# Patient Record
Sex: Female | Born: 1965 | Race: White | Hispanic: No | Marital: Single | Smoking: Never smoker
Health system: Southern US, Community
[De-identification: ages and names within clinical notes are randomized; demographics above are authoritative.]

## PROBLEM LIST (undated history)

## (undated) DIAGNOSIS — L309 Dermatitis, unspecified: Secondary | ICD-10-CM

## (undated) DIAGNOSIS — N63 Unspecified lump in unspecified breast: Secondary | ICD-10-CM

## (undated) DIAGNOSIS — R319 Hematuria, unspecified: Secondary | ICD-10-CM

## (undated) DIAGNOSIS — I10 Essential (primary) hypertension: Secondary | ICD-10-CM

## (undated) DIAGNOSIS — E785 Hyperlipidemia, unspecified: Secondary | ICD-10-CM

## (undated) DIAGNOSIS — K219 Gastro-esophageal reflux disease without esophagitis: Secondary | ICD-10-CM

## (undated) DIAGNOSIS — E8881 Metabolic syndrome: Secondary | ICD-10-CM

## (undated) DIAGNOSIS — R3129 Other microscopic hematuria: Secondary | ICD-10-CM

## (undated) DIAGNOSIS — N200 Calculus of kidney: Secondary | ICD-10-CM

## (undated) DIAGNOSIS — T7840XA Allergy, unspecified, initial encounter: Secondary | ICD-10-CM

## (undated) DIAGNOSIS — D649 Anemia, unspecified: Secondary | ICD-10-CM

## (undated) HISTORY — DX: Anemia, unspecified: D64.9

## (undated) HISTORY — DX: Hematuria, unspecified: R31.9

## (undated) HISTORY — DX: Metabolic syndrome: E88.810

## (undated) HISTORY — DX: Calculus of kidney: N20.0

## (undated) HISTORY — DX: Other microscopic hematuria: R31.29

## (undated) HISTORY — DX: Hyperlipidemia, unspecified: E78.5

## (undated) HISTORY — DX: Unspecified lump in unspecified breast: N63.0

## (undated) HISTORY — DX: Metabolic syndrome: E88.81

## (undated) HISTORY — DX: Dermatitis, unspecified: L30.9

## (undated) HISTORY — DX: Gastro-esophageal reflux disease without esophagitis: K21.9

## (undated) HISTORY — PX: URETHRAL DILATION: SUR417

## (undated) HISTORY — DX: Essential (primary) hypertension: I10

## (undated) HISTORY — DX: Allergy, unspecified, initial encounter: T78.40XA

---

## 2005-07-07 ENCOUNTER — Ambulatory Visit: Payer: Self-pay | Admitting: Internal Medicine

## 2005-12-29 ENCOUNTER — Ambulatory Visit: Payer: Self-pay | Admitting: General Practice

## 2006-01-29 ENCOUNTER — Ambulatory Visit: Payer: Self-pay | Admitting: General Practice

## 2006-02-28 ENCOUNTER — Ambulatory Visit: Payer: Self-pay | Admitting: General Practice

## 2006-03-31 ENCOUNTER — Ambulatory Visit: Payer: Self-pay | Admitting: General Practice

## 2006-05-01 ENCOUNTER — Ambulatory Visit: Payer: Self-pay | Admitting: General Practice

## 2006-05-11 ENCOUNTER — Encounter: Payer: Self-pay | Admitting: General Practice

## 2006-05-31 ENCOUNTER — Encounter: Payer: Self-pay | Admitting: General Practice

## 2006-07-01 ENCOUNTER — Encounter: Payer: Self-pay | Admitting: General Practice

## 2006-07-13 ENCOUNTER — Ambulatory Visit: Payer: Self-pay | Admitting: Internal Medicine

## 2006-09-17 ENCOUNTER — Ambulatory Visit: Payer: Self-pay | Admitting: Internal Medicine

## 2006-12-01 ENCOUNTER — Ambulatory Visit: Payer: Self-pay | Admitting: Unknown Physician Specialty

## 2007-02-04 ENCOUNTER — Ambulatory Visit: Payer: Self-pay | Admitting: Internal Medicine

## 2007-05-10 ENCOUNTER — Ambulatory Visit: Payer: Self-pay | Admitting: Internal Medicine

## 2007-08-31 ENCOUNTER — Ambulatory Visit: Payer: Self-pay

## 2008-04-20 ENCOUNTER — Ambulatory Visit: Payer: Self-pay | Admitting: Internal Medicine

## 2008-05-01 ENCOUNTER — Ambulatory Visit: Payer: Self-pay | Admitting: Internal Medicine

## 2008-06-08 ENCOUNTER — Ambulatory Visit: Payer: Self-pay | Admitting: Internal Medicine

## 2008-10-16 ENCOUNTER — Ambulatory Visit: Payer: Self-pay

## 2008-10-18 ENCOUNTER — Ambulatory Visit: Payer: Self-pay | Admitting: Internal Medicine

## 2008-10-26 ENCOUNTER — Ambulatory Visit: Payer: Self-pay | Admitting: Internal Medicine

## 2008-12-20 ENCOUNTER — Ambulatory Visit: Payer: Self-pay | Admitting: Internal Medicine

## 2009-01-09 ENCOUNTER — Ambulatory Visit: Payer: Self-pay | Admitting: Internal Medicine

## 2009-02-14 ENCOUNTER — Ambulatory Visit: Payer: Self-pay | Admitting: Internal Medicine

## 2009-04-23 ENCOUNTER — Other Ambulatory Visit: Payer: Self-pay | Admitting: Internal Medicine

## 2010-05-31 LAB — HM PAP SMEAR: HM PAP: NORMAL

## 2010-06-04 DIAGNOSIS — N63 Unspecified lump in unspecified breast: Secondary | ICD-10-CM | POA: Insufficient documentation

## 2010-06-04 DIAGNOSIS — R3129 Other microscopic hematuria: Secondary | ICD-10-CM | POA: Insufficient documentation

## 2010-06-05 ENCOUNTER — Ambulatory Visit: Payer: Self-pay | Admitting: Family Medicine

## 2011-08-03 ENCOUNTER — Ambulatory Visit: Payer: Self-pay | Admitting: General Practice

## 2011-08-17 ENCOUNTER — Ambulatory Visit: Payer: Self-pay | Admitting: Family Medicine

## 2011-09-01 ENCOUNTER — Ambulatory Visit: Payer: Self-pay | Admitting: General Practice

## 2011-10-02 ENCOUNTER — Ambulatory Visit: Payer: Self-pay | Admitting: General Practice

## 2011-10-30 ENCOUNTER — Ambulatory Visit: Payer: Self-pay | Admitting: General Practice

## 2011-12-03 ENCOUNTER — Ambulatory Visit: Payer: Self-pay | Admitting: General Practice

## 2011-12-30 ENCOUNTER — Ambulatory Visit: Payer: Self-pay | Admitting: General Practice

## 2012-02-04 ENCOUNTER — Ambulatory Visit: Payer: Self-pay | Admitting: General Practice

## 2012-02-29 ENCOUNTER — Ambulatory Visit: Payer: Self-pay | Admitting: General Practice

## 2012-08-22 ENCOUNTER — Ambulatory Visit: Payer: Self-pay | Admitting: Family Medicine

## 2013-08-28 ENCOUNTER — Ambulatory Visit: Payer: Self-pay | Admitting: Family Medicine

## 2013-08-28 LAB — HM MAMMOGRAPHY: HM Mammogram: NORMAL

## 2013-11-02 LAB — HEMOGLOBIN A1C: Hgb A1c MFr Bld: 5.6 % (ref 4.0–6.0)

## 2013-11-02 LAB — LIPID PANEL
Cholesterol: 194 mg/dL (ref 0–200)
HDL: 42 mg/dL (ref 35–70)
LDL CALC: 129 mg/dL
Triglycerides: 115 mg/dL (ref 40–160)

## 2014-06-04 ENCOUNTER — Ambulatory Visit: Payer: Self-pay | Admitting: Urology

## 2015-02-24 ENCOUNTER — Encounter: Payer: Self-pay | Admitting: Family Medicine

## 2015-02-24 DIAGNOSIS — E8881 Metabolic syndrome: Secondary | ICD-10-CM | POA: Insufficient documentation

## 2015-02-24 DIAGNOSIS — K219 Gastro-esophageal reflux disease without esophagitis: Secondary | ICD-10-CM | POA: Insufficient documentation

## 2015-02-24 DIAGNOSIS — Z862 Personal history of diseases of the blood and blood-forming organs and certain disorders involving the immune mechanism: Secondary | ICD-10-CM | POA: Insufficient documentation

## 2015-02-24 DIAGNOSIS — E785 Hyperlipidemia, unspecified: Secondary | ICD-10-CM | POA: Insufficient documentation

## 2015-02-24 DIAGNOSIS — I10 Essential (primary) hypertension: Secondary | ICD-10-CM | POA: Insufficient documentation

## 2015-02-24 DIAGNOSIS — E66811 Obesity, class 1: Secondary | ICD-10-CM | POA: Insufficient documentation

## 2015-02-24 DIAGNOSIS — E669 Obesity, unspecified: Secondary | ICD-10-CM | POA: Insufficient documentation

## 2015-02-24 DIAGNOSIS — L309 Dermatitis, unspecified: Secondary | ICD-10-CM | POA: Insufficient documentation

## 2015-02-24 DIAGNOSIS — M5440 Lumbago with sciatica, unspecified side: Secondary | ICD-10-CM | POA: Insufficient documentation

## 2015-02-24 DIAGNOSIS — E611 Iron deficiency: Secondary | ICD-10-CM | POA: Insufficient documentation

## 2015-02-24 DIAGNOSIS — J309 Allergic rhinitis, unspecified: Secondary | ICD-10-CM | POA: Insufficient documentation

## 2015-02-27 ENCOUNTER — Ambulatory Visit (INDEPENDENT_AMBULATORY_CARE_PROVIDER_SITE_OTHER): Payer: 59 | Admitting: Family Medicine

## 2015-02-27 ENCOUNTER — Encounter: Payer: Self-pay | Admitting: Family Medicine

## 2015-02-27 ENCOUNTER — Other Ambulatory Visit: Payer: Self-pay | Admitting: Family Medicine

## 2015-02-27 VITALS — BP 120/74 | HR 83 | Temp 97.8°F | Resp 16 | Ht 63.25 in | Wt 180.4 lb

## 2015-02-27 DIAGNOSIS — Z01419 Encounter for gynecological examination (general) (routine) without abnormal findings: Secondary | ICD-10-CM

## 2015-02-27 DIAGNOSIS — E611 Iron deficiency: Secondary | ICD-10-CM

## 2015-02-27 DIAGNOSIS — Z1239 Encounter for other screening for malignant neoplasm of breast: Secondary | ICD-10-CM

## 2015-02-27 DIAGNOSIS — Z0000 Encounter for general adult medical examination without abnormal findings: Secondary | ICD-10-CM

## 2015-02-27 DIAGNOSIS — E785 Hyperlipidemia, unspecified: Secondary | ICD-10-CM

## 2015-02-27 DIAGNOSIS — Z124 Encounter for screening for malignant neoplasm of cervix: Secondary | ICD-10-CM

## 2015-02-27 DIAGNOSIS — I10 Essential (primary) hypertension: Secondary | ICD-10-CM

## 2015-02-27 DIAGNOSIS — E8881 Metabolic syndrome: Secondary | ICD-10-CM | POA: Diagnosis not present

## 2015-02-27 DIAGNOSIS — R5383 Other fatigue: Secondary | ICD-10-CM

## 2015-02-27 MED ORDER — METOPROLOL SUCCINATE ER 25 MG PO TB24: 25.0000 mg | ORAL_TABLET | Freq: Every day | ORAL | Status: DC

## 2015-02-27 NOTE — Patient Instructions (Signed)
Discussed importance of 150 minutes of physical activity weekly, eat two servings of fish weekly, eat one serving of tree nuts ( cashews, pistachios, pecans, almonds..) every other day, eat 6 servings of fruit/vegetables daily and drink plenty of water and avoid sweet beverages. 

## 2015-02-27 NOTE — Progress Notes (Signed)
Name: Sydney [name]   MRN: [id number]    DOB: Oct 08, 1965   Date:02/27/2015       Progress Note  Subjective  Chief Complaint  Chief Complaint  Patient presents with  . Annual Exam    HPI  Well Woman: she is feeling well. She joined [employer] today, trying to lose weight. Mild low back pain started yesterday no radiculitis, sees chiropractor prn. She has a history of menorrhagia and iron deficiency anemia, but no cycles since April 2016 off supplementations. She has been very busy and is feeling tired.   Patient Active Problem List   Diagnosis Date Noted  . Low back pain with sciatica 02/24/2015  . Dyslipidemia 02/24/2015  . Dermatitis, eczematoid 02/24/2015  . Acid reflux 02/24/2015  . Benign hypertension 02/24/2015  . Iron deficiency 02/24/2015  . Dysmetabolic syndrome 31/49/7026  . Obesity (BMI 30.0-34.9) 02/24/2015  . Allergic rhinitis 02/24/2015  . Hematuria, microscopic 06/04/2010    Past Surgical History  Procedure Laterality Date  . Urethral dilation  Age 20    Family History  Problem Relation Age of Onset  . Hypertension Mother   . Hyperlipidemia Mother   . Cancer Father     Prostate, Multiple Myeloma  . Diabetes Father   . CAD Father   . Psoriasis Father   . Asthma Sister   . Thyroid disease Sister   . Diabetes Sister     History   Social History  . Marital Status: Single    Spouse Name: N/A  . Number of Children: N/A  . Years of Education: N/A   Occupational History  . Not on file.   Social History Main Topics  . Smoking status: Never Smoker   . Smokeless tobacco: Never Used  . Alcohol Use: No  . Drug Use: No  . Sexual Activity: Not Currently   Other Topics Concern  . Not on file   Social History Narrative     Current outpatient prescriptions:  .  B COMPLEX VITAMINS ER PO, Take by mouth., Disp: , Rfl:  .  Clindamycin-Benzoyl Per, Refr, (DUAC) gel, DUAC, 1-5% (External Gel) - Historical Medication  apply daily (1-5 %)  Active Comments: Dr. Tyler Deis, Disp: , Rfl:  .  Cranberry Fruit 475 MG CAPS, Take by mouth., Disp: , Rfl:  .  fluticasone (FLONASE) 50 MCG/ACT nasal spray, Place 2 sprays into the nose at bedtime., Disp: , Rfl:  .  Lutein 20 MG TABS, Take 1 tablet by mouth daily., Disp: , Rfl:  .  metoprolol succinate (TOPROL-XL) 25 MG 24 hr tablet, Take 1 tablet (25 mg total) by mouth daily., Disp: 90 tablet, Rfl: 1 .  Multiple Minerals-Vitamins (CALCIUM CITRATE PLUS) TABS, Take 1 tablet by mouth daily., Disp: , Rfl:  .  Multiple Vitamins-Minerals (CENTRUM ULTRA WOMENS) TABS, Take 1 tablet by mouth daily., Disp: , Rfl:   Allergies  Allergen Reactions  . Amoxicillin   . Sulfa Antibiotics      ROS  Constitutional: Negative for fever or significant weight change.  Respiratory: Negative for cough and shortness of breath.   Cardiovascular: Negative for chest pain or palpitations.  Gastrointestinal: Negative for abdominal pain, no bowel changes.  Musculoskeletal: Negative for gait problem or joint swelling. Back pain  Skin: Negative for rash.  Neurological: Negative for dizziness or headache.  No other specific complaints in a complete review of systems (except as listed in HPI above).   Objective  Filed Vitals:   02/27/15 1159  BP: 120/74  Pulse: 83  Temp: 97.8 F (36.6 C)  TempSrc: Oral  Resp: 16  Height: 5' 3.25" (1.607 m)  Weight: 180 lb 6.4 oz (81.829 kg)  SpO2: 97%    Body mass index is 31.69 kg/(m^2).  Physical Exam  Constitutional: Patient appears well-developed and well-nourished. No distress.  HENT: Head: Normocephalic and atraumatic. Ears: B TMs ok, no erythema or effusion; Nose: Nose normal. Mouth/Throat: Oropharynx is clear and moist. No oropharyngeal exudate.  Eyes: Conjunctivae and EOM are normal. Pupils are equal, round, and reactive to light. No scleral icterus.  Neck: Normal range of motion. Neck supple. No JVD present. No thyromegaly present.  Cardiovascular: Normal  rate, regular rhythm and normal heart sounds.  No murmur heard. No BLE edema. Pulmonary/Chest: Effort normal and breath sounds normal. No respiratory distress. Abdominal: Soft. Bowel sounds are normal, no distension. There is no tenderness. no masses Breast: no lumps or masses, no nipple discharge or rashes FEMALE GENITALIA:  External genitalia normal External urethra normal Vaginal vault normal without discharge or lesions Cervix normal without discharge or lesions Bimanual exam normal without masses RECTAL:  hemorrhoids Musculoskeletal: Normal range of motion, no joint effusions. No gross deformities Neurological: he is alert and oriented to person, place, and time. No cranial nerve deficit. Coordination, balance, strength, speech and gait are normal.  Skin: Skin is warm and dry. No rash noted. No erythema.  Psychiatric: Patient has a normal mood and affect. behavior is normal. Judgment and thought content normal.    PHQ2/9: Depression screen PHQ 2/9 02/27/2015  Decreased Interest 0  Down, Depressed, Hopeless 0  PHQ - 2 Score 0    Fall Risk: Fall Risk  02/27/2015  Falls in the past year? No     Assessment & Plan  1. Well woman exam Discussed importance of 150 minutes of physical activity weekly, eat two servings of fish weekly, eat one serving of tree nuts ( cashews, pistachios, pecans, almonds.Marland Kitchen) every other day, eat 6 servings of fruit/vegetables daily and drink plenty of water and avoid sweet beverages.   2. Cervical cancer screening  - Cytology - PAP  3. Breast cancer screening  - MM Digital Screening; Future  4. Dyslipidemia  - Lipid Profile  5. Benign hypertension  - metoprolol succinate (TOPROL-XL) 25 MG 24 hr tablet; Take 1 tablet (25 mg total) by mouth daily.  Dispense: 90 tablet; Refill: 1  6. Iron deficiency  - CBC with Differential - Comprehensive Metabolic Panel (CMET) - Iron - Iron Binding Cap (TIBC) - Ferritin  7. Dysmetabolic syndrome  -  HgB A1c  8. Other fatigue  - Vitamin D (25 hydroxy) - TSH

## 2015-03-06 LAB — PAP LB, HPV-H+LR
HPV DNA High Risk: NEGATIVE
HPV DNA Low Risk: NEGATIVE
PAP Smear Comment: 0

## 2015-03-07 ENCOUNTER — Telehealth: Payer: Self-pay

## 2015-03-07 NOTE — Telephone Encounter (Signed)
Vm was full unable to leave message about patient pap smear.

## 2015-03-07 NOTE — Telephone Encounter (Signed)
-----   Message from Steele Sizer, MD sent at 03/07/2015  1:24 PM EDT ----- Pap smear within normal limits Please notify patient, thank you

## 2015-03-11 NOTE — Progress Notes (Signed)
Patient notified

## 2015-03-28 ENCOUNTER — Ambulatory Visit
Admission: RE | Admit: 2015-03-28 | Discharge: 2015-03-28 | Disposition: A | Discharge status: Home / Self Care | Payer: 59 | Source: Ambulatory Visit | Attending: Family Medicine | Admitting: Family Medicine

## 2015-03-28 DIAGNOSIS — Z1231 Encounter for screening mammogram for malignant neoplasm of breast: Secondary | ICD-10-CM | POA: Insufficient documentation

## 2015-03-28 DIAGNOSIS — Z1239 Encounter for other screening for malignant neoplasm of breast: Secondary | ICD-10-CM

## 2015-03-29 ENCOUNTER — Other Ambulatory Visit: Payer: Self-pay

## 2015-03-29 ENCOUNTER — Telehealth: Payer: Self-pay

## 2015-03-29 DIAGNOSIS — N200 Calculus of kidney: Secondary | ICD-10-CM

## 2015-03-29 LAB — COMPREHENSIVE METABOLIC PANEL
A/G RATIO: 1.8 (ref 1.1–2.5)
ALT: 15 IU/L (ref 0–32)
AST: 16 IU/L (ref 0–40)
Albumin: 4.3 g/dL (ref 3.5–5.5)
Alkaline Phosphatase: 80 IU/L (ref 39–117)
BUN/Creatinine Ratio: 28 — ABNORMAL HIGH (ref 9–23)
BUN: 19 mg/dL (ref 6–24)
Bilirubin Total: 0.4 mg/dL (ref 0.0–1.2)
CHLORIDE: 100 mmol/L (ref 97–108)
CO2: 23 mmol/L (ref 18–29)
Calcium: 8.9 mg/dL (ref 8.7–10.2)
Creatinine, Ser: 0.69 mg/dL (ref 0.57–1.00)
GFR calc Af Amer: 119 mL/min/{1.73_m2} (ref >59)
GFR, EST NON AFRICAN AMERICAN: 103 mL/min/{1.73_m2} (ref >59)
GLUCOSE: 92 mg/dL (ref 65–99)
Globulin, Total: 2.4 g/dL (ref 1.5–4.5)
POTASSIUM: 4.1 mmol/L (ref 3.5–5.2)
Sodium: 140 mmol/L (ref 134–144)
Total Protein: 6.7 g/dL (ref 6.0–8.5)

## 2015-03-29 LAB — VITAMIN D 25 HYDROXY (VIT D DEFICIENCY, FRACTURES): Vit D, 25-Hydroxy: 37.4 ng/mL (ref 30.0–100.0)

## 2015-03-29 LAB — CBC WITH DIFFERENTIAL/PLATELET
BASOS: 1 %
Basophils Absolute: 0 10*3/uL (ref 0.0–0.2)
EOS (ABSOLUTE): 0.3 10*3/uL (ref 0.0–0.4)
EOS: 5 %
Hematocrit: 36.2 % (ref 34.0–46.6)
Hemoglobin: 12.2 g/dL (ref 11.1–15.9)
IMMATURE GRANULOCYTES: 0 %
Immature Grans (Abs): 0 10*3/uL (ref 0.0–0.1)
LYMPHS: 20 %
Lymphocytes Absolute: 1 10*3/uL (ref 0.7–3.1)
MCH: 29.6 pg (ref 26.6–33.0)
MCHC: 33.7 g/dL (ref 31.5–35.7)
MCV: 88 fL (ref 79–97)
MONOCYTES: 8 %
Monocytes Absolute: 0.4 10*3/uL (ref 0.1–0.9)
Neutrophils Absolute: 3.4 10*3/uL (ref 1.4–7.0)
Neutrophils: 66 %
Platelets: 257 10*3/uL (ref 150–379)
RBC: 4.12 x10E6/uL (ref 3.77–5.28)
RDW: 14.2 % (ref 12.3–15.4)
WBC: 5.2 10*3/uL (ref 3.4–10.8)

## 2015-03-29 LAB — HEMOGLOBIN A1C
Est. average glucose Bld gHb Est-mCnc: 117 mg/dL
Hgb A1c MFr Bld: 5.7 % — ABNORMAL HIGH (ref 4.8–5.6)

## 2015-03-29 LAB — IRON AND TIBC
IRON: 55 ug/dL (ref 27–159)
Iron Saturation: 19 % (ref 15–55)
Total Iron Binding Capacity: 295 ug/dL (ref 250–450)
UIBC: 240 ug/dL (ref 131–425)

## 2015-03-29 LAB — LIPID PANEL
Chol/HDL Ratio: 4 ratio units (ref 0.0–4.4)
Cholesterol, Total: 198 mg/dL (ref 100–199)
HDL: 49 mg/dL (ref >39)
LDL Calculated: 130 mg/dL — ABNORMAL HIGH (ref 0–99)
Triglycerides: 93 mg/dL (ref 0–149)
VLDL CHOLESTEROL CAL: 19 mg/dL (ref 5–40)

## 2015-03-29 LAB — TSH: TSH: 2.53 u[IU]/mL (ref 0.450–4.500)

## 2015-03-29 LAB — FERRITIN: Ferritin: 55 ng/mL (ref 15–150)

## 2015-03-29 NOTE — Telephone Encounter (Signed)
-----   Message from Steele Sizer, MD sent at 03/29/2015  7:54 AM EDT ----- Labs reviewed and within normal limits. Based on the results of lipid panel his cardiovascular risk factor in the next 10 years is :1.4 % and she does not need to take statins at this time

## 2015-03-29 NOTE — Telephone Encounter (Signed)
Was unable to leave a vm due to the mailbox was full, I was trying to reach patient to informed her of her lab results.

## 2015-04-02 ENCOUNTER — Telehealth: Payer: Self-pay

## 2015-04-02 NOTE — Telephone Encounter (Signed)
-----   Message from Steele Sizer, MD sent at 03/29/2015  7:54 AM EDT ----- Labs reviewed and within normal limits. Based on the results of lipid panel his cardiovascular risk factor in the next 10 years is :1.4 % and she does not need to take statins at this time

## 2015-04-02 NOTE — Telephone Encounter (Signed)
Unable to reach a message due to vm was full, but mailed patient a letter to call back for results.

## 2015-06-11 ENCOUNTER — Ambulatory Visit
Admission: RE | Admit: 2015-06-11 | Discharge: 2015-06-11 | Disposition: A | Discharge status: Home / Self Care | Payer: 59 | Source: Ambulatory Visit | Attending: Urology | Admitting: Urology

## 2015-06-11 ENCOUNTER — Ambulatory Visit: Payer: 59

## 2015-06-11 ENCOUNTER — Encounter: Payer: Self-pay | Admitting: Urology

## 2015-06-11 DIAGNOSIS — N200 Calculus of kidney: Secondary | ICD-10-CM

## 2015-07-16 ENCOUNTER — Encounter: Payer: Self-pay | Admitting: Urology

## 2015-07-16 ENCOUNTER — Ambulatory Visit: Payer: 59 | Admitting: Urology

## 2015-07-16 ENCOUNTER — Ambulatory Visit (INDEPENDENT_AMBULATORY_CARE_PROVIDER_SITE_OTHER): Payer: 59 | Admitting: Urology

## 2015-07-16 VITALS — BP 133/89 | HR 73 | Ht 63.0 in | Wt 164.8 lb

## 2015-07-16 DIAGNOSIS — R350 Frequency of micturition: Secondary | ICD-10-CM | POA: Diagnosis not present

## 2015-07-16 DIAGNOSIS — N200 Calculus of kidney: Secondary | ICD-10-CM

## 2015-07-16 DIAGNOSIS — R3129 Other microscopic hematuria: Secondary | ICD-10-CM

## 2015-07-16 LAB — MICROSCOPIC EXAMINATION: WBC, UA: NONE SEEN /hpf (ref 0–?)

## 2015-07-16 LAB — URINALYSIS, COMPLETE
Bilirubin, UA: NEGATIVE
GLUCOSE, UA: NEGATIVE
Ketones, UA: NEGATIVE
LEUKOCYTES UA: NEGATIVE
Nitrite, UA: NEGATIVE
PROTEIN UA: NEGATIVE
Specific Gravity, UA: 1.02 (ref 1.005–1.030)
UUROB: 0.2 mg/dL (ref 0.2–1.0)
pH, UA: 7.5 (ref 5.0–7.5)

## 2015-07-16 NOTE — Progress Notes (Signed)
07/16/2015 4:12 PM   Sydney [name] 1966/01/11 [id number]  Referring provider: Steele Sizer, MD [address] [place], [place] [address]  Chief Complaint  Patient presents with  . Nephrolithiasis    1year w/KUB    HPI:  49 year old female who presents today for her 1 year follow-up. She was initially seen last year for a microscopic hematuria workup in 05/2014 which demonstrated an incidental 2 mm left upper pole nonobstructing stone on CT urogram, cystoscopy was negative.  She returns to the office today for routine follow-up.  Today, she denies any issues with urination. She denies gross hematuria, dysuria, or UTIs.  She denies ever previously having passed a kidney stone or flank pain.  She does have some daytime urinary frequency up to hourly at work and is not bothered by this. No urgency or nocturia.  She does try to drink plenty of water throughout the day.  KUB today shows stable punctate 2 mm left upper pole stone, unchanged.   PMH: Past Medical History  Diagnosis Date  . Hypertension   . Metabolic syndrome   . Allergy   . GERD (gastroesophageal reflux disease)   . Hyperlipidemia   . Anemia   . Eczema   . Lump in female breast     seen by Dr. Fleet Contras  . Microscopic hematuria   . Kidney stone on left side   . Hematuria     Surgical History: Past Surgical History  Procedure Laterality Date  . Urethral dilation  Age 35    Home Medications:    Medication List       This list is accurate as of: 07/16/15  4:12 PM.  Always use your most recent med list.               metoprolol succinate 25 MG 24 hr tablet  Commonly known as:  TOPROL-XL  Take 1 tablet (25 mg total) by mouth daily.        Allergies:  Allergies  Allergen Reactions  . Amoxicillin   . Sulfa Antibiotics     Family History: Family History  Problem Relation Age of Onset  . Hypertension Mother   . Hyperlipidemia Mother   . Cancer Father     Prostate,  Multiple Myeloma  . Diabetes Father   . CAD Father   . Psoriasis Father   . Asthma Sister   . Thyroid disease Sister   . Diabetes Sister     Social History:  reports that she has never smoked. She has never used smokeless tobacco. She reports that she does not drink alcohol or use illicit drugs.  ROS: UROLOGY Frequent Urination?: No Hard to postpone urination?: No Burning/pain with urination?: No Get up at night to urinate?: No Leakage of urine?: No Urine stream starts and stops?: No Trouble starting stream?: No Do you have to strain to urinate?: No Blood in urine?: No Urinary tract infection?: No Sexually transmitted disease?: No Injury to kidneys or bladder?: No Painful intercourse?: No Weak stream?: No Currently pregnant?: No Vaginal bleeding?: No Last menstrual period?: 07/10/15  Gastrointestinal Nausea?: No Vomiting?: No Indigestion/heartburn?: No Diarrhea?: No Constipation?: No  Constitutional Fever: No Night sweats?: No Weight loss?: No Fatigue?: No  Skin Skin rash/lesions?: No Itching?: No  Eyes Blurred vision?: No Double vision?: No  Ears/Nose/Throat Sore throat?: No Sinus problems?: No  Hematologic/Lymphatic Swollen glands?: No Easy bruising?: No  Cardiovascular Leg swelling?: No Chest pain?: No  Respiratory Cough?: No Shortness of breath?: No  Endocrine Excessive thirst?: No  Musculoskeletal Back pain?: No Joint pain?: No  Neurological Headaches?: No Dizziness?: No  Psychologic Depression?: No Anxiety?: No  Physical Exam: BP 133/89 mmHg  Pulse 73  Ht _0  (1.6 m)  Wt 164 lb 12.8 oz (74.753 kg)  BMI 29.20 kg/m2  LMP 07/10/2015  Constitutional:  Alert and oriented, No acute distress. HEENT: [place] AT, moist mucus membranes.  Trachea midline, no masses. Cardiovascular: No clubbing, cyanosis, or edema. Respiratory: Normal respiratory effort, no increased work of breathing. GI: Abdomen is soft, nontender, nondistended, no  abdominal masses GU: No CVA tenderness. Skin: No rashes, bruises or suspicious lesions. Neurologic: Grossly intact, no focal deficits, moving all 4 extremities. Psychiatric: Normal mood and affect.  Laboratory Data: Lab Results  Component Value Date   WBC 5.2 03/28/2015   HCT 36.2 03/28/2015    Lab Results  Component Value Date   CREATININE 0.69 03/28/2015    Lab Results  Component Value Date   HGBA1C 5.7* 03/28/2015    Urinalysis UA today with 2+ blood but no microscopic hematuria.  Amorphous sediment noted. Crystals were also present. UA otherwise negative.  Pertinent Imaging: CLINICAL DATA: History of left-sided kidney stones  EXAM: ABDOMEN - 1 VIEW  COMPARISON: Abdominal pelvic CT scan of June 04, 2014  FINDINGS: There is an approximately will 1 x 2 mm calcification that projects over the mid upper pole of the left kidney. No stones are evident overlying the right kidney. There are stable phleboliths in the pelvis bilaterally. The bowel gas pattern is normal. The bony structures exhibit no acute abnormalities.  IMPRESSION: Tiny mid upper pole left-sided kidney stone. No urinary tract stones are observed elsewhere.   Electronically Signed  By: David Martinique M.D.  On: 06/11/2015 13:43  Assessment & Plan:    1. Nephrolithiasis Punctate asymptomatic left upper pole calcification stable from 1 year ago.  We discussed general stone prevention techniques including drinking plenty water with goal of producing 2.5 L urine daily, increased citric acid intake, avoidance of high oxalate containing foods, and decreased salt intake.  Information about dietary recommendations given today.    She was advised to return if she develops any flank pain or any other worrisome symptoms.  - Urinalysis, Complete  2. Urinary frequency Daytime frequency related to fluid intake, minimal bother. No intervention recommended.  3. Microscopic hematuria Status post  workup 05/2014. UA today negative for any microscopic blood. No further workup or surveillance needed.   Return if symptoms worsen or fail to improve.  Hollice Espy, MD  [facility] [address], [place], [place] [address] [phone]

## 2015-09-03 ENCOUNTER — Ambulatory Visit: Payer: 59 | Admitting: Family Medicine

## 2015-10-10 DIAGNOSIS — D1801 Hemangioma of skin and subcutaneous tissue: Secondary | ICD-10-CM | POA: Diagnosis not present

## 2015-10-10 DIAGNOSIS — L609 Nail disorder, unspecified: Secondary | ICD-10-CM | POA: Diagnosis not present

## 2015-10-10 DIAGNOSIS — L813 Cafe au lait spots: Secondary | ICD-10-CM | POA: Diagnosis not present

## 2015-10-22 DIAGNOSIS — M7989 Other specified soft tissue disorders: Secondary | ICD-10-CM | POA: Diagnosis not present

## 2015-10-22 DIAGNOSIS — M79609 Pain in unspecified limb: Secondary | ICD-10-CM | POA: Diagnosis not present

## 2015-10-22 DIAGNOSIS — I83813 Varicose veins of bilateral lower extremities with pain: Secondary | ICD-10-CM | POA: Diagnosis not present

## 2015-10-23 ENCOUNTER — Other Ambulatory Visit: Payer: Self-pay | Admitting: Family Medicine

## 2015-10-23 NOTE — Telephone Encounter (Signed)
Patient requesting refill. 

## 2015-10-25 NOTE — Telephone Encounter (Signed)
Not able to inform patient that prescriptions are at the pharmacy and that she need to schedule appointment. Mailbox is full

## 2015-12-30 DIAGNOSIS — M79609 Pain in unspecified limb: Secondary | ICD-10-CM | POA: Diagnosis not present

## 2015-12-30 DIAGNOSIS — I83813 Varicose veins of bilateral lower extremities with pain: Secondary | ICD-10-CM | POA: Diagnosis not present

## 2015-12-30 DIAGNOSIS — M7989 Other specified soft tissue disorders: Secondary | ICD-10-CM | POA: Diagnosis not present

## 2016-01-31 ENCOUNTER — Other Ambulatory Visit: Payer: Self-pay | Admitting: Family Medicine

## 2016-01-31 NOTE — Telephone Encounter (Signed)
Patient requesting refill. 

## 2016-02-03 NOTE — Telephone Encounter (Signed)
Tried contacting patient to inform prescription has been sent to pharmacy and that she need to schedule appointment. No answer and mailbox was full.

## 2016-03-12 ENCOUNTER — Other Ambulatory Visit: Payer: Self-pay | Admitting: Family Medicine

## 2016-03-12 NOTE — Telephone Encounter (Signed)
Not able to leave message voicemail full

## 2016-04-16 ENCOUNTER — Other Ambulatory Visit: Payer: Self-pay | Admitting: Family Medicine

## 2016-04-16 NOTE — Telephone Encounter (Signed)
Patient requesting refill of Metoprolol be sent to [facility].

## 2016-06-17 ENCOUNTER — Encounter: Payer: Self-pay | Admitting: Family Medicine

## 2016-06-17 ENCOUNTER — Ambulatory Visit (INDEPENDENT_AMBULATORY_CARE_PROVIDER_SITE_OTHER): Payer: 59 | Admitting: Family Medicine

## 2016-06-17 VITALS — BP 134/78 | HR 70 | Temp 98.2°F | Resp 16 | Ht 63.0 in | Wt 152.7 lb

## 2016-06-17 DIAGNOSIS — Z23 Encounter for immunization: Secondary | ICD-10-CM

## 2016-06-17 DIAGNOSIS — Z8639 Personal history of other endocrine, nutritional and metabolic disease: Secondary | ICD-10-CM

## 2016-06-17 DIAGNOSIS — I10 Essential (primary) hypertension: Secondary | ICD-10-CM

## 2016-06-17 DIAGNOSIS — E785 Hyperlipidemia, unspecified: Secondary | ICD-10-CM

## 2016-06-17 DIAGNOSIS — E8881 Metabolic syndrome: Secondary | ICD-10-CM | POA: Diagnosis not present

## 2016-06-17 DIAGNOSIS — Z01419 Encounter for gynecological examination (general) (routine) without abnormal findings: Secondary | ICD-10-CM | POA: Diagnosis not present

## 2016-06-17 MED ORDER — METOPROLOL SUCCINATE ER 25 MG PO TB24: 25.0000 mg | ORAL_TABLET | Freq: Every day | ORAL | 3 refills | Status: DC

## 2016-06-17 NOTE — Progress Notes (Signed)
Name: Sydney [name]   MRN: [id number]    DOB: June 14, 1966   Date:06/17/2016       Progress Note  Subjective  Chief Complaint  Chief Complaint  Patient presents with  . Annual Exam    HPI  Well Woman: never sexually active, no vaginal discharge, no bladder problems, she thinks it may have been one year since her last menstrual period.   HTN: she has been taking Metoprolol and has noticed some dizziness, it is sporadic, bp is at goal . She is not sure if she drinks enough water, explained that it may help with her dizziness. She denies SOB or chest pain  Dyslipidemia: on diet only, we will recheck labs  Metabolic Metabolic: she denies polyphagia, polyuria or polyphagia. She has lost weight since last visit with weight watchers and we will recheck labs  Patient Active Problem List   Diagnosis Date Noted  . Low back pain with sciatica 02/24/2015  . Dyslipidemia 02/24/2015  . Dermatitis, eczematoid 02/24/2015  . Acid reflux 02/24/2015  . Benign hypertension 02/24/2015  . Iron deficiency 02/24/2015  . Dysmetabolic syndrome 99/37/1696  . Obesity (BMI 30.0-34.9) 02/24/2015  . Allergic rhinitis 02/24/2015  . Hematuria, microscopic 06/04/2010    Past Surgical History:  Procedure Laterality Date  . URETHRAL DILATION  Age 50    Family History  Problem Relation Age of Onset  . Hypertension Mother   . Hyperlipidemia Mother   . Dementia Mother   . Diabetes Father   . CAD Father   . Psoriasis Father   . Heart disease Father   . Cancer Father     Prostate and Multiple Myeloma  . Asthma Sister   . Thyroid disease Sister   . Diabetes Sister     Social History   Social History  . Marital status: Single    Spouse name: N/A  . Number of children: N/A  . Years of education: N/A   Occupational History  . Not on file.   Social History Main Topics  . Smoking status: Never Smoker  . Smokeless tobacco: Never Used  . Alcohol use No  . Drug use: No  . Sexual activity:  Not Currently   Other Topics Concern  . Not on file   Social History Narrative  . No narrative on file     Current Outpatient Prescriptions:  .  Calcium-Phosphorus-Vitamin D (CITRACAL +D3 PO), Take 2 tablets by mouth daily., Disp: , Rfl:  .  ibuprofen (ADVIL,MOTRIN) 100 MG tablet, Take 100 mg by mouth 2 (two) times daily as needed for fever., Disp: , Rfl:  .  metoprolol succinate (TOPROL-XL) 25 MG 24 hr tablet, Take 1 tablet (25 mg total) by mouth daily., Disp: 90 tablet, Rfl: 3 .  Multiple Vitamins-Minerals (CENTRUM SILVER ULTRA WOMENS PO), Take 1 tablet by mouth daily., Disp: , Rfl:   Allergies  Allergen Reactions  . Amoxicillin   . Sulfa Antibiotics      ROS  Constitutional: Negative for fever, positive for weight change - she joined weight wat[employer].  Respiratory: Negative for cough and shortness of breath.   Cardiovascular: Negative for chest pain or palpitations.  Gastrointestinal: Negative for abdominal pain, no bowel changes.  Musculoskeletal: Negative for gait problem or joint swelling.  Skin: Negative for rash.  Neurological: Negative for dizziness or headache.  No other specific complaints in a complete review of systems (except as listed in HPI above).  Objective  Vitals:   06/17/16 1417  BP: 134/78  Pulse: 70  Resp: 16  Temp: 98.2 F (36.8 C)  TempSrc: Oral  SpO2: 97%  Weight: 152 lb 11.2 oz (69.3 kg)  Height: '5\' 3"'$  (1.6 m)    Body mass index is 27.05 kg/m.  Physical Exam  Constitutional: Patient appears well-developed and well-nourished. No distress.  HENT: Head: Normocephalic and atraumatic. Ears: B TMs ok, no erythema or effusion; Nose: Nose normal. Mouth/Throat: Oropharynx is clear and moist. No oropharyngeal exudate.  Eyes: Conjunctivae and EOM are normal. Pupils are equal, round, and reactive to light. No scleral icterus.  Neck: Normal range of motion. Neck supple. No JVD present. No thyromegaly present.  Cardiovascular: Normal rate, regular  rhythm and normal heart sounds.  No murmur heard. No BLE edema. Pulmonary/Chest: Effort normal and breath sounds normal. No respiratory distress. Abdominal: Soft. Bowel sounds are normal, no distension. There is no tenderness. no masses Breast: no lumps or masses, no nipple discharge or rashes FEMALE GENITALIA:  External genitalia normal External urethra normal Pelvic not done RECTAL: not done Musculoskeletal: Normal range of motion, no joint effusions. No gross deformities Neurological: he is alert and oriented to person, place, and time. No cranial nerve deficit. Coordination, balance, strength, speech and gait are normal.  Skin: Skin is warm and dry. No rash noted. No erythema.  Psychiatric: Patient has a normal mood and affect. behavior is normal. Judgment and thought content normal.  PHQ2/9: Depression screen [facility] 2/9 06/17/2016 02/27/2015  Decreased Interest 0 0  Down, Depressed, Hopeless 0 0  PHQ - 2 Score 0 0     Fall Risk: Fall Risk  06/17/2016 02/27/2015  Falls in the past year? No No     Functional Status Survey: Is the patient deaf or have difficulty hearing?: No Does the patient have difficulty seeing, even when wearing glasses/contacts?: No Does the patient have difficulty concentrating, remembering, or making decisions?: No Does the patient have difficulty walking or climbing stairs?: No Does the patient have difficulty dressing or bathing?: No Does the patient have difficulty doing errands alone such as visiting a doctor's office or shopping?: No    Assessment & Plan  1. Well woman exam  Discussed importance of 150 minutes of physical activity weekly, eat two servings of fish weekly, eat one serving of tree nuts ( cashews, pistachios, pecans, almonds.Marland Kitchen) every other day, eat 6 servings of fruit/vegetables daily and drink plenty of water and avoid sweet beverages.  - Vitamin B12 - VITAMIN D 25 Hydroxy (Vit-D Deficiency, Fractures) - TSH  2. Dyslipidemia  -  Lipid panel  3. Benign hypertension  - metoprolol succinate (TOPROL-XL) 25 MG 24 hr tablet; Take 1 tablet (25 mg total) by mouth daily.  Dispense: 90 tablet; Refill: 3 - COMPLETE METABOLIC PANEL WITH GFR  4. Dysmetabolic syndrome  - Hemoglobin A1c  5. History of iron deficiency  - CBC with Differential/Platelet  6. Need for diphtheria-tetanus-pertussis (Tdap) vaccine  - Tdap vaccine greater than or equal to 7yo IM

## 2016-07-13 ENCOUNTER — Other Ambulatory Visit: Payer: Self-pay | Admitting: Family Medicine

## 2016-07-13 DIAGNOSIS — E785 Hyperlipidemia, unspecified: Secondary | ICD-10-CM | POA: Diagnosis not present

## 2016-07-13 DIAGNOSIS — E8881 Metabolic syndrome: Secondary | ICD-10-CM | POA: Diagnosis not present

## 2016-07-13 DIAGNOSIS — Z8639 Personal history of other endocrine, nutritional and metabolic disease: Secondary | ICD-10-CM | POA: Diagnosis not present

## 2016-07-13 DIAGNOSIS — I10 Essential (primary) hypertension: Secondary | ICD-10-CM | POA: Diagnosis not present

## 2016-07-13 DIAGNOSIS — Z01419 Encounter for gynecological examination (general) (routine) without abnormal findings: Secondary | ICD-10-CM | POA: Diagnosis not present

## 2016-07-13 LAB — LIPID PANEL
CHOL/HDL RATIO: 3.4 ratio (ref <5.0)
Cholesterol: 240 mg/dL — ABNORMAL HIGH (ref <200)
HDL: 71 mg/dL (ref >50)
LDL Cholesterol: 156 mg/dL — ABNORMAL HIGH (ref <100)
Triglycerides: 64 mg/dL (ref <150)
VLDL: 13 mg/dL (ref <30)

## 2016-07-13 LAB — COMPLETE METABOLIC PANEL WITH GFR
ALBUMIN: 4 g/dL (ref 3.6–5.1)
ALK PHOS: 67 U/L (ref 33–115)
ALT: 25 U/L (ref 6–29)
AST: 25 U/L (ref 10–35)
BILIRUBIN TOTAL: 0.6 mg/dL (ref 0.2–1.2)
BUN: 26 mg/dL — ABNORMAL HIGH (ref 7–25)
CO2: 30 mmol/L (ref 20–31)
Calcium: 9.3 mg/dL (ref 8.6–10.2)
Chloride: 103 mmol/L (ref 98–110)
Creat: 0.68 mg/dL (ref 0.50–1.10)
GLUCOSE: 81 mg/dL (ref 65–99)
Potassium: 3.8 mmol/L (ref 3.5–5.3)
Sodium: 140 mmol/L (ref 135–146)
TOTAL PROTEIN: 6.5 g/dL (ref 6.1–8.1)

## 2016-07-13 LAB — CBC WITH DIFFERENTIAL/PLATELET
BASOS ABS: 38 {cells}/uL (ref 0–200)
Basophils Relative: 1 %
EOS PCT: 6 %
Eosinophils Absolute: 228 cells/uL (ref 15–500)
HCT: 38.9 % (ref 35.0–45.0)
Hemoglobin: 12.6 g/dL (ref 11.7–15.5)
LYMPHS PCT: 29 %
Lymphs Abs: 1102 cells/uL (ref 850–3900)
MCH: 30.2 pg (ref 27.0–33.0)
MCHC: 32.4 g/dL (ref 32.0–36.0)
MCV: 93.3 fL (ref 80.0–100.0)
MONOS PCT: 9 %
MPV: 11.5 fL (ref 7.5–12.5)
Monocytes Absolute: 342 cells/uL (ref 200–950)
NEUTROS ABS: 2090 {cells}/uL (ref 1500–7800)
Neutrophils Relative %: 55 %
PLATELETS: 212 10*3/uL (ref 140–400)
RBC: 4.17 MIL/uL (ref 3.80–5.10)
RDW: 13.4 % (ref 11.0–15.0)
WBC: 3.8 10*3/uL (ref 3.8–10.8)

## 2016-07-13 LAB — TSH: TSH: 1.87 m[IU]/L

## 2016-07-13 LAB — VITAMIN B12: Vitamin B-12: 1321 pg/mL — ABNORMAL HIGH (ref 200–1100)

## 2016-07-13 LAB — HEMOGLOBIN A1C
Hgb A1c MFr Bld: 5.5 % (ref <5.7)
MEAN PLASMA GLUCOSE: 111 mg/dL

## 2016-07-14 LAB — VITAMIN D 25 HYDROXY (VIT D DEFICIENCY, FRACTURES): VIT D 25 HYDROXY: 41 ng/mL (ref 30–100)

## 2016-08-04 DIAGNOSIS — H900 Conductive hearing loss, bilateral: Secondary | ICD-10-CM | POA: Diagnosis not present

## 2016-08-04 DIAGNOSIS — H6123 Impacted cerumen, bilateral: Secondary | ICD-10-CM | POA: Diagnosis not present

## 2016-08-19 ENCOUNTER — Other Ambulatory Visit: Payer: Self-pay | Admitting: Family Medicine

## 2016-08-19 NOTE — Telephone Encounter (Signed)
Patient requesting refill of Clindamycin gel to [facility].

## 2016-10-07 DIAGNOSIS — H5213 Myopia, bilateral: Secondary | ICD-10-CM | POA: Diagnosis not present

## 2016-10-09 DIAGNOSIS — D225 Melanocytic nevi of trunk: Secondary | ICD-10-CM | POA: Diagnosis not present

## 2016-10-09 DIAGNOSIS — D2272 Melanocytic nevi of left lower limb, including hip: Secondary | ICD-10-CM | POA: Diagnosis not present

## 2016-10-09 DIAGNOSIS — D1801 Hemangioma of skin and subcutaneous tissue: Secondary | ICD-10-CM | POA: Diagnosis not present

## 2016-10-09 DIAGNOSIS — D2261 Melanocytic nevi of right upper limb, including shoulder: Secondary | ICD-10-CM | POA: Diagnosis not present

## 2016-12-11 DIAGNOSIS — M9902 Segmental and somatic dysfunction of thoracic region: Secondary | ICD-10-CM | POA: Diagnosis not present

## 2016-12-11 DIAGNOSIS — M5134 Other intervertebral disc degeneration, thoracic region: Secondary | ICD-10-CM | POA: Diagnosis not present

## 2016-12-11 DIAGNOSIS — M546 Pain in thoracic spine: Secondary | ICD-10-CM | POA: Diagnosis not present

## 2016-12-11 DIAGNOSIS — M9903 Segmental and somatic dysfunction of lumbar region: Secondary | ICD-10-CM | POA: Diagnosis not present

## 2016-12-14 DIAGNOSIS — M9903 Segmental and somatic dysfunction of lumbar region: Secondary | ICD-10-CM | POA: Diagnosis not present

## 2016-12-14 DIAGNOSIS — M5134 Other intervertebral disc degeneration, thoracic region: Secondary | ICD-10-CM | POA: Diagnosis not present

## 2016-12-14 DIAGNOSIS — M546 Pain in thoracic spine: Secondary | ICD-10-CM | POA: Diagnosis not present

## 2016-12-14 DIAGNOSIS — M9902 Segmental and somatic dysfunction of thoracic region: Secondary | ICD-10-CM | POA: Diagnosis not present

## 2016-12-18 DIAGNOSIS — M9902 Segmental and somatic dysfunction of thoracic region: Secondary | ICD-10-CM | POA: Diagnosis not present

## 2016-12-18 DIAGNOSIS — M546 Pain in thoracic spine: Secondary | ICD-10-CM | POA: Diagnosis not present

## 2016-12-18 DIAGNOSIS — M9903 Segmental and somatic dysfunction of lumbar region: Secondary | ICD-10-CM | POA: Diagnosis not present

## 2016-12-18 DIAGNOSIS — M5134 Other intervertebral disc degeneration, thoracic region: Secondary | ICD-10-CM | POA: Diagnosis not present

## 2016-12-21 DIAGNOSIS — M9903 Segmental and somatic dysfunction of lumbar region: Secondary | ICD-10-CM | POA: Diagnosis not present

## 2016-12-21 DIAGNOSIS — M9902 Segmental and somatic dysfunction of thoracic region: Secondary | ICD-10-CM | POA: Diagnosis not present

## 2016-12-21 DIAGNOSIS — M546 Pain in thoracic spine: Secondary | ICD-10-CM | POA: Diagnosis not present

## 2016-12-21 DIAGNOSIS — M5134 Other intervertebral disc degeneration, thoracic region: Secondary | ICD-10-CM | POA: Diagnosis not present

## 2016-12-23 DIAGNOSIS — M9903 Segmental and somatic dysfunction of lumbar region: Secondary | ICD-10-CM | POA: Diagnosis not present

## 2016-12-23 DIAGNOSIS — M9902 Segmental and somatic dysfunction of thoracic region: Secondary | ICD-10-CM | POA: Diagnosis not present

## 2016-12-23 DIAGNOSIS — M5134 Other intervertebral disc degeneration, thoracic region: Secondary | ICD-10-CM | POA: Diagnosis not present

## 2016-12-23 DIAGNOSIS — M546 Pain in thoracic spine: Secondary | ICD-10-CM | POA: Diagnosis not present

## 2016-12-30 DIAGNOSIS — M9903 Segmental and somatic dysfunction of lumbar region: Secondary | ICD-10-CM | POA: Diagnosis not present

## 2016-12-30 DIAGNOSIS — M5134 Other intervertebral disc degeneration, thoracic region: Secondary | ICD-10-CM | POA: Diagnosis not present

## 2016-12-30 DIAGNOSIS — M9902 Segmental and somatic dysfunction of thoracic region: Secondary | ICD-10-CM | POA: Diagnosis not present

## 2016-12-30 DIAGNOSIS — M546 Pain in thoracic spine: Secondary | ICD-10-CM | POA: Diagnosis not present

## 2017-01-01 DIAGNOSIS — M9903 Segmental and somatic dysfunction of lumbar region: Secondary | ICD-10-CM | POA: Diagnosis not present

## 2017-01-01 DIAGNOSIS — M5134 Other intervertebral disc degeneration, thoracic region: Secondary | ICD-10-CM | POA: Diagnosis not present

## 2017-01-01 DIAGNOSIS — M546 Pain in thoracic spine: Secondary | ICD-10-CM | POA: Diagnosis not present

## 2017-01-01 DIAGNOSIS — M9902 Segmental and somatic dysfunction of thoracic region: Secondary | ICD-10-CM | POA: Diagnosis not present

## 2017-01-04 DIAGNOSIS — M9902 Segmental and somatic dysfunction of thoracic region: Secondary | ICD-10-CM | POA: Diagnosis not present

## 2017-01-04 DIAGNOSIS — M5134 Other intervertebral disc degeneration, thoracic region: Secondary | ICD-10-CM | POA: Diagnosis not present

## 2017-01-04 DIAGNOSIS — M546 Pain in thoracic spine: Secondary | ICD-10-CM | POA: Diagnosis not present

## 2017-01-04 DIAGNOSIS — M9903 Segmental and somatic dysfunction of lumbar region: Secondary | ICD-10-CM | POA: Diagnosis not present

## 2017-01-08 DIAGNOSIS — M546 Pain in thoracic spine: Secondary | ICD-10-CM | POA: Diagnosis not present

## 2017-01-08 DIAGNOSIS — M9902 Segmental and somatic dysfunction of thoracic region: Secondary | ICD-10-CM | POA: Diagnosis not present

## 2017-01-08 DIAGNOSIS — M5134 Other intervertebral disc degeneration, thoracic region: Secondary | ICD-10-CM | POA: Diagnosis not present

## 2017-01-08 DIAGNOSIS — M9903 Segmental and somatic dysfunction of lumbar region: Secondary | ICD-10-CM | POA: Diagnosis not present

## 2017-01-12 DIAGNOSIS — M546 Pain in thoracic spine: Secondary | ICD-10-CM | POA: Diagnosis not present

## 2017-01-12 DIAGNOSIS — M9903 Segmental and somatic dysfunction of lumbar region: Secondary | ICD-10-CM | POA: Diagnosis not present

## 2017-01-12 DIAGNOSIS — M9902 Segmental and somatic dysfunction of thoracic region: Secondary | ICD-10-CM | POA: Diagnosis not present

## 2017-01-12 DIAGNOSIS — M5134 Other intervertebral disc degeneration, thoracic region: Secondary | ICD-10-CM | POA: Diagnosis not present

## 2017-01-15 DIAGNOSIS — M9903 Segmental and somatic dysfunction of lumbar region: Secondary | ICD-10-CM | POA: Diagnosis not present

## 2017-01-15 DIAGNOSIS — M9902 Segmental and somatic dysfunction of thoracic region: Secondary | ICD-10-CM | POA: Diagnosis not present

## 2017-01-15 DIAGNOSIS — M5134 Other intervertebral disc degeneration, thoracic region: Secondary | ICD-10-CM | POA: Diagnosis not present

## 2017-01-15 DIAGNOSIS — M546 Pain in thoracic spine: Secondary | ICD-10-CM | POA: Diagnosis not present

## 2017-01-18 DIAGNOSIS — M9903 Segmental and somatic dysfunction of lumbar region: Secondary | ICD-10-CM | POA: Diagnosis not present

## 2017-01-18 DIAGNOSIS — M546 Pain in thoracic spine: Secondary | ICD-10-CM | POA: Diagnosis not present

## 2017-01-18 DIAGNOSIS — M9902 Segmental and somatic dysfunction of thoracic region: Secondary | ICD-10-CM | POA: Diagnosis not present

## 2017-01-18 DIAGNOSIS — M5134 Other intervertebral disc degeneration, thoracic region: Secondary | ICD-10-CM | POA: Diagnosis not present

## 2017-02-09 DIAGNOSIS — H900 Conductive hearing loss, bilateral: Secondary | ICD-10-CM | POA: Diagnosis not present

## 2017-02-09 DIAGNOSIS — H6123 Impacted cerumen, bilateral: Secondary | ICD-10-CM | POA: Diagnosis not present

## 2017-04-27 DIAGNOSIS — M9902 Segmental and somatic dysfunction of thoracic region: Secondary | ICD-10-CM | POA: Diagnosis not present

## 2017-04-27 DIAGNOSIS — M546 Pain in thoracic spine: Secondary | ICD-10-CM | POA: Diagnosis not present

## 2017-04-27 DIAGNOSIS — M5134 Other intervertebral disc degeneration, thoracic region: Secondary | ICD-10-CM | POA: Diagnosis not present

## 2017-04-27 DIAGNOSIS — M9903 Segmental and somatic dysfunction of lumbar region: Secondary | ICD-10-CM | POA: Diagnosis not present

## 2017-04-30 DIAGNOSIS — M546 Pain in thoracic spine: Secondary | ICD-10-CM | POA: Diagnosis not present

## 2017-04-30 DIAGNOSIS — M5134 Other intervertebral disc degeneration, thoracic region: Secondary | ICD-10-CM | POA: Diagnosis not present

## 2017-04-30 DIAGNOSIS — M9903 Segmental and somatic dysfunction of lumbar region: Secondary | ICD-10-CM | POA: Diagnosis not present

## 2017-04-30 DIAGNOSIS — M9902 Segmental and somatic dysfunction of thoracic region: Secondary | ICD-10-CM | POA: Diagnosis not present

## 2017-05-05 DIAGNOSIS — M9903 Segmental and somatic dysfunction of lumbar region: Secondary | ICD-10-CM | POA: Diagnosis not present

## 2017-05-05 DIAGNOSIS — M546 Pain in thoracic spine: Secondary | ICD-10-CM | POA: Diagnosis not present

## 2017-05-05 DIAGNOSIS — M5134 Other intervertebral disc degeneration, thoracic region: Secondary | ICD-10-CM | POA: Diagnosis not present

## 2017-05-05 DIAGNOSIS — M9902 Segmental and somatic dysfunction of thoracic region: Secondary | ICD-10-CM | POA: Diagnosis not present

## 2017-05-11 DIAGNOSIS — M546 Pain in thoracic spine: Secondary | ICD-10-CM | POA: Diagnosis not present

## 2017-05-11 DIAGNOSIS — M5134 Other intervertebral disc degeneration, thoracic region: Secondary | ICD-10-CM | POA: Diagnosis not present

## 2017-05-11 DIAGNOSIS — M9903 Segmental and somatic dysfunction of lumbar region: Secondary | ICD-10-CM | POA: Diagnosis not present

## 2017-05-11 DIAGNOSIS — M9902 Segmental and somatic dysfunction of thoracic region: Secondary | ICD-10-CM | POA: Diagnosis not present

## 2017-05-14 DIAGNOSIS — M546 Pain in thoracic spine: Secondary | ICD-10-CM | POA: Diagnosis not present

## 2017-05-14 DIAGNOSIS — M9903 Segmental and somatic dysfunction of lumbar region: Secondary | ICD-10-CM | POA: Diagnosis not present

## 2017-05-14 DIAGNOSIS — M9902 Segmental and somatic dysfunction of thoracic region: Secondary | ICD-10-CM | POA: Diagnosis not present

## 2017-05-14 DIAGNOSIS — M5134 Other intervertebral disc degeneration, thoracic region: Secondary | ICD-10-CM | POA: Diagnosis not present

## 2017-05-17 DIAGNOSIS — M9903 Segmental and somatic dysfunction of lumbar region: Secondary | ICD-10-CM | POA: Diagnosis not present

## 2017-05-17 DIAGNOSIS — M546 Pain in thoracic spine: Secondary | ICD-10-CM | POA: Diagnosis not present

## 2017-05-17 DIAGNOSIS — M9902 Segmental and somatic dysfunction of thoracic region: Secondary | ICD-10-CM | POA: Diagnosis not present

## 2017-05-17 DIAGNOSIS — M5134 Other intervertebral disc degeneration, thoracic region: Secondary | ICD-10-CM | POA: Diagnosis not present

## 2017-05-25 DIAGNOSIS — M9902 Segmental and somatic dysfunction of thoracic region: Secondary | ICD-10-CM | POA: Diagnosis not present

## 2017-05-25 DIAGNOSIS — M546 Pain in thoracic spine: Secondary | ICD-10-CM | POA: Diagnosis not present

## 2017-05-25 DIAGNOSIS — M9903 Segmental and somatic dysfunction of lumbar region: Secondary | ICD-10-CM | POA: Diagnosis not present

## 2017-05-25 DIAGNOSIS — M5134 Other intervertebral disc degeneration, thoracic region: Secondary | ICD-10-CM | POA: Diagnosis not present

## 2017-06-21 ENCOUNTER — Encounter: Payer: Self-pay | Admitting: Family Medicine

## 2017-06-21 ENCOUNTER — Ambulatory Visit (INDEPENDENT_AMBULATORY_CARE_PROVIDER_SITE_OTHER): Payer: 59 | Admitting: Family Medicine

## 2017-06-21 VITALS — BP 120/60 | HR 90 | Temp 98.4°F | Resp 12 | Ht 63.0 in | Wt 165.2 lb

## 2017-06-21 DIAGNOSIS — Z8639 Personal history of other endocrine, nutritional and metabolic disease: Secondary | ICD-10-CM | POA: Diagnosis not present

## 2017-06-21 DIAGNOSIS — Z1231 Encounter for screening mammogram for malignant neoplasm of breast: Secondary | ICD-10-CM | POA: Diagnosis not present

## 2017-06-21 DIAGNOSIS — E785 Hyperlipidemia, unspecified: Secondary | ICD-10-CM

## 2017-06-21 DIAGNOSIS — Z1211 Encounter for screening for malignant neoplasm of colon: Secondary | ICD-10-CM | POA: Diagnosis not present

## 2017-06-21 DIAGNOSIS — R5383 Other fatigue: Secondary | ICD-10-CM

## 2017-06-21 DIAGNOSIS — I10 Essential (primary) hypertension: Secondary | ICD-10-CM | POA: Diagnosis not present

## 2017-06-21 DIAGNOSIS — E8881 Metabolic syndrome: Secondary | ICD-10-CM | POA: Diagnosis not present

## 2017-06-21 DIAGNOSIS — Z01419 Encounter for gynecological examination (general) (routine) without abnormal findings: Secondary | ICD-10-CM

## 2017-06-21 DIAGNOSIS — L700 Acne vulgaris: Secondary | ICD-10-CM

## 2017-06-21 DIAGNOSIS — Z1239 Encounter for other screening for malignant neoplasm of breast: Secondary | ICD-10-CM

## 2017-06-21 MED ORDER — CLINDAMYCIN PHOS-BENZOYL PEROX 1.2-5 % EX GEL: 1.0000 g | Freq: Two times a day (BID) | CUTANEOUS | 0 refills | Status: DC

## 2017-06-21 MED ORDER — METOPROLOL SUCCINATE ER 25 MG PO TB24: 25.0000 mg | ORAL_TABLET | Freq: Every day | ORAL | 3 refills | Status: DC

## 2017-06-21 NOTE — Patient Instructions (Signed)
Preventive Care 40-64 Years, Female Preventive care refers to lifestyle choices and visits with your health care provider that can promote health and wellness. What does preventive care include?  A yearly physical exam. This is also called an annual well check.  Dental exams once or twice a year.  Routine eye exams. Ask your health care provider how often you should have your eyes checked.  Personal lifestyle choices, including: ? Daily care of your teeth and gums. ? Regular physical activity. ? Eating a healthy diet. ? Avoiding tobacco and drug use. ? Limiting alcohol use. ? Practicing safe sex. ? Taking low-dose aspirin daily starting at age 51. ? Taking vitamin and mineral supplements as recommended by your health care provider. What happens during an annual well check? The services and screenings done by your health care provider during your annual well check will depend on your age, overall health, lifestyle risk factors, and family history of disease. Counseling Your health care provider may ask you questions about your:  Alcohol use.  Tobacco use.  Drug use.  Emotional well-being.  Home and relationship well-being.  Sexual activity.  Eating habits.  Work and work Statistician.  Method of birth control.  Menstrual cycle.  Pregnancy history.  Screening You may have the following tests or measurements:  Height, weight, and BMI.  Blood pressure.  Lipid and cholesterol levels. These may be checked every 5 years, or more frequently if you are over 60 years old.  Skin check.  Lung cancer screening. You may have this screening every year starting at age 51 if you have a 30-pack-year history of smoking and currently smoke or have quit within the past 15 years.  Fecal occult blood test (FOBT) of the stool. You may have this test every year starting at age 51.  Flexible sigmoidoscopy or colonoscopy. You may have a sigmoidoscopy every 5 years or a colonoscopy  every 10 years starting at age 51.  Hepatitis C blood test.  Hepatitis B blood test.  Sexually transmitted disease (STD) testing.  Diabetes screening. This is done by checking your blood sugar (glucose) after you have not eaten for a while (fasting). You may have this done every 1-3 years.  Mammogram. This may be done every 1-2 years. Talk to your health care provider about when you should start having regular mammograms. This may depend on whether you have a family history of breast cancer.  BRCA-related cancer screening. This may be done if you have a family history of breast, ovarian, tubal, or peritoneal cancers.  Pelvic exam and Pap test. This may be done every 3 years starting at age 51. Starting at age 23, this may be done every 5 years if you have a Pap test in combination with an HPV test.  Bone density scan. This is done to screen for osteoporosis. You may have this scan if you are at high risk for osteoporosis.  Discuss your test results, treatment options, and if necessary, the need for more tests with your health care provider. Vaccines Your health care provider may recommend certain vaccines, such as:  Influenza vaccine. This is recommended every year.  Tetanus, diphtheria, and acellular pertussis (Tdap, Td) vaccine. You may need a Td booster every 10 years.  Varicella vaccine. You may need this if you have not been vaccinated.  Zoster vaccine. You may need this after age 51.  Measles, mumps, and rubella (MMR) vaccine. You may need at least one dose of MMR if you were born in  1957 or later. You may also need a second dose.  Pneumococcal 13-valent conjugate (PCV13) vaccine. You may need this if you have certain conditions and were not previously vaccinated.  Pneumococcal polysaccharide (PPSV23) vaccine. You may need one or two doses if you smoke cigarettes or if you have certain conditions.  Meningococcal vaccine. You may need this if you have certain  conditions.  Hepatitis A vaccine. You may need this if you have certain conditions or if you travel or work in places where you may be exposed to hepatitis A.  Hepatitis B vaccine. You may need this if you have certain conditions or if you travel or work in places where you may be exposed to hepatitis B.  Haemophilus influenzae type b (Hib) vaccine. You may need this if you have certain conditions.  Talk to your health care provider about which screenings and vaccines you need and how often you need them. This information is not intended to replace advice given to you by your health care provider. Make sure you discuss any questions you have with your health care provider. Document Released: 09/13/2015 Document Revised: 05/06/2016 Document Reviewed: 06/18/2015 Elsevier Interactive Patient Education  2017 [employer].

## 2017-06-21 NOTE — Progress Notes (Signed)
Name: Sydney [name]   MRN: [id number]    DOB: 25-May-1966   Date:06/21/2017       Progress Note  Subjective  Chief Complaint  Chief Complaint  Patient presents with  . Annual Exam    HPI  Well Woman: never sexually active, no vaginal discharge, no bladder problems, no breast lumps or change in bowel movements  HTN: she has been taking Metoprolol denies dizziness, SOB or chest pain  Dyslipidemia: on diet only, we will recheck labs, she has gained weight since last visit. She has been eating fast food more often  Metabolic Metabolic: she denies polyphagia, polyuria or polyphagia. She joined [employer] in 2017, but not going to meetings lately  Vaginal irritation: she did a spin class and caused some local trauma with some blood on underwear, it was one year ago, and it was only after spin lesson, advised to come in if any vaginal bleeding.   Acne: doing well with topical medication  Patient Active Problem List   Diagnosis Date Noted  . Low back pain with sciatica 02/24/2015  . Dyslipidemia 02/24/2015  . Dermatitis, eczematoid 02/24/2015  . Acid reflux 02/24/2015  . Benign hypertension 02/24/2015  . Iron deficiency 02/24/2015  . Dysmetabolic syndrome 15/17/6160  . Obesity (BMI 30.0-34.9) 02/24/2015  . Allergic rhinitis 02/24/2015  . Hematuria, microscopic 06/04/2010    Past Surgical History:  Procedure Laterality Date  . URETHRAL DILATION  Age 80    Family History  Problem Relation Age of Onset  . Hypertension Mother   . Hyperlipidemia Mother   . Dementia Mother   . Diabetes Father   . CAD Father   . Psoriasis Father   . Heart disease Father   . Cancer Father        Prostate and Multiple Myeloma  . Asthma Sister   . Thyroid disease Sister   . Diabetes Sister     Social History   Social History  . Marital status: Single    Spouse name: N/A  . Number of children: N/A  . Years of education: N/A   Occupational History  . Not on file.   Social  History Main Topics  . Smoking status: Never Smoker  . Smokeless tobacco: Never Used  . Alcohol use No  . Drug use: No  . Sexual activity: Not Currently   Other Topics Concern  . Not on file   Social History Narrative  . No narrative on file     Current Outpatient Prescriptions:  .  Calcium-Phosphorus-Vitamin D (CITRACAL +D3 PO), Take 2 tablets by mouth daily., Disp: , Rfl:  .  Clindamycin-Benzoyl Per, Refr, gel, Apply 1 g topically 2 (two) times daily., Disp: 45 g, Rfl: 0 .  ibuprofen (ADVIL,MOTRIN) 100 MG tablet, Take 100 mg by mouth 2 (two) times daily as needed for fever., Disp: , Rfl:  .  metoprolol succinate (TOPROL-XL) 25 MG 24 hr tablet, Take 1 tablet (25 mg total) by mouth daily., Disp: 90 tablet, Rfl: 3 .  Multiple Vitamins-Minerals (CENTRUM SILVER ULTRA WOMENS PO), Take 1 tablet by mouth daily., Disp: , Rfl:   Allergies  Allergen Reactions  . Amoxicillin   . Sulfa Antibiotics      ROS  Constitutional: Negative for fever or weight change.  Respiratory: Negative for cough and shortness of breath.   Cardiovascular: Negative for chest pain or palpitations.  Gastrointestinal: Negative for abdominal pain, no bowel changes.  Musculoskeletal: Negative for gait problem or joint swelling.  Skin: Negative  for rash.  Neurological: Negative for dizziness or headache.  No other specific complaints in a complete review of systems (except as listed in HPI above).  Objective  Vitals:   06/21/17 1412  BP: 120/60  Pulse: 90  Resp: 12  Temp: 98.4 F (36.9 C)  TempSrc: Oral  SpO2: 99%  Weight: 165 lb 3.2 oz (74.9 kg)  Height: '5\' 3"'$  (1.6 m)    Body mass index is 29.26 kg/m.  Physical Exam  Constitutional: Patient appears well-developed and well-nourished. No distress.  HENT: Head: Normocephalic and atraumatic. Ears: B TMs ok, no erythema or effusion; Nose: Nose normal. Mouth/Throat: Oropharynx is clear and moist. No oropharyngeal exudate.  Eyes: Conjunctivae and EOM  are normal. Pupils are equal, round, and reactive to light. No scleral icterus.  Neck: Normal range of motion. Neck supple. No JVD present. No thyromegaly present.  Cardiovascular: Normal rate, regular rhythm and normal heart sounds.  No murmur heard. No BLE edema. Pulmonary/Chest: Effort normal and breath sounds normal. No respiratory distress. Abdominal: Soft. Bowel sounds are normal, no distension. There is no tenderness. no masses Breast: no lumps or masses, no nipple discharge or rashes FEMALE GENITALIA:  External genitalia normal External urethra normal She has two skin tags on labia minora  RECTAL: not done Musculoskeletal: Normal range of motion, no joint effusions. No gross deformities Neurological: he is alert and oriented to person, place, and time. No cranial nerve deficit. Coordination, balance, strength, speech and gait are normal.  Skin: Skin is warm and dry. No rash noted. No erythema.  Psychiatric: Patient has a normal mood and affect. behavior is normal. Judgment and thought content normal.   PHQ2/9: Depression screen [facility] 2/9 06/21/2017 06/17/2016 02/27/2015  Decreased Interest 0 0 0  Down, Depressed, Hopeless 0 0 0  PHQ - 2 Score 0 0 0     Fall Risk: Fall Risk  06/21/2017 06/17/2016 02/27/2015  Falls in the past year? No No No     Functional Status Survey: Is the patient deaf or have difficulty hearing?: No Does the patient have difficulty seeing, even when wearing glasses/contacts?: No Does the patient have difficulty concentrating, remembering, or making decisions?: No Does the patient have difficulty walking or climbing stairs?: No Does the patient have difficulty dressing or bathing?: No Does the patient have difficulty doing errands alone such as visiting a doctor's office or shopping?: No   Assessment & Plan  1. Well woman exam  Discussed importance of 150 minutes of physical activity weekly, eat two servings of fish weekly, eat one serving of tree  nuts ( cashews, pistachios, pecans, almonds.Marland Kitchen) every other day, eat 6 servings of fruit/vegetables daily and drink plenty of water and avoid sweet beverages.   2. Benign hypertension  - metoprolol succinate (TOPROL-XL) 25 MG 24 hr tablet; Take 1 tablet (25 mg total) by mouth daily.  Dispense: 90 tablet; Refill: 3 - COMPLETE METABOLIC PANEL WITH GFR  3. Dyslipidemia  - Lipid panel  4. Dysmetabolic syndrome  - Hemoglobin A1c - Insulin, random  5. History of iron deficiency  - CBC with Differential/Platelet - Iron, TIBC and Ferritin Panel; Future  6. Acne vulgaris  - Clindamycin-Benzoyl Per, Refr, gel; Apply 1 g topically 2 (two) times daily.  Dispense: 45 g; Refill: 0  7. Breast cancer screening  - MM DIGITAL SCREENING BILATERAL; Future  8. Colon cancer screening  - Cologuard  9. Other fatigue  - VITAMIN D 25 Hydroxy (Vit-D Deficiency, Fractures) - Vitamin B12 - TSH

## 2017-07-06 ENCOUNTER — Ambulatory Visit
Admission: RE | Admit: 2017-07-06 | Discharge: 2017-07-06 | Disposition: A | Discharge status: Home / Self Care | Payer: 59 | Source: Ambulatory Visit | Attending: Family Medicine | Admitting: Family Medicine

## 2017-07-06 DIAGNOSIS — Z1231 Encounter for screening mammogram for malignant neoplasm of breast: Secondary | ICD-10-CM | POA: Diagnosis not present

## 2017-07-06 DIAGNOSIS — Z1239 Encounter for other screening for malignant neoplasm of breast: Secondary | ICD-10-CM

## 2017-07-14 DIAGNOSIS — Z1212 Encounter for screening for malignant neoplasm of rectum: Secondary | ICD-10-CM | POA: Diagnosis not present

## 2017-07-14 DIAGNOSIS — Z1211 Encounter for screening for malignant neoplasm of colon: Secondary | ICD-10-CM | POA: Diagnosis not present

## 2017-07-19 DIAGNOSIS — I10 Essential (primary) hypertension: Secondary | ICD-10-CM | POA: Diagnosis not present

## 2017-07-19 DIAGNOSIS — E785 Hyperlipidemia, unspecified: Secondary | ICD-10-CM | POA: Diagnosis not present

## 2017-07-19 DIAGNOSIS — E8881 Metabolic syndrome: Secondary | ICD-10-CM | POA: Diagnosis not present

## 2017-07-19 DIAGNOSIS — Z8639 Personal history of other endocrine, nutritional and metabolic disease: Secondary | ICD-10-CM | POA: Diagnosis not present

## 2017-07-20 LAB — INSULIN, RANDOM: INSULIN: 4.5 u[IU]/mL (ref 2.0–19.6)

## 2017-07-20 LAB — CBC WITH DIFFERENTIAL/PLATELET
BASOS ABS: 51 {cells}/uL (ref 0–200)
Basophils Relative: 1.5 %
EOS PCT: 4.7 %
Eosinophils Absolute: 160 cells/uL (ref 15–500)
HEMATOCRIT: 34.5 % — AB (ref 35.0–45.0)
Hemoglobin: 11.7 g/dL (ref 11.7–15.5)
LYMPHS ABS: 694 {cells}/uL — AB (ref 850–3900)
MCH: 30.3 pg (ref 27.0–33.0)
MCHC: 33.9 g/dL (ref 32.0–36.0)
MCV: 89.4 fL (ref 80.0–100.0)
MPV: 12.6 fL — ABNORMAL HIGH (ref 7.5–12.5)
Monocytes Relative: 10.5 %
NEUTROS PCT: 62.9 %
Neutro Abs: 2139 cells/uL (ref 1500–7800)
Platelets: 190 10*3/uL (ref 140–400)
RBC: 3.86 10*6/uL (ref 3.80–5.10)
RDW: 12 % (ref 11.0–15.0)
Total Lymphocyte: 20.4 %
WBC: 3.4 10*3/uL — ABNORMAL LOW (ref 3.8–10.8)
WBCMIX: 357 {cells}/uL (ref 200–950)

## 2017-07-20 LAB — COMPLETE METABOLIC PANEL WITH GFR
AG RATIO: 1.7 (calc) (ref 1.0–2.5)
ALBUMIN MSPROF: 4 g/dL (ref 3.6–5.1)
ALT: 20 U/L (ref 6–29)
AST: 22 U/L (ref 10–35)
Alkaline phosphatase (APISO): 71 U/L (ref 33–130)
BILIRUBIN TOTAL: 0.6 mg/dL (ref 0.2–1.2)
BUN: 18 mg/dL (ref 7–25)
CHLORIDE: 104 mmol/L (ref 98–110)
CO2: 29 mmol/L (ref 20–32)
Calcium: 8.8 mg/dL (ref 8.6–10.4)
Creat: 0.74 mg/dL (ref 0.50–1.05)
GFR, EST AFRICAN AMERICAN: 109 mL/min/{1.73_m2} (ref >=60)
GFR, Est Non African American: 94 mL/min/{1.73_m2} (ref >=60)
GLOBULIN: 2.4 g/dL (ref 1.9–3.7)
GLUCOSE: 83 mg/dL (ref 65–99)
Potassium: 3.9 mmol/L (ref 3.5–5.3)
SODIUM: 140 mmol/L (ref 135–146)
TOTAL PROTEIN: 6.4 g/dL (ref 6.1–8.1)

## 2017-07-20 LAB — VITAMIN D 25 HYDROXY (VIT D DEFICIENCY, FRACTURES): VIT D 25 HYDROXY: 43 ng/mL (ref 30–100)

## 2017-07-20 LAB — TSH: TSH: 1.73 mIU/L

## 2017-07-20 LAB — HEMOGLOBIN A1C
HEMOGLOBIN A1C: 5.5 %{Hb} (ref <5.7)
MEAN PLASMA GLUCOSE: 111 (calc)
eAG (mmol/L): 6.2 (calc)

## 2017-07-20 LAB — LIPID PANEL
CHOLESTEROL: 213 mg/dL — AB (ref <200)
HDL: 69 mg/dL (ref >50)
LDL CHOLESTEROL (CALC): 130 mg/dL — AB
Non-HDL Cholesterol (Calc): 144 mg/dL (calc) — ABNORMAL HIGH (ref <130)
TRIGLYCERIDES: 58 mg/dL (ref <150)
Total CHOL/HDL Ratio: 3.1 (calc) (ref <5.0)

## 2017-07-20 LAB — VITAMIN B12: VITAMIN B 12: 1242 pg/mL — AB (ref 200–1100)

## 2017-07-26 ENCOUNTER — Other Ambulatory Visit: Payer: Self-pay | Admitting: Family Medicine

## 2017-07-26 DIAGNOSIS — D72810 Lymphocytopenia: Secondary | ICD-10-CM

## 2017-07-26 DIAGNOSIS — D649 Anemia, unspecified: Secondary | ICD-10-CM

## 2017-07-27 ENCOUNTER — Other Ambulatory Visit: Payer: Self-pay

## 2017-07-27 DIAGNOSIS — D649 Anemia, unspecified: Secondary | ICD-10-CM

## 2017-07-27 DIAGNOSIS — D72810 Lymphocytopenia: Secondary | ICD-10-CM

## 2017-08-11 DIAGNOSIS — H6120 Impacted cerumen, unspecified ear: Secondary | ICD-10-CM | POA: Diagnosis not present

## 2017-08-14 IMAGING — CR DG ABDOMEN 1V
1 series · 1 of 1 positions shown · non-contrast
Comparison: Abdominal pelvic CT scan June 04, 2014

CLINICAL DATA: History of left-sided kidney stones

EXAM:
ABDOMEN - 1 VIEW

[dg abd 1 view]
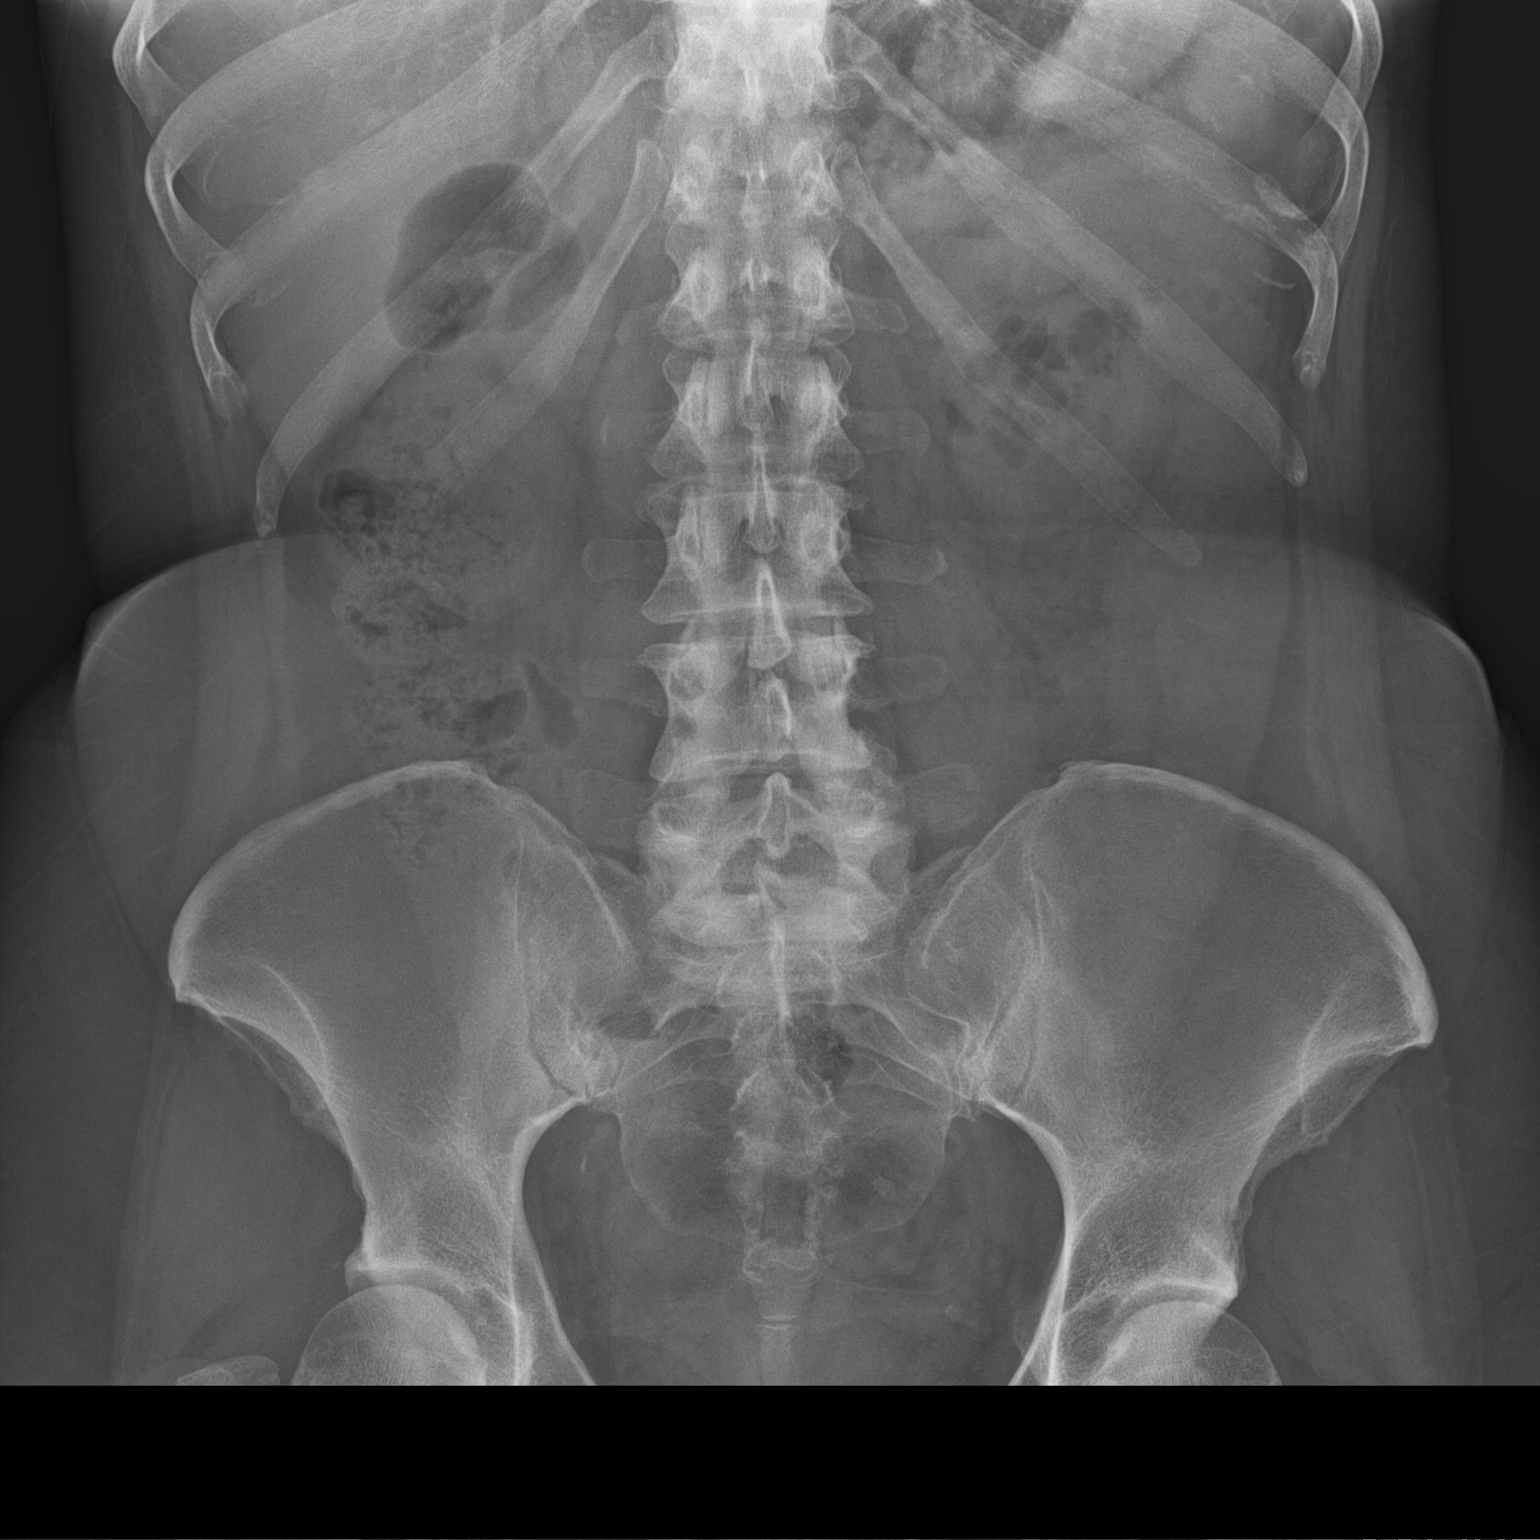

[1 of 1 positions shown; findings below may reference images not displayed]

FINDINGS: There is an approximately will 1 x 2 mm calcification that projects
over the mid upper pole of the left kidney. No stones are evident
overlying the right kidney. There are stable phleboliths in the
pelvis bilaterally. The bowel gas pattern is normal. The bony
structures exhibit no acute abnormalities.
IMPRESSION: Tiny mid upper pole left-sided kidney stone. No urinary tract stones
are observed elsewhere.

## 2017-08-30 DIAGNOSIS — D72810 Lymphocytopenia: Secondary | ICD-10-CM | POA: Diagnosis not present

## 2017-08-30 DIAGNOSIS — D649 Anemia, unspecified: Secondary | ICD-10-CM | POA: Diagnosis not present

## 2017-08-31 LAB — CBC WITH DIFFERENTIAL/PLATELET
BASOS PCT: 1.1 %
Basophils Absolute: 48 cells/uL (ref 0–200)
EOS ABS: 273 {cells}/uL (ref 15–500)
Eosinophils Relative: 6.2 %
HCT: 36.4 % (ref 35.0–45.0)
Hemoglobin: 12.2 g/dL (ref 11.7–15.5)
Lymphs Abs: 906 cells/uL (ref 850–3900)
MCH: 30.3 pg (ref 27.0–33.0)
MCHC: 33.5 g/dL (ref 32.0–36.0)
MCV: 90.5 fL (ref 80.0–100.0)
MONOS PCT: 10.3 %
MPV: 12 fL (ref 7.5–12.5)
NEUTROS PCT: 61.8 %
Neutro Abs: 2719 cells/uL (ref 1500–7800)
PLATELETS: 212 10*3/uL (ref 140–400)
RBC: 4.02 10*6/uL (ref 3.80–5.10)
RDW: 11.9 % (ref 11.0–15.0)
TOTAL LYMPHOCYTE: 20.6 %
WBC: 4.4 10*3/uL (ref 3.8–10.8)
WBCMIX: 453 {cells}/uL (ref 200–950)

## 2017-08-31 LAB — IRON,TIBC AND FERRITIN PANEL
%SAT: 25 % (calc) (ref 11–50)
FERRITIN: 21 ng/mL (ref 10–232)
IRON: 78 ug/dL (ref 45–160)
TIBC: 309 ug/dL (ref 250–450)

## 2017-12-01 DIAGNOSIS — H5213 Myopia, bilateral: Secondary | ICD-10-CM | POA: Diagnosis not present

## 2017-12-29 DIAGNOSIS — D2271 Melanocytic nevi of right lower limb, including hip: Secondary | ICD-10-CM | POA: Diagnosis not present

## 2017-12-29 DIAGNOSIS — D225 Melanocytic nevi of trunk: Secondary | ICD-10-CM | POA: Diagnosis not present

## 2017-12-29 DIAGNOSIS — D2262 Melanocytic nevi of left upper limb, including shoulder: Secondary | ICD-10-CM | POA: Diagnosis not present

## 2018-02-07 ENCOUNTER — Ambulatory Visit: Payer: Self-pay | Admitting: *Deleted

## 2018-02-07 NOTE — Telephone Encounter (Signed)
I returned her call.   She started having vomiting and diarrhea Saturday night after eating chicken spaghetti and was wondering if that is what caused her to be sick.    I let her know I wasn't able to tell her if it was a virus or from the spaghetti. She had not tried anything OTC except one dose of Imodium.    I Went over the home care advise with her.   She verbalized understanding and was agreeable to the home care advice.    Reason for Disposition . SEVERE diarrhea (e.g., 7 or more times / day more than normal)  Answer Assessment - Initial Assessment Questions 1. DIARRHEA SEVERITY: "How bad is the diarrhea?" "How many extra stools have you had in the past 24 hours than normal?"    - MILD: Few loose or mushy BMs; increase of 1-3 stools over normal daily number of stools; mild increase in ostomy output.   - MODERATE: Increase of 4-6 stools daily over normal; moderate increase in ostomy output.   - SEVERE (or Worst Possible): Increase of 7 or more stools daily over normal; moderate increase in ostomy output; incontinence.     Vomited several times Saturday night.   I had diarrhea too. 2. ONSET: "When did the diarrhea begin?"      Saturday       Yesterday still had diarrhea and Gater Aid I was drinking. 3. BM CONSISTENCY: "How loose or watery is the diarrhea?"      I had diarrhea twice this morning.    Not taken anything over the counter. 4. VOMITING: "Are you also vomiting?" If so, ask: "How many times in the past 24 hours?"      No vomiting.  Feeling nauseas.   Drink Gator Aid.    Over night no diarrhea for the first time.     5. ABDOMINAL PAIN: "Are you having any abdominal pain?" If yes: "What does it feel like?" (e.g., crampy, dull, intermittent, constant)      I did before I vomited Saturday.    6. ABDOMINAL PAIN SEVERITY: If present, ask: "How bad is the pain?"  (e.g., Scale 1-10; mild, moderate, or severe)    - MILD (1-3): doesn't interfere with normal activities, abdomen soft and  not tender to touch     - MODERATE (4-7): interferes with normal activities or awakens from sleep, tender to touch     - SEVERE (8-10): excruciating pain, doubled over, unable to do any normal activities       None 7. ORAL INTAKE: If vomiting, "Have you been able to drink liquids?" "How much fluids have you had in the past 24 hours?"     Drinking Gator Aid 8. HYDRATION: "Any signs of dehydration?" (e.g., dry mouth [not just dry lips], too weak to stand, dizziness, new weight loss) "When did you last urinate?"     Drinking fluids 9. EXPOSURE: "Have you traveled to a foreign country recently?" "Have you been exposed to anyone with diarrhea?" "Could you have eaten any food that was spoiled?"     No.   No travel.   10. OTHER SYMPTOMS: "Do you have any other symptoms?" (e.g., fever, blood in stool)       No 11. PREGNANCY: "Is there any chance you are pregnant?" "When was your last menstrual period?"       No  Protocols used: DIARRHEA-A-AH

## 2018-06-24 ENCOUNTER — Encounter: Payer: 59 | Admitting: Family Medicine

## 2018-08-12 ENCOUNTER — Other Ambulatory Visit: Payer: Self-pay | Admitting: Family Medicine

## 2018-08-12 DIAGNOSIS — I10 Essential (primary) hypertension: Secondary | ICD-10-CM

## 2018-09-29 ENCOUNTER — Encounter: Payer: Self-pay | Admitting: Family Medicine

## 2018-09-29 ENCOUNTER — Other Ambulatory Visit (HOSPITAL_COMMUNITY)
Admission: RE | Admit: 2018-09-29 | Discharge: 2018-09-29 | Disposition: A | Discharge status: Home / Self Care | Payer: 59 | Source: Ambulatory Visit | Attending: Family Medicine | Admitting: Family Medicine

## 2018-09-29 ENCOUNTER — Ambulatory Visit: Payer: 59 | Admitting: Family Medicine

## 2018-09-29 ENCOUNTER — Telehealth: Payer: Self-pay | Admitting: Family Medicine

## 2018-09-29 VITALS — BP 136/84 | HR 77 | Temp 97.6°F | Resp 16 | Ht 63.0 in | Wt 167.6 lb

## 2018-09-29 DIAGNOSIS — Z114 Encounter for screening for human immunodeficiency virus [HIV]: Secondary | ICD-10-CM | POA: Diagnosis not present

## 2018-09-29 DIAGNOSIS — Z01419 Encounter for gynecological examination (general) (routine) without abnormal findings: Secondary | ICD-10-CM | POA: Diagnosis not present

## 2018-09-29 DIAGNOSIS — Z8639 Personal history of other endocrine, nutritional and metabolic disease: Secondary | ICD-10-CM | POA: Diagnosis not present

## 2018-09-29 DIAGNOSIS — E785 Hyperlipidemia, unspecified: Secondary | ICD-10-CM

## 2018-09-29 DIAGNOSIS — I10 Essential (primary) hypertension: Secondary | ICD-10-CM | POA: Diagnosis not present

## 2018-09-29 DIAGNOSIS — E8881 Metabolic syndrome: Secondary | ICD-10-CM | POA: Diagnosis not present

## 2018-09-29 DIAGNOSIS — Z1159 Encounter for screening for other viral diseases: Secondary | ICD-10-CM

## 2018-09-29 MED ORDER — HYDROCHLOROTHIAZIDE 12.5 MG PO TABS: 12.5000 mg | ORAL_TABLET | Freq: Every day | ORAL | 3 refills | Status: DC

## 2018-09-29 MED ORDER — METOPROLOL SUCCINATE ER 25 MG PO TB24: 125.0000 mg | ORAL_TABLET | Freq: Every day | ORAL | 3 refills | Status: DC

## 2018-09-29 NOTE — Patient Instructions (Signed)
Preventive Care 40-64 Years, Female Preventive care refers to lifestyle choices and visits with your health care provider that can promote health and wellness. What does preventive care include?   A yearly physical exam. This is also called an annual well check.  Dental exams once or twice a year.  Routine eye exams. Ask your health care provider how often you should have your eyes checked.  Personal lifestyle choices, including: ? Daily care of your teeth and gums. ? Regular physical activity. ? Eating a healthy diet. ? Avoiding tobacco and drug use. ? Limiting alcohol use. ? Practicing safe sex. ? Taking low-dose aspirin daily starting at age 50. ? Taking vitamin and mineral supplements as recommended by your health care provider. What happens during an annual well check? The services and screenings done by your health care provider during your annual well check will depend on your age, overall health, lifestyle risk factors, and family history of disease. Counseling Your health care provider may ask you questions about your:  Alcohol use.  Tobacco use.  Drug use.  Emotional well-being.  Home and relationship well-being.  Sexual activity.  Eating habits.  Work and work environment.  Method of birth control.  Menstrual cycle.  Pregnancy history. Screening You may have the following tests or measurements:  Height, weight, and BMI.  Blood pressure.  Lipid and cholesterol levels. These may be checked every 5 years, or more frequently if you are over 50 years old.  Skin check.  Lung cancer screening. You may have this screening every year starting at age 55 if you have a 30-pack-year history of smoking and currently smoke or have quit within the past 15 years.  Colorectal cancer screening. All adults should have this screening starting at age 50 and continuing until age 75. Your health care provider may recommend screening at age 45. You will have tests every  1-10 years, depending on your results and the type of screening test. People at increased risk should start screening at an earlier age. Screening tests may include: ? Guaiac-based fecal occult blood testing. ? Fecal immunochemical test (FIT). ? Stool DNA test. ? Virtual colonoscopy. ? Sigmoidoscopy. During this test, a flexible tube with a tiny camera (sigmoidoscope) is used to examine your rectum and lower colon. The sigmoidoscope is inserted through your anus into your rectum and lower colon. ? Colonoscopy. During this test, a long, thin, flexible tube with a tiny camera (colonoscope) is used to examine your entire colon and rectum.  Hepatitis C blood test.  Hepatitis B blood test.  Sexually transmitted disease (STD) testing.  Diabetes screening. This is done by checking your blood sugar (glucose) after you have not eaten for a while (fasting). You may have this done every 1-3 years.  Mammogram. This may be done every 1-2 years. Talk to your health care provider about when you should start having regular mammograms. This may depend on whether you have a family history of breast cancer.  BRCA-related cancer screening. This may be done if you have a family history of breast, ovarian, tubal, or peritoneal cancers.  Pelvic exam and Pap test. This may be done every 3 years starting at age 21. Starting at age 30, this may be done every 5 years if you have a Pap test in combination with an HPV test.  Bone density scan. This is done to screen for osteoporosis. You may have this scan if you are at high risk for osteoporosis. Discuss your test results, treatment options,   and if necessary, the need for more tests with your health care provider. Vaccines Your health care provider may recommend certain vaccines, such as:  Influenza vaccine. This is recommended every year.  Tetanus, diphtheria, and acellular pertussis (Tdap, Td) vaccine. You may need a Td booster every 10 years.  Varicella  vaccine. You may need this if you have not been vaccinated.  Zoster vaccine. You may need this after age 38.  Measles, mumps, and rubella (MMR) vaccine. You may need at least one dose of MMR if you were born in 1957 or later. You may also need a second dose.  Pneumococcal 13-valent conjugate (PCV13) vaccine. You may need this if you have certain conditions and were not previously vaccinated.  Pneumococcal polysaccharide (PPSV23) vaccine. You may need one or two doses if you smoke cigarettes or if you have certain conditions.  Meningococcal vaccine. You may need this if you have certain conditions.  Hepatitis A vaccine. You may need this if you have certain conditions or if you travel or work in places where you may be exposed to hepatitis A.  Hepatitis B vaccine. You may need this if you have certain conditions or if you travel or work in places where you may be exposed to hepatitis B.  Haemophilus influenzae type b (Hib) vaccine. You may need this if you have certain conditions. Talk to your health care provider about which screenings and vaccines you need and how often you need them. This information is not intended to replace advice given to you by your health care provider. Make sure you discuss any questions you have with your health care provider. Document Released: 09/13/2015 Document Revised: 10/07/2017 Document Reviewed: 06/18/2015 Elsevier Interactive Patient Education  2019 [employer].

## 2018-09-29 NOTE — Progress Notes (Signed)
Name: Sydney [name]   MRN: [id number]    DOB: 03-29-1966   Date:09/29/2018       Progress Note  Subjective  Chief Complaint  Chief Complaint  Patient presents with  . Annual Exam    HPI   Patient presents for annual CPE and follow up  HTN: she has been taking Metoprolol denies dizziness, SOB or chest pain, she does feel tired and asked if it could be a side effect of medication. We will try going down to 12.5 mg daily and add hctz , she will let me know how she feels with the change  Dyslipidemia: on diet only, no chest pain or palpitation   Metabolic Metabolic: she denies polyphagia, polyuria or polyphagia. She has not been eating healthy lately, eating more fast food , also not exercising regularly, explained benefits of life style modification to prevent diabetes   Diet: not cooking at home lately, eating more often  Exercise: she has not been consistent   USPSTF grade A and B recommendations   Depression:  Depression screen [facility] 2/9 09/29/2018 06/21/2017 06/17/2016 02/27/2015  Decreased Interest 0 0 0 0  Down, Depressed, Hopeless 0 0 0 0  PHQ - 2 Score 0 0 0 0  Altered sleeping 0 - - -  Tired, decreased energy 1 - - -  Change in appetite 0 - - -  Feeling bad or failure about yourself  0 - - -  Trouble concentrating 0 - - -  Moving slowly or fidgety/restless 0 - - -  Suicidal thoughts 0 - - -  PHQ-9 Score 1 - - -  Difficult doing work/chores Not difficult at all - - -   Hypertension: BP Readings from Last 3 Encounters:  09/29/18 136/84  06/21/17 120/60  06/17/16 134/78   Obesity: Wt Readings from Last 3 Encounters:  09/29/18 167 lb 9.6 oz (76 kg)  06/21/17 165 lb 3.2 oz (74.9 kg)  06/17/16 152 lb 11.2 oz (69.3 kg)   BMI Readings from Last 3 Encounters:  09/29/18 29.69 kg/m  06/21/17 29.26 kg/m  06/17/16 27.05 kg/m    Hep C Screening: today  STD testing and prevention (HIV/chl/gon/syphilis): she agrees on HIV  Intimate partner violence:negative  screen  Sexual History/Pain during Intercourse: not sexually active  Menstrual History/LMP/Abnormal Bleeding: she reached menopause , no cycles in 2019 , discussed pos-menopausal bleeding and need to come in  Incontinence Symptoms: no   Advanced Care Planning: A voluntary discussion about advance care planning including the explanation and discussion of advance directives.  Discussed health care proxy and Living will, and the patient was able to identify a health care proxy as sister .  Patient does not have a living will at present time.   Breast cancer:  HM Mammogram  Date Value Ref Range Status  08/28/2013 Normal  Final    BRCA gene screening:  Cervical cancer screening: today   Osteoporosis Screening: discussed high calcium diet and continue Vitamin D supplementation   Lipids:  Lab Results  Component Value Date   CHOL 213 (H) 07/19/2017   CHOL 240 (H) 07/13/2016   CHOL 198 03/28/2015   Lab Results  Component Value Date   HDL 69 07/19/2017   HDL 71 07/13/2016   HDL 49 03/28/2015   Lab Results  Component Value Date   LDLCALC 130 (H) 07/19/2017   LDLCALC 156 (H) 07/13/2016   LDLCALC 130 (H) 03/28/2015   Lab Results  Component Value Date   TRIG 58 07/19/2017  TRIG 64 07/13/2016   TRIG 93 03/28/2015   Lab Results  Component Value Date   CHOLHDL 3.1 07/19/2017   CHOLHDL 3.4 07/13/2016   CHOLHDL 4.0 03/28/2015   No results found for: LDLDIRECT  Glucose:  Glucose  Date Value Ref Range Status  03/28/2015 92 65 - 99 mg/dL Final   Glucose, Bld  Date Value Ref Range Status  07/19/2017 83 65 - 99 mg/dL Final    Comment:    .            Fasting reference interval .   07/13/2016 81 65 - 99 mg/dL Final    Skin cancer: discussed atypical lesions  Colorectal cancer: she states did cologuard in 2018 but we did not get result, we will send another order if we cannot find it    Lung cancer:   Low Dose CT Chest recommended if Age 48-80 years, 30 pack-year currently  smoking OR have quit w/in 15years. Patient does not qualify.   ECG: today   Patient Active Problem List   Diagnosis Date Noted  . Low back pain with sciatica 02/24/2015  . Dyslipidemia 02/24/2015  . Dermatitis, eczematoid 02/24/2015  . Acid reflux 02/24/2015  . Benign hypertension 02/24/2015  . Iron deficiency 02/24/2015  . Dysmetabolic syndrome 69/48/5462  . Obesity (BMI 30.0-34.9) 02/24/2015  . Allergic rhinitis 02/24/2015  . Hematuria, microscopic 06/04/2010    Past Surgical History:  Procedure Laterality Date  . URETHRAL DILATION  Age 20    Family History  Problem Relation Age of Onset  . Hypertension Mother   . Hyperlipidemia Mother   . Dementia Mother   . Diabetes Father   . CAD Father   . Psoriasis Father   . Heart disease Father   . Cancer Father        Prostate and Multiple Myeloma  . Asthma Sister   . Thyroid disease Sister   . Diabetes Sister   . Breast cancer Neg Hx     Social History   Socioeconomic History  . Marital status: Single    Spouse name: Not on file  . Number of children: 0  . Years of education: Not on file  . Highest education level: Bachelor's degree (e.g., BA, AB, BS)  Occupational History  . Not on file  Social Needs  . Financial resource strain: Not hard at all  . Food insecurity:    Worry: Never true    Inability: Never true  . Transportation needs:    Medical: No    Non-medical: No  Tobacco Use  . Smoking status: Never Smoker  . Smokeless tobacco: Never Used  Substance and Sexual Activity  . Alcohol use: No    Alcohol/week: 0.0 standard drinks  . Drug use: No  . Sexual activity: Not Currently  Lifestyle  . Physical activity:    Days per week: 1 day    Minutes per session: 70 min  . Stress: Not at all  Relationships  . Social connections:    Talks on phone: More than three times a week    Gets together: Twice a week    Attends religious service: More than 4 times per year    Active member of club or organization:  No    Attends meetings of clubs or organizations: Never    Relationship status: Never married  . Intimate partner violence:    Fear of current or ex partner: No    Emotionally abused: No    Physically abused: No  Forced sexual activity: No  Other Topics Concern  . Not on file  Social History Narrative  . Not on file     Current Outpatient Medications:  .  Calcium-Phosphorus-Vitamin D (CITRACAL +D3 PO), Take 2 tablets by mouth daily., Disp: , Rfl:  .  Clindamycin-Benzoyl Per, Refr, gel, Apply 1 g topically 2 (two) times daily., Disp: 45 g, Rfl: 0 .  ibuprofen (ADVIL,MOTRIN) 100 MG tablet, Take 100 mg by mouth 2 (two) times daily as needed for fever., Disp: , Rfl:  .  metoprolol succinate (TOPROL-XL) 25 MG 24 hr tablet, TAKE 1 TABLET (25 MG TOTAL) BY MOUTH DAILY., Disp: 90 tablet, Rfl: 0 .  Multiple Vitamins-Calcium (ONE-A-DAY WOMENS PO), Take 2 each by mouth daily. Gummy, Disp: , Rfl:   Allergies  Allergen Reactions  . Amoxicillin   . Sulfa Antibiotics      ROS  Constitutional: Negative for fever or weight change.  Respiratory: positive  for cough after an URI but no  shortness of breath.   Cardiovascular: Negative for chest pain or palpitations.  Gastrointestinal: Negative for abdominal pain, no bowel changes.  Musculoskeletal: Negative for gait problem or joint swelling.  Skin: Negative for rash.  Neurological: Negative for dizziness or headache.  No other specific complaints in a complete review of systems (except as listed in HPI above).  Objective  Vitals:   09/29/18 0943  BP: 136/84  Pulse: 77  Resp: 16  Temp: 97.6 F (36.4 C)  TempSrc: Oral  SpO2: 98%  Weight: 167 lb 9.6 oz (76 kg)  Height: '5\' 3"'$  (1.6 m)    Body mass index is 29.69 kg/m.  Physical Exam  Constitutional: Patient appears well-developed and well-nourished. No distress.  HENT: Head: Normocephalic and atraumatic. Ears: B TMs ok, no erythema or effusion; Nose: Nose normal. Mouth/Throat:  Oropharynx is clear and moist. No oropharyngeal exudate.  Eyes: Conjunctivae and EOM are normal. Pupils are equal, round, and reactive to light. No scleral icterus.  Neck: Normal range of motion. Neck supple. No JVD present. No thyromegaly present.  Cardiovascular: Normal rate, regular rhythm and normal heart sounds.  No murmur heard. No BLE edema. Pulmonary/Chest: Effort normal and breath sounds normal. No respiratory distress. Abdominal: Soft. Bowel sounds are normal, no distension. There is no tenderness. no masses Breast: no lumps or masses, no nipple discharge or rashes FEMALE GENITALIA:  External genitalia normal External urethra normal Vaginal vault normal without discharge or lesions Cervix normal without discharge or lesions Bimanual exam normal without masses RECTAL: not done  Musculoskeletal: Normal range of motion, no joint effusions. No gross deformities Neurological: he is alert and oriented to person, place, and time. No cranial nerve deficit. Coordination, balance, strength, speech and gait are normal.  Skin: Skin is warm and dry. No rash noted. No erythema.  Psychiatric: Patient has a normal mood and affect. behavior is normal. Judgment and thought content normal.  PHQ2/9: Depression screen [facility] 2/9 09/29/2018 06/21/2017 06/17/2016 02/27/2015  Decreased Interest 0 0 0 0  Down, Depressed, Hopeless 0 0 0 0  PHQ - 2 Score 0 0 0 0  Altered sleeping 0 - - -  Tired, decreased energy 1 - - -  Change in appetite 0 - - -  Feeling bad or failure about yourself  0 - - -  Trouble concentrating 0 - - -  Moving slowly or fidgety/restless 0 - - -  Suicidal thoughts 0 - - -  PHQ-9 Score 1 - - -  Difficult doing  work/chores Not difficult at all - - -    Fall Risk: Fall Risk  09/29/2018 06/21/2017 06/17/2016 02/27/2015  Falls in the past year? 0 No No No    Assessment & Plan  1. Benign hypertension  - COMPLETE METABOLIC PANEL WITH GFR - EKG 12-Lead - metoprolol succinate  (TOPROL-XL) 25 MG 24 hr tablet; Take 5 tablets (125 mg total) by mouth daily.  Dispense: 45 tablet; Refill: 3 - hydrochlorothiazide (HYDRODIURIL) 12.5 MG tablet; Take 1 tablet (12.5 mg total) by mouth daily.  Dispense: 90 tablet; Refill: 3  2. Need for hepatitis C screening test  - Hepatitis C Antibody  3. Encounter for screening for HIV  - HIV Antibody (routine testing w rflx)  4. Dyslipidemia  - Lipid panel  5. Dysmetabolic syndrome  - Hemoglobin A1c  6. History of iron deficiency  - CBC with Differential/Platelet - Iron, TIBC and Ferritin Panel  7. Well woman exam   -USPSTF grade A and B recommendations reviewed with patient; age-appropriate recommendations, preventive care, screening tests, etc discussed and encouraged; healthy living encouraged; see AVS for patient education given to patient -Discussed importance of 150 minutes of physical activity weekly, eat two servings of fish weekly, eat one serving of tree nuts ( cashews, pistachios, pecans, almonds.Marland Kitchen) every other day, eat 6 servings of fruit/vegetables daily and drink plenty of water and avoid sweet beverages.

## 2018-09-29 NOTE — Telephone Encounter (Signed)
Yes, we changed

## 2018-09-29 NOTE — Telephone Encounter (Unsigned)
Copied from [place] [id number]. Topic: Quick Communication - See Telephone Encounter >> Sep 29, 2018 10:28 AM Judyann Munson wrote: CRM for notification. See Telephone encounter for: 09/29/18.  Pharmacy is calling in regards to the  medication metoprolol succinate (TOPROL-XL) 25 MG 24 hr tablet. The patient has been taking one tablet daily  and now the RX statesTake 5 tablets (125 mg total) by mouth daily. They are needing clarification on the dosage instruction.

## 2018-09-30 LAB — CBC WITH DIFFERENTIAL/PLATELET
ABSOLUTE MONOCYTES: 508 {cells}/uL (ref 200–950)
BASOS PCT: 1.1 %
Basophils Absolute: 52 cells/uL (ref 0–200)
Eosinophils Absolute: 254 cells/uL (ref 15–500)
Eosinophils Relative: 5.4 %
HEMATOCRIT: 36.3 % (ref 35.0–45.0)
Hemoglobin: 12.1 g/dL (ref 11.7–15.5)
LYMPHS ABS: 1029 {cells}/uL (ref 850–3900)
MCH: 30 pg (ref 27.0–33.0)
MCHC: 33.3 g/dL (ref 32.0–36.0)
MCV: 89.9 fL (ref 80.0–100.0)
MPV: 12 fL (ref 7.5–12.5)
Monocytes Relative: 10.8 %
Neutro Abs: 2858 cells/uL (ref 1500–7800)
Neutrophils Relative %: 60.8 %
Platelets: 222 10*3/uL (ref 140–400)
RBC: 4.04 10*6/uL (ref 3.80–5.10)
RDW: 12.9 % (ref 11.0–15.0)
Total Lymphocyte: 21.9 %
WBC: 4.7 10*3/uL (ref 3.8–10.8)

## 2018-09-30 LAB — HEPATITIS C ANTIBODY
Hepatitis C Ab: NONREACTIVE
SIGNAL TO CUT-OFF: 0.05 (ref <1.00)

## 2018-09-30 LAB — IRON,TIBC AND FERRITIN PANEL
%SAT: 21 % (ref 16–45)
Ferritin: 18 ng/mL (ref 16–232)
Iron: 66 ug/dL (ref 45–160)
TIBC: 318 ug/dL (ref 250–450)

## 2018-09-30 LAB — COMPLETE METABOLIC PANEL WITH GFR
AG RATIO: 1.5 (calc) (ref 1.0–2.5)
ALT: 21 U/L (ref 6–29)
AST: 25 U/L (ref 10–35)
Albumin: 4 g/dL (ref 3.6–5.1)
Alkaline phosphatase (APISO): 66 U/L (ref 33–130)
BUN: 18 mg/dL (ref 7–25)
CALCIUM: 9.3 mg/dL (ref 8.6–10.4)
CHLORIDE: 105 mmol/L (ref 98–110)
CO2: 29 mmol/L (ref 20–32)
Creat: 0.7 mg/dL (ref 0.50–1.05)
GFR, EST AFRICAN AMERICAN: 115 mL/min/{1.73_m2} (ref >=60)
GFR, EST NON AFRICAN AMERICAN: 100 mL/min/{1.73_m2} (ref >=60)
Globulin: 2.7 g/dL (calc) (ref 1.9–3.7)
Glucose, Bld: 81 mg/dL (ref 65–99)
Potassium: 4 mmol/L (ref 3.5–5.3)
Sodium: 142 mmol/L (ref 135–146)
TOTAL PROTEIN: 6.7 g/dL (ref 6.1–8.1)
Total Bilirubin: 0.5 mg/dL (ref 0.2–1.2)

## 2018-09-30 LAB — HEMOGLOBIN A1C
HEMOGLOBIN A1C: 5.7 %{Hb} — AB (ref <5.7)
Mean Plasma Glucose: 117 (calc)
eAG (mmol/L): 6.5 (calc)

## 2018-09-30 LAB — LIPID PANEL
CHOL/HDL RATIO: 4.1 (calc) (ref <5.0)
Cholesterol: 222 mg/dL — ABNORMAL HIGH (ref <200)
HDL: 54 mg/dL (ref >50)
LDL CHOLESTEROL (CALC): 145 mg/dL — AB
Non-HDL Cholesterol (Calc): 168 mg/dL (calc) — ABNORMAL HIGH (ref <130)
TRIGLYCERIDES: 114 mg/dL (ref <150)

## 2018-09-30 LAB — HIV ANTIBODY (ROUTINE TESTING W REFLEX): HIV 1&2 Ab, 4th Generation: NONREACTIVE

## 2018-09-30 MED ORDER — METOPROLOL SUCCINATE ER 25 MG PO TB24: 12.5000 mg | ORAL_TABLET | Freq: Every day | ORAL | 3 refills | Status: DC

## 2018-09-30 NOTE — Telephone Encounter (Signed)
[facility] pharmacy is questioning the quantity and also want you to take a look at the dose again.

## 2018-10-03 LAB — CYTOLOGY - PAP: DIAGNOSIS: NEGATIVE

## 2019-03-10 ENCOUNTER — Telehealth: Payer: Self-pay | Admitting: Family Medicine

## 2019-03-10 ENCOUNTER — Telehealth: Payer: Self-pay

## 2019-03-10 DIAGNOSIS — Z1211 Encounter for screening for malignant neoplasm of colon: Secondary | ICD-10-CM

## 2019-03-10 NOTE — Telephone Encounter (Signed)
Patient is return calling from a nurse regarding her cologuard. Thank you

## 2019-03-10 NOTE — Telephone Encounter (Signed)
Patient is calling because she is calling to see if her results for her cologuard where ever located?  The patient states if the weren't She is interested in getting another test done.  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 And would like to know can the Cologard be mailed or can she pick up and be brought to her car? Please advise. CB- [phone]  Thank you

## 2019-03-10 NOTE — Telephone Encounter (Signed)
Last Cologuard was ordered in 06/21/2017. She will need another order.

## 2019-03-10 NOTE — Telephone Encounter (Signed)
Copied from [facility] [id number]. Topic: General - Inquiry >> Mar 09, 2019  4:47 PM Rainey Pines A wrote: Patient would like a callback from a nurse in regards to cologuard and patient wants to know if the cologuard would be mailed or she can pick up in office.   I tried to call patient in regards to Cologuard. VM full unable to leave message. Cologuard will be mailed to her by the company.

## 2019-04-07 DIAGNOSIS — H5213 Myopia, bilateral: Secondary | ICD-10-CM | POA: Diagnosis not present

## 2019-07-26 ENCOUNTER — Other Ambulatory Visit: Payer: Self-pay

## 2019-07-26 DIAGNOSIS — Z20822 Contact with and (suspected) exposure to covid-19: Secondary | ICD-10-CM

## 2019-07-27 LAB — NOVEL CORONAVIRUS, NAA: SARS-CoV-2, NAA: NOT DETECTED

## 2019-08-22 DIAGNOSIS — H43811 Vitreous degeneration, right eye: Secondary | ICD-10-CM

## 2019-08-22 HISTORY — DX: Vitreous degeneration, right eye: H43.811

## 2019-08-23 DIAGNOSIS — H43811 Vitreous degeneration, right eye: Secondary | ICD-10-CM | POA: Diagnosis not present

## 2019-09-08 DIAGNOSIS — H43811 Vitreous degeneration, right eye: Secondary | ICD-10-CM | POA: Diagnosis not present

## 2019-10-02 ENCOUNTER — Encounter: Payer: 59 | Admitting: Family Medicine

## 2019-10-10 ENCOUNTER — Other Ambulatory Visit: Payer: Self-pay | Admitting: Family Medicine

## 2019-10-10 DIAGNOSIS — I10 Essential (primary) hypertension: Secondary | ICD-10-CM

## 2019-10-10 NOTE — Telephone Encounter (Signed)
Requested medication (s) are due for refill today: yes  Requested medication (s) are on the active medication list: yes  Last refill: 07/18/2019  Future visit scheduled:no  Notes to clinic:  no valid encounter within last 6 months   Requested Prescriptions  Pending Prescriptions Disp Refills   hydrochlorothiazide (MICROZIDE) 12.5 MG capsule [Pharmacy Med Name: HYDROCHLOROTHIAZIDE 12.5 MG 12.5 Capsule] 90 capsule 3    Sig: TAKE 1 CAPSULE BY MOUTH DAILY.      Cardiovascular: Diuretics - Thiazide Failed - 10/10/2019  7:27 AM      Failed - Ca in normal range and within 360 days    Calcium  Date Value Ref Range Status  09/29/2018 9.3 8.6 - 10.4 mg/dL Final          Failed - Cr in normal range and within 360 days    Creat  Date Value Ref Range Status  09/29/2018 0.70 0.50 - 1.05 mg/dL Final    Comment:    For patients >33 years of age, the reference limit for Creatinine is approximately 13% higher for people identified as African-American. .           Failed - K in normal range and within 360 days    Potassium  Date Value Ref Range Status  09/29/2018 4.0 3.5 - 5.3 mmol/L Final          Failed - Na in normal range and within 360 days    Sodium  Date Value Ref Range Status  09/29/2018 142 135 - 146 mmol/L Final  03/28/2015 140 134 - 144 mmol/L Final          Failed - Valid encounter within last 6 months    Recent Outpatient Visits           1 year ago Benign hypertension   [facility] Steele Sizer, MD   2 years ago Well woman exam   [facility] Steele Sizer, MD   3 years ago Well woman exam   [facility] Steele Sizer, MD   4 years ago Well woman exam   [facility] [place], Drue Stager, MD              Passed - Last BP in normal range    BP Readings from Last 1 Encounters:  09/29/18 136/84            metoprolol succinate (TOPROL-XL) 25 MG 24 hr tablet [Pharmacy Med  Name: METOPROLOL SUCCINATE ER 25 25 Tablet] 45 tablet 3    Sig: TAKE 1/2 TABLET (12.5 MG) BY MOUTH DAILY.      Cardiovascular:  Beta Blockers Failed - 10/10/2019  7:27 AM      Failed - Valid encounter within last 6 months    Recent Outpatient Visits           1 year ago Benign hypertension   [facility] Steele Sizer, MD   2 years ago Well woman exam   [facility] Steele Sizer, MD   3 years ago Well woman exam   [facility] Steele Sizer, MD   4 years ago Well woman exam   [facility] [place], Drue Stager, MD              Passed - Last BP in normal range    BP Readings from Last 1 Encounters:  09/29/18 136/84          Passed - Last  Heart Rate in normal range    Pulse Readings from Last 1 Encounters:  09/29/18 77

## 2019-10-10 NOTE — Telephone Encounter (Signed)
appt scheduled for 3.10.2021

## 2019-11-08 ENCOUNTER — Ambulatory Visit: Payer: 59 | Admitting: Family Medicine

## 2019-11-11 ENCOUNTER — Ambulatory Visit: Payer: 59 | Attending: Internal Medicine

## 2019-11-11 DIAGNOSIS — Z23 Encounter for immunization: Secondary | ICD-10-CM

## 2019-11-11 NOTE — Progress Notes (Signed)
   Covid-19 Vaccination Clinic  Name:  Sydney [name]    MRN: [id number] DOB: 01/18/1966  11/11/2019  [name] was observed post Covid-19 immunization for 15 minutes without incident. She was provided with Vaccine Information Sheet and instruction to access the V-Safe system.   [name] was instructed to call 911 with any severe reactions post vaccine: Marland Kitchen Difficulty breathing  . Swelling of face and throat  . A fast heartbeat  . A bad rash all over body  . Dizziness and weakness   Immunizations Administered    Name Date Dose VIS Date Route   Pfizer COVID-19 Vaccine 11/11/2019  9:59 AM 0.3 mL 08/11/2019 Intramuscular   Manufacturer: [place]   Lot: [id number]   [place]: [id number]

## 2019-11-22 ENCOUNTER — Encounter: Payer: Self-pay | Admitting: Family Medicine

## 2019-11-22 ENCOUNTER — Other Ambulatory Visit: Payer: Self-pay | Admitting: Family Medicine

## 2019-11-22 ENCOUNTER — Ambulatory Visit: Payer: 59 | Admitting: Family Medicine

## 2019-11-22 ENCOUNTER — Other Ambulatory Visit: Payer: Self-pay

## 2019-11-22 VITALS — BP 126/74 | HR 96 | Temp 97.9°F | Resp 16 | Ht 63.0 in | Wt 171.3 lb

## 2019-11-22 DIAGNOSIS — Z8639 Personal history of other endocrine, nutritional and metabolic disease: Secondary | ICD-10-CM | POA: Diagnosis not present

## 2019-11-22 DIAGNOSIS — E8881 Metabolic syndrome: Secondary | ICD-10-CM

## 2019-11-22 DIAGNOSIS — Z0000 Encounter for general adult medical examination without abnormal findings: Secondary | ICD-10-CM | POA: Diagnosis not present

## 2019-11-22 DIAGNOSIS — E785 Hyperlipidemia, unspecified: Secondary | ICD-10-CM | POA: Diagnosis not present

## 2019-11-22 DIAGNOSIS — H43811 Vitreous degeneration, right eye: Secondary | ICD-10-CM | POA: Diagnosis not present

## 2019-11-22 DIAGNOSIS — Z1231 Encounter for screening mammogram for malignant neoplasm of breast: Secondary | ICD-10-CM

## 2019-11-22 DIAGNOSIS — Z1211 Encounter for screening for malignant neoplasm of colon: Secondary | ICD-10-CM | POA: Diagnosis not present

## 2019-11-22 DIAGNOSIS — I10 Essential (primary) hypertension: Secondary | ICD-10-CM | POA: Diagnosis not present

## 2019-11-22 DIAGNOSIS — L700 Acne vulgaris: Secondary | ICD-10-CM | POA: Diagnosis not present

## 2019-11-22 MED ORDER — HYDROCHLOROTHIAZIDE 12.5 MG PO CAPS: 12.5000 mg | ORAL_CAPSULE | Freq: Every day | ORAL | 3 refills | Status: DC

## 2019-11-22 MED ORDER — CLINDAMYCIN PHOS-BENZOYL PEROX 1.2-5 % EX GEL: 1.0000 g | Freq: Two times a day (BID) | CUTANEOUS | 2 refills | Status: DC

## 2019-11-22 MED ORDER — METOPROLOL SUCCINATE ER 25 MG PO TB24: 12.5000 mg | ORAL_TABLET | Freq: Every day | ORAL | 3 refills | Status: DC

## 2019-11-22 NOTE — Patient Instructions (Signed)

## 2019-11-22 NOTE — Progress Notes (Signed)
Name: Sydney [name]   MRN: [id number]    DOB: [birth year]   Date:11/22/2019       Progress Note  Subjective  Chief Complaint  Chief Complaint  Patient presents with  . Medication Refill  . Hypertension    Denies any symptoms  . Dyslipidemia  . Metabolic Metabolic  . Annual Exam    HPI  Patient presents for annual CPE and follow up  HTN: she has been taking Metoprolol half pill and HCTZ for the past year, she denies dizziness, SOB or chest pain. BP is at goal, no side effects of medication   Dyslipidemia: on diet only, no chest pain or palpitation , we will recheck labs   Metabolic Metabolic: she denies polyphagia, polyuria or polyphagia.She has not been eating at work, but eats healthy at night. We will recheck labs   Vitreous detachment : right eye back in Dec 2020 she had black floaters and light ,  She has  seen Dr. Michaelene Song, doing well.   Diet: discussed a balanced diet, she is eating at work, provided by employer.  Exercise: discussed 150 minutes per week.   USPSTF grade A and B recommendations    Office Visit from 11/22/2019 in [facility]  AUDIT-C Score  0     Depression: Phq 9 is  negative Depression screen [facility] 2/9 11/22/2019 09/29/2018 06/21/2017 06/17/2016 02/27/2015  Decreased Interest 0 0 0 0 0  Down, Depressed, Hopeless 0 0 0 0 0  PHQ - 2 Score 0 0 0 0 0  Altered sleeping - 0 - - -  Tired, decreased energy - 1 - - -  Change in appetite - 0 - - -  Feeling bad or failure about yourself  - 0 - - -  Trouble concentrating - 0 - - -  Moving slowly or fidgety/restless - 0 - - -  Suicidal thoughts - 0 - - -  PHQ-9 Score - 1 - - -  Difficult doing work/chores - Not difficult at all - - -   Hypertension: BP Readings from Last 3 Encounters:  11/22/19 126/74  09/29/18 136/84  06/21/17 120/60   Obesity: Wt Readings from Last 3 Encounters:  11/22/19 171 lb 4.8 oz (77.7 kg)  09/29/18 167 lb 9.6 oz (76 kg)  06/21/17 165 lb 3.2 oz  (74.9 kg)   BMI Readings from Last 3 Encounters:  11/22/19 30.34 kg/m  09/29/18 29.69 kg/m  06/21/17 29.26 kg/m     Hep C Screening: up to date STD testing and prevention (HIV/chl/gon/syphilis): N/A Intimate partner violence: negative screen  Sexual History (Partners/Practices/Protection from [employer] hx STI/Pregnancy Plans): not sexually active  Pain during Intercourse: N/A Menstrual History/LMP/Abnormal Bleeding: LMP in 2016 , discussed importance of coming in if any post-menopausal bleeding  Incontinence Symptoms: no problems  Breast cancer:  - Last Mammogram: 2018  - BRCA gene screening: N/A  Osteoporosis: Discussed high calcium and vitamin D supplementation, weight bearing exercises  Cervical cancer screening: up to date   Skin cancer: Discussed monitoring for atypical lesions  Colorectal cancer: she is due for screening, reminded her to have cologuard done   Lung cancer:   Low Dose CT Chest recommended if Age 27-80 years, 30 pack-year currently smoking OR have quit w/in 15years. Patient does not qualify.   ECG: 08/2018  Advanced Care Planning: A voluntary discussion about advance care planning including the explanation and discussion of advance directives.  Discussed health care proxy and Living will, and the patient was  able to identify a health care proxy as sister .  Patient does not have a living will at present time. If patient does have living will, I have requested they bring this to the clinic to be scanned in to their chart.  Lipids: Lab Results  Component Value Date   CHOL 222 (H) 09/29/2018   CHOL 213 (H) 07/19/2017   CHOL 240 (H) 07/13/2016   Lab Results  Component Value Date   HDL 54 09/29/2018   HDL 69 07/19/2017   HDL 71 07/13/2016   Lab Results  Component Value Date   LDLCALC 145 (H) 09/29/2018   LDLCALC 130 (H) 07/19/2017   LDLCALC 156 (H) 07/13/2016   Lab Results  Component Value Date   TRIG 114 09/29/2018   TRIG 58 07/19/2017   TRIG 64  07/13/2016   Lab Results  Component Value Date   CHOLHDL 4.1 09/29/2018   CHOLHDL 3.1 07/19/2017   CHOLHDL 3.4 07/13/2016   No results found for: LDLDIRECT  Glucose: Glucose, Bld  Date Value Ref Range Status  09/29/2018 81 65 - 99 mg/dL Final    Comment:    .            Fasting reference interval .   07/19/2017 83 65 - 99 mg/dL Final    Comment:    .            Fasting reference interval .   07/13/2016 81 65 - 99 mg/dL Final    Patient Active Problem List   Diagnosis Date Noted  . Low back pain with sciatica 02/24/2015  . Dyslipidemia 02/24/2015  . Dermatitis, eczematoid 02/24/2015  . Acid reflux 02/24/2015  . Benign hypertension 02/24/2015  . Iron deficiency 02/24/2015  . Dysmetabolic syndrome 46/27/0350  . Obesity (BMI 30.0-34.9) 02/24/2015  . Allergic rhinitis 02/24/2015  . Hematuria, microscopic 06/04/2010    Past Surgical History:  Procedure Laterality Date  . URETHRAL DILATION  Age 89    Family History  Problem Relation Age of Onset  . Hypertension Mother   . Hyperlipidemia Mother   . Dementia Mother   . Diabetes Father   . CAD Father   . Psoriasis Father   . Heart disease Father   . Cancer Father        Prostate and Multiple Myeloma  . Asthma Sister   . Thyroid disease Sister   . Diabetes Sister   . Breast cancer Neg Hx     Social History   Socioeconomic History  . Marital status: Single    Spouse name: Not on file  . Number of children: 0  . Years of education: Not on file  . 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 Smoking status: Never Smoker  . Smokeless tobacco: Never Used  Substance and Sexual Activity  . Alcohol use: No    Alcohol/week: 0.0 standard drinks  . Drug use: No  . Sexual activity: Not Currently  Other Topics Concern  . Not on file  Social History Narrative   Works for [employer], lives alone    She has one Neurosurgeon   Mother is in memory care now    Social Determinants of Health   Financial Resource Strain: [place]   . Difficulty of Paying Living Expenses: Not hard at all  Food Insecurity: No Food Insecurity  . Worried About Estate manager/land agent  of Food in the Last Year: Never true  . Ran Out of Food in the Last Year: Never true  Transportation Needs: No Transportation Needs  . Lack of Transportation (Medical): No  . Lack of Transportation (Non-Medical): No  Physical Activity: Inactive  . Days of Exercise per Week: 0 days  . Minutes of Exercise per Session: 0 min  Stress: No Stress Concern Present  . Feeling of Stress : Not at all  Social Connections: Slightly Isolated  . Frequency of Communication with Friends and Family: Three times a week  . Frequency of Social Gatherings with Friends and Family: Once a week  . Attends Religious Services: More than 4 times per year  . Active Member of Clubs or Organizations: Yes  . Attends Archivist Meetings: 1 to 4 times per year  . Marital Status: Never married  Intimate Partner Violence: Not At Risk  . Fear of Current or Ex-Partner: No  . Emotionally Abused: No  . Physically Abused: No  . Sexually Abused: No     Current Outpatient Medications:  .  Calcium-Phosphorus-Vitamin D (CITRACAL +D3 PO), Take 2 tablets by mouth daily., Disp: , Rfl:  .  Clindamycin-Benzoyl Per, Refr, gel, Apply 1 g topically 2 (two) times daily., Disp: 45 g, Rfl: 0 .  hydrochlorothiazide (MICROZIDE) 12.5 MG capsule, TAKE 1 CAPSULE BY MOUTH DAILY., Disp: 30 capsule, Rfl: 0 .  ibuprofen (ADVIL,MOTRIN) 100 MG tablet, Take 100 mg by mouth 2 (two) times daily as needed for fever., Disp: , Rfl:  .  metoprolol succinate (TOPROL-XL) 25 MG 24 hr tablet, TAKE 1/2 TABLET (12.5 MG) BY MOUTH DAILY., Disp: 15 tablet, Rfl: 0 .  Misc Natural Products (AIRBORNE ELDERBERRY) CHEW, Chew 2 each by mouth daily. Nature's Bounty Elderberry Gummies, Disp: , Rfl:  .  Misc Natural  Products (LUTEIN 20 PO), Take 1 each by mouth daily., Disp: , Rfl:  .  Multiple Vitamins-Calcium (ONE-A-DAY WOMENS PO), Take 2 each by mouth daily. Gummy, Disp: , Rfl:  .  OVER THE COUNTER MEDICATION, Take 2 each by mouth 2 (two) times daily. Bio Complete 3, Disp: , Rfl:   Allergies  Allergen Reactions  . Amoxicillin   . Sulfa Antibiotics      ROS  Constitutional: Negative for fever or weight change.  Respiratory: Negative for cough and shortness of breath.   Cardiovascular: Negative for chest pain or palpitations.  Gastrointestinal: Negative for abdominal pain, no bowel changes.  Musculoskeletal: Negative for gait problem or joint swelling.  Skin: Negative for rash.  Neurological: Negative for dizziness or headache.  No other specific complaints in a complete review of systems (except as listed in HPI above).  Objective  Vitals:   11/22/19 0937  BP: 126/74  Pulse: 96  Resp: 16  Temp: 97.9 F (36.6 C)  TempSrc: Temporal  SpO2: 96%  Weight: 171 lb 4.8 oz (77.7 kg)  Height: '5\' 3"'$  (1.6 m)    Body mass index is 30.34 kg/m.  Physical Exam  Constitutional: Patient appears well-developed and well-nourished. No distress.  HENT: Head: Normocephalic and atraumatic. Ears: B TMs ok, no erythema or effusion; Nose: Nose normal. Mouth/Throat: not done  Eyes: Conjunctivae and EOM are normal. Pupils are equal, round, and reactive to light. No scleral icterus.  Neck: Normal range of motion. Neck supple. No JVD present. No thyromegaly present.  Cardiovascular: Normal rate, regular rhythm and normal heart sounds.  No murmur heard. No BLE edema. Pulmonary/Chest: Effort normal and breath sounds normal. No  respiratory distress. Abdominal: Soft. Bowel sounds are normal, no distension. There is no tenderness. no masses Breast: no lumps or masses, no nipple discharge or rashes FEMALE GENITALIA:  Not done  RECTAL: not done  Musculoskeletal: Normal range of motion, no joint effusions. No  gross deformities Neurological: he is alert and oriented to person, place, and time. No cranial nerve deficit. Coordination, balance, strength, speech and gait are normal.  Skin: Skin is warm and dry. No rash noted. No erythema.  Psychiatric: Patient has a normal mood and affect. behavior is normal. Judgment and thought content normal.  Fall Risk: Fall Risk  11/22/2019 09/29/2018 06/21/2017 06/17/2016 02/27/2015  Falls in the past year? 1 0 No No No  Comment Fell on Ice - - - -  Number falls in past yr: 0 - - - -  Injury with Fall? 0 - - - -     Functional Status Survey: Is the patient deaf or have difficulty hearing?: No Does the patient have difficulty seeing, even when wearing glasses/contacts?: Yes(right eye cloudness) Does the patient have difficulty concentrating, remembering, or making decisions?: No Does the patient have difficulty walking or climbing stairs?: No Does the patient have difficulty dressing or bathing?: No Does the patient have difficulty doing errands alone such as visiting a doctor's office or shopping?: No   Assessment & Plan  1. Benign hypertension  - metoprolol succinate (TOPROL-XL) 25 MG 24 hr tablet; Take 0.5 tablets (12.5 mg total) by mouth daily.  Dispense: 45 tablet; Refill: 3 - hydrochlorothiazide (MICROZIDE) 12.5 MG capsule; Take 1 capsule (12.5 mg total) by mouth daily.  Dispense: 90 capsule; Refill: 3 - COMPLETE METABOLIC PANEL WITH GFR - CBC with Differential/Platelet  2. Dyslipidemia  - Lipid panel  3. Colon cancer screening  - Cologuard  4. Dysmetabolic syndrome  - Hemoglobin A1c  5. History of iron deficiency   6. Well adult health check   7. Encounter for screening mammogram for malignant neoplasm of breast  - MM Digital Screening; Future  8. Acne vulgaris  - Clindamycin-Benzoyl Per, Refr, gel; Apply 1 g topically 2 (two) times daily.  Dispense: 45 g; Refill: 2  9. Posterior vitreous detachment, right eye   USPSTF  grade A and B recommendations reviewed with patient; age-appropriate recommendations, preventive care, screening tests, etc discussed and encouraged; healthy living encouraged; see AVS for patient education given to patient -Discussed importance of 150 minutes of physical activity weekly, eat two servings of fish weekly, eat one serving of tree nuts ( cashews, pistachios, pecans, almonds.Marland Kitchen) every other day, eat 6 servings of fruit/vegetables daily and drink plenty of water and avoid sweet beverages.

## 2019-11-23 LAB — COMPLETE METABOLIC PANEL WITH GFR
AG Ratio: 1.6 (calc) (ref 1.0–2.5)
ALT: 20 U/L (ref 6–29)
AST: 24 U/L (ref 10–35)
Albumin: 4.2 g/dL (ref 3.6–5.1)
Alkaline phosphatase (APISO): 64 U/L (ref 37–153)
BUN: 21 mg/dL (ref 7–25)
CO2: 32 mmol/L (ref 20–32)
Calcium: 9.1 mg/dL (ref 8.6–10.4)
Chloride: 101 mmol/L (ref 98–110)
Creat: 0.62 mg/dL (ref 0.50–1.05)
GFR, Est African American: 119 mL/min/{1.73_m2} (ref >=60)
GFR, Est Non African American: 103 mL/min/{1.73_m2} (ref >=60)
Globulin: 2.7 g/dL (calc) (ref 1.9–3.7)
Glucose, Bld: 82 mg/dL (ref 65–99)
Potassium: 3.6 mmol/L (ref 3.5–5.3)
Sodium: 140 mmol/L (ref 135–146)
Total Bilirubin: 0.4 mg/dL (ref 0.2–1.2)
Total Protein: 6.9 g/dL (ref 6.1–8.1)

## 2019-11-23 LAB — LIPID PANEL
Cholesterol: 220 mg/dL — ABNORMAL HIGH (ref <200)
HDL: 62 mg/dL (ref >=50)
LDL Cholesterol (Calc): 143 mg/dL (calc) — ABNORMAL HIGH
Non-HDL Cholesterol (Calc): 158 mg/dL (calc) — ABNORMAL HIGH (ref <130)
Total CHOL/HDL Ratio: 3.5 (calc) (ref <5.0)
Triglycerides: 60 mg/dL (ref <150)

## 2019-11-23 LAB — HEMOGLOBIN A1C
Hgb A1c MFr Bld: 5.6 % of total Hgb (ref <5.7)
Mean Plasma Glucose: 114 (calc)
eAG (mmol/L): 6.3 (calc)

## 2019-11-23 LAB — CBC WITH DIFFERENTIAL/PLATELET
Absolute Monocytes: 500 cells/uL (ref 200–950)
Basophils Absolute: 51 cells/uL (ref 0–200)
Basophils Relative: 1 %
Eosinophils Absolute: 301 cells/uL (ref 15–500)
Eosinophils Relative: 5.9 %
HCT: 36.6 % (ref 35.0–45.0)
Hemoglobin: 12.1 g/dL (ref 11.7–15.5)
Lymphs Abs: 831 cells/uL — ABNORMAL LOW (ref 850–3900)
MCH: 29.6 pg (ref 27.0–33.0)
MCHC: 33.1 g/dL (ref 32.0–36.0)
MCV: 89.5 fL (ref 80.0–100.0)
MPV: 12.1 fL (ref 7.5–12.5)
Monocytes Relative: 9.8 %
Neutro Abs: 3417 cells/uL (ref 1500–7800)
Neutrophils Relative %: 67 %
Platelets: 237 10*3/uL (ref 140–400)
RBC: 4.09 10*6/uL (ref 3.80–5.10)
RDW: 12.4 % (ref 11.0–15.0)
Total Lymphocyte: 16.3 %
WBC: 5.1 10*3/uL (ref 3.8–10.8)

## 2019-12-06 ENCOUNTER — Ambulatory Visit: Payer: 59 | Attending: Internal Medicine

## 2019-12-06 DIAGNOSIS — Z23 Encounter for immunization: Secondary | ICD-10-CM

## 2019-12-06 NOTE — Progress Notes (Signed)
   Covid-19 Vaccination Clinic  Name:  Sydney [name]    MRN: [id number] DOB: February 25, 1966  12/06/2019  [name] was observed post Covid-19 immunization for 15 minutes without incident. She was provided with Vaccine Information Sheet and instruction to access the V-Safe system.   [name] was instructed to call 911 with any severe reactions post vaccine: Marland Kitchen Difficulty breathing  . Swelling of face and throat  . A fast heartbeat  . A bad rash all over body  . Dizziness and weakness   Immunizations Administered    Name Date Dose VIS Date Route   Pfizer COVID-19 Vaccine 12/06/2019  9:29 AM 0.3 mL 08/11/2019 Intramuscular   Manufacturer: [place]   Lot: [id number]   [place]: [id number]

## 2020-01-18 DIAGNOSIS — L728 Other follicular cysts of the skin and subcutaneous tissue: Secondary | ICD-10-CM | POA: Diagnosis not present

## 2020-01-18 DIAGNOSIS — D2271 Melanocytic nevi of right lower limb, including hip: Secondary | ICD-10-CM | POA: Diagnosis not present

## 2020-01-18 DIAGNOSIS — D2272 Melanocytic nevi of left lower limb, including hip: Secondary | ICD-10-CM | POA: Diagnosis not present

## 2020-01-18 DIAGNOSIS — D225 Melanocytic nevi of trunk: Secondary | ICD-10-CM | POA: Diagnosis not present

## 2020-01-18 DIAGNOSIS — D2262 Melanocytic nevi of left upper limb, including shoulder: Secondary | ICD-10-CM | POA: Diagnosis not present

## 2020-01-18 DIAGNOSIS — D2261 Melanocytic nevi of right upper limb, including shoulder: Secondary | ICD-10-CM | POA: Diagnosis not present

## 2020-01-18 DIAGNOSIS — L821 Other seborrheic keratosis: Secondary | ICD-10-CM | POA: Diagnosis not present

## 2020-09-07 ENCOUNTER — Ambulatory Visit: Payer: 59 | Attending: Internal Medicine

## 2020-09-07 DIAGNOSIS — Z23 Encounter for immunization: Secondary | ICD-10-CM

## 2020-09-07 NOTE — Progress Notes (Signed)
   Covid-19 Vaccination Clinic  Name:  Sydney [name]    MRN: [id number] DOB: 30-Sep-1965  09/07/2020  [name] was observed post Covid-19 immunization for 15 minutes without incident. She was provided with Vaccine Information Sheet and instruction to access the V-Safe system.   [name] was instructed to call 911 with any severe reactions post vaccine: Marland Kitchen Difficulty breathing  . Swelling of face and throat  . A fast heartbeat  . A bad rash all over body  . Dizziness and weakness   Immunizations Administered    Name Date Dose VIS Date Route   Pfizer COVID-19 Vaccine 09/07/2020 11:01 AM 0.3 mL 06/19/2020 Intramuscular   Manufacturer: [place]   Lot: [id number]   NDC: [address]-4

## 2020-10-29 HISTORY — PX: OTHER SURGICAL HISTORY: SHX169

## 2020-11-22 ENCOUNTER — Encounter: Payer: 59 | Admitting: Family Medicine

## 2020-12-09 ENCOUNTER — Telehealth: Payer: Self-pay | Admitting: Family Medicine

## 2020-12-09 DIAGNOSIS — I10 Essential (primary) hypertension: Secondary | ICD-10-CM

## 2020-12-09 NOTE — Telephone Encounter (Signed)
Requested medications are due for refill today yes  Requested medications are on the active medication list yes  Last refill 1/7  Last visit 10/2019  Future visit scheduled 03/2021  Notes to clinic Next scheduled visit will be 17 months from last visit. Failed protocol due to no valid visit within 6  months.

## 2020-12-10 MED ORDER — HYDROCHLOROTHIAZIDE 12.5 MG PO CAPS
ORAL_CAPSULE | Freq: Every day | ORAL | 0 refills | Status: DC
  Filled 2020-12-10: qty 30, 30d supply, fill #0

## 2020-12-10 MED ORDER — METOPROLOL SUCCINATE ER 25 MG PO TB24
ORAL_TABLET | ORAL | 0 refills | Status: DC
  Filled 2020-12-10: qty 15, 30d supply, fill #0

## 2020-12-11 ENCOUNTER — Other Ambulatory Visit: Payer: Self-pay

## 2020-12-11 NOTE — Progress Notes (Signed)
Name: Sydney [name]   MRN: [id number]    DOB: May 20, 1966   Date:12/12/2020       Progress Note  Subjective  Chief Complaint  Medication Refill  HPI  HTN: she has been taking Metoprolol half pill and HCTZ for the past year, she denies dizziness, SOB or chest pain. She has been taking medications as prescribed .   Dyslipidemia: on diet only, no chest pain or palpitation , we will recheck labs during her CPE, she does not want to do it today    Metabolic Metabolic: she denies polyphagia, polyuria or polyphagia.She states she has been eating healthier   Vitreous detachment : right eye back in Dec 2020 she had black floaters and light ,  She has  seen Dr. Michaelene Song, recently found that she also has floaters on that eye    Patient Active Problem List   Diagnosis Date Noted  . Low back pain with sciatica 02/24/2015  . Dyslipidemia 02/24/2015  . Dermatitis, eczematoid 02/24/2015  . Acid reflux 02/24/2015  . Benign hypertension 02/24/2015  . Iron deficiency 02/24/2015  . Dysmetabolic syndrome 53/61/4431  . Obesity (BMI 30.0-34.9) 02/24/2015  . Allergic rhinitis 02/24/2015  . Hematuria, microscopic 06/04/2010    Past Surgical History:  Procedure Laterality Date  . URETHRAL DILATION  Age 22    Family History  Problem Relation Age of Onset  . Hypertension Mother   . Hyperlipidemia Mother   . Dementia Mother   . Diabetes Father   . CAD Father   . Psoriasis Father   . Heart disease Father   . Cancer Father        Prostate and Multiple Myeloma  . Asthma Sister   . Thyroid disease Sister   . Diabetes Sister   . Breast cancer Neg Hx     Social History   Tobacco Use  . Smoking status: Never Smoker  . Smokeless tobacco: Never Used  Substance Use Topics  . Alcohol use: No    Alcohol/week: 0.0 standard drinks     Current Outpatient Medications:  .  Calcium-Phosphorus-Vitamin D (CITRACAL +D3 PO), Take 2 tablets by mouth daily., Disp: , Rfl:  .   Clindamycin-Benzoyl Per, Refr, gel, Apply 1 g topically 2 (two) times daily., Disp: 45 g, Rfl: 2 .  hydrochlorothiazide (MICROZIDE) 12.5 MG capsule, TAKE 1 CAPSULE BY MOUTH DAILY., Disp: 30 capsule, Rfl: 0 .  ibuprofen (ADVIL,MOTRIN) 100 MG tablet, Take 100 mg by mouth 2 (two) times daily as needed for fever., Disp: , Rfl:  .  metoprolol succinate (TOPROL-XL) 25 MG 24 hr tablet, TAKE 1/2 TABLET (12.5 MG) BY MOUTH DAILY., Disp: 15 tablet, Rfl: 0 .  Misc Natural Products (AIRBORNE ELDERBERRY) CHEW, Chew 2 each by mouth daily. Nature's Bounty Elderberry Gummies, Disp: , Rfl:  .  Misc Natural Products (LUTEIN 20 PO), Take 1 each by mouth daily., Disp: , Rfl:  .  Multiple Vitamins-Minerals (CENTRUM ADULTS PO), Take by mouth., Disp: , Rfl:  .  OVER THE COUNTER MEDICATION, Take 2 each by mouth 2 (two) times daily. Bio Complete 3, Disp: , Rfl:   Allergies  Allergen Reactions  . Amoxicillin   . Sulfa Antibiotics     I personally reviewed active problem list, medication list, allergies, family history, social history with the patient/caregiver today.   ROS  Constitutional: Negative for fever or weight change.  Respiratory: Negative for cough and shortness of breath.   Cardiovascular: Negative for chest pain or palpitations.  Gastrointestinal: Negative for  abdominal pain, no bowel changes.  Musculoskeletal: Negative for gait problem or joint swelling.  Skin: Negative for rash.  Neurological: Negative for dizziness or headache.  No other specific complaints in a complete review of systems (except as listed in HPI above).  Objective  Vitals:   12/12/20 0749  BP: 134/86  Pulse: 86  Resp: 16  Temp: 98.2 F (36.8 C)  TempSrc: Oral  SpO2: 98%  Weight: 169 lb (76.7 kg)  Height: $Remove'5\' 3"'[id number]$  (1.6 m)    Body mass index is 29.94 kg/m.  Physical Exam  Constitutional: Patient appears well-developed and well-nourished. Overweight  No distress.  HEENT: head atraumatic, normocephalic, pupils equal  and reactive to light,  neck supple Cardiovascular: Normal rate, regular rhythm and normal heart sounds.  No murmur heard. No BLE edema. Pulmonary/Chest: Effort normal and breath sounds normal. No respiratory distress. Abdominal: Soft.  There is no tenderness. Psychiatric: Patient has a normal mood and affect. behavior is normal. Judgment and thought content normal.  PHQ2/9: Depression screen [facility] 2/9 12/12/2020 11/22/2019 09/29/2018 06/21/2017 06/17/2016  Decreased Interest 0 0 0 0 0  Down, Depressed, Hopeless 0 0 0 0 0  PHQ - 2 Score 0 0 0 0 0  Altered sleeping - - 0 - -  Tired, decreased energy - - 1 - -  Change in appetite - - 0 - -  Feeling bad or failure about yourself  - - 0 - -  Trouble concentrating - - 0 - -  Moving slowly or fidgety/restless - - 0 - -  Suicidal thoughts - - 0 - -  PHQ-9 Score - - 1 - -  Difficult doing work/chores - - Not difficult at all - -    phq 9 is negative   Fall Risk: Fall Risk  12/12/2020 11/22/2019 09/29/2018 06/21/2017 06/17/2016  Falls in the past year? 0 1 0 No No  Comment - Fell on [employer] - - -  Number falls in past yr: 0 0 - - -  Injury with Fall? 0 0 - - -     Functional Status Survey: Is the patient deaf or have difficulty hearing?: No Does the patient have difficulty seeing, even when wearing glasses/contacts?: No Does the patient have difficulty concentrating, remembering, or making decisions?: No Does the patient have difficulty walking or climbing stairs?: No Does the patient have difficulty dressing or bathing?: No Does the patient have difficulty doing errands alone such as visiting a doctor's office or shopping?: No    Assessment & Plan  1. Benign hypertension  - metoprolol succinate (TOPROL-XL) 25 MG 24 hr tablet; Take 0.5 tablets (12.5 mg total) by mouth daily.  Dispense: 45 tablet; Refill: 1 - hydrochlorothiazide (MICROZIDE) 12.5 MG capsule; TAKE 1 CAPSULE BY MOUTH DAILY.  Dispense: 90 capsule; Refill: 1  2. Dysmetabolic  syndrome  Continue life style modification   3. Dyslipidemia  On diet only, recheck labs next visit   4. Colon cancer screening  - Fecal Globin By Immunochemistry  5. Breast cancer screening by mammogram  - MM 3D SCREEN BREAST BILATERAL; Future  6. Posterior vitreous detachment, right eye  She now has a floater now

## 2020-12-11 NOTE — Telephone Encounter (Signed)
Called pt to get her scheduled and MB is full. Pt does have an appt on 03/31/21

## 2020-12-12 ENCOUNTER — Other Ambulatory Visit: Payer: Self-pay

## 2020-12-12 ENCOUNTER — Ambulatory Visit: Payer: 59 | Admitting: Family Medicine

## 2020-12-12 ENCOUNTER — Encounter: Payer: Self-pay | Admitting: Family Medicine

## 2020-12-12 VITALS — BP 134/86 | HR 86 | Temp 98.2°F | Resp 16 | Ht 63.0 in | Wt 169.0 lb

## 2020-12-12 DIAGNOSIS — I10 Essential (primary) hypertension: Secondary | ICD-10-CM

## 2020-12-12 DIAGNOSIS — H43811 Vitreous degeneration, right eye: Secondary | ICD-10-CM | POA: Diagnosis not present

## 2020-12-12 DIAGNOSIS — E785 Hyperlipidemia, unspecified: Secondary | ICD-10-CM | POA: Diagnosis not present

## 2020-12-12 DIAGNOSIS — E8881 Metabolic syndrome: Secondary | ICD-10-CM

## 2020-12-12 DIAGNOSIS — Z1231 Encounter for screening mammogram for malignant neoplasm of breast: Secondary | ICD-10-CM

## 2020-12-12 DIAGNOSIS — Z1211 Encounter for screening for malignant neoplasm of colon: Secondary | ICD-10-CM

## 2020-12-12 MED ORDER — HYDROCHLOROTHIAZIDE 12.5 MG PO CAPS
ORAL_CAPSULE | Freq: Every day | ORAL | 1 refills | Status: DC
  Filled 2020-12-12: qty 90, fill #0
  Filled 2021-01-14: qty 90, 90d supply, fill #0
  Filled 2021-04-16: qty 90, 90d supply, fill #1

## 2020-12-12 MED ORDER — METOPROLOL SUCCINATE ER 25 MG PO TB24
12.5000 mg | ORAL_TABLET | Freq: Every day | ORAL | 1 refills | Status: DC
  Filled 2020-12-12 – 2021-01-14 (×2): qty 45, 90d supply, fill #0
  Filled 2021-04-16: qty 45, 90d supply, fill #1

## 2020-12-16 ENCOUNTER — Other Ambulatory Visit: Payer: Self-pay

## 2020-12-19 ENCOUNTER — Other Ambulatory Visit: Payer: Self-pay

## 2020-12-21 DIAGNOSIS — Z1211 Encounter for screening for malignant neoplasm of colon: Secondary | ICD-10-CM | POA: Diagnosis not present

## 2020-12-25 LAB — FECAL OCCULT BLOOD, GUAIAC: Fecal Occult Blood: NEGATIVE

## 2020-12-26 LAB — FECAL GLOBIN BY IMMUNOCHEMISTRY
FECAL GLOBIN RESULT:: NOT DETECTED
MICRO NUMBER:: 11820648
SPECIMEN QUALITY:: ADEQUATE

## 2021-01-03 ENCOUNTER — Other Ambulatory Visit: Payer: Self-pay

## 2021-01-07 ENCOUNTER — Ambulatory Visit
Admission: RE | Admit: 2021-01-07 | Discharge: 2021-01-07 | Disposition: A | Discharge status: Home / Self Care | Payer: 59 | Source: Ambulatory Visit | Attending: Family Medicine | Admitting: Family Medicine

## 2021-01-07 ENCOUNTER — Other Ambulatory Visit: Payer: Self-pay

## 2021-01-07 DIAGNOSIS — Z1231 Encounter for screening mammogram for malignant neoplasm of breast: Secondary | ICD-10-CM | POA: Diagnosis not present

## 2021-01-14 ENCOUNTER — Other Ambulatory Visit: Payer: Self-pay

## 2021-01-17 ENCOUNTER — Other Ambulatory Visit: Payer: Self-pay

## 2021-01-17 DIAGNOSIS — H01134 Eczematous dermatitis of left upper eyelid: Secondary | ICD-10-CM | POA: Diagnosis not present

## 2021-01-17 DIAGNOSIS — L988 Other specified disorders of the skin and subcutaneous tissue: Secondary | ICD-10-CM | POA: Diagnosis not present

## 2021-01-17 DIAGNOSIS — L821 Other seborrheic keratosis: Secondary | ICD-10-CM | POA: Diagnosis not present

## 2021-01-17 DIAGNOSIS — H01131 Eczematous dermatitis of right upper eyelid: Secondary | ICD-10-CM | POA: Diagnosis not present

## 2021-01-17 DIAGNOSIS — D2262 Melanocytic nevi of left upper limb, including shoulder: Secondary | ICD-10-CM | POA: Diagnosis not present

## 2021-01-17 DIAGNOSIS — D2261 Melanocytic nevi of right upper limb, including shoulder: Secondary | ICD-10-CM | POA: Diagnosis not present

## 2021-01-17 DIAGNOSIS — D225 Melanocytic nevi of trunk: Secondary | ICD-10-CM | POA: Diagnosis not present

## 2021-01-17 DIAGNOSIS — D2271 Melanocytic nevi of right lower limb, including hip: Secondary | ICD-10-CM | POA: Diagnosis not present

## 2021-01-17 DIAGNOSIS — D2272 Melanocytic nevi of left lower limb, including hip: Secondary | ICD-10-CM | POA: Diagnosis not present

## 2021-01-17 MED ORDER — DESONIDE 0.05 % EX OINT
TOPICAL_OINTMENT | CUTANEOUS | 0 refills | Status: DC
  Filled 2021-01-17 – 2021-01-22 (×3): qty 15, 30d supply, fill #0

## 2021-01-17 MED ORDER — TRETINOIN 0.025 % EX CREA
TOPICAL_CREAM | CUTANEOUS | 3 refills | Status: DC
  Filled 2021-01-17 – 2021-01-21 (×3): qty 20, 30d supply, fill #0

## 2021-01-21 ENCOUNTER — Other Ambulatory Visit: Payer: Self-pay

## 2021-01-22 ENCOUNTER — Other Ambulatory Visit: Payer: Self-pay

## 2021-02-12 DIAGNOSIS — Z719 Counseling, unspecified: Secondary | ICD-10-CM

## 2021-03-20 ENCOUNTER — Other Ambulatory Visit: Payer: Self-pay

## 2021-03-27 ENCOUNTER — Telehealth: Payer: Self-pay

## 2021-03-27 ENCOUNTER — Ambulatory Visit: Payer: Self-pay | Admitting: *Deleted

## 2021-03-27 NOTE — Telephone Encounter (Signed)
Please advise pt what the covid policy here is

## 2021-03-27 NOTE — Telephone Encounter (Signed)
Patient is calling to report she has COVID exposure at work- advised per [facility] guidelines for testing, isolation and symptoms. Patient does have appointment for physical- she will reschedule.

## 2021-03-27 NOTE — Telephone Encounter (Signed)
Copied from [place] [phone]. Topic: Appointment Scheduling - Scheduling Inquiry for Clinic >> Mar 27, 2021 12:33 PM Oneta Rack wrote: Patient found out today she was exposed to [place] by her co worker, patient is asymptomatic. Patient unsure if she should [facility] her CPE appointment for 03/31/2021 with PCP.

## 2021-03-27 NOTE — Telephone Encounter (Signed)
Reason for Disposition  [1] CLOSE CONTACT COVID-19 EXPOSURE within last 14 days AND [2] NO symptoms  Answer Assessment - Initial Assessment Questions 1. COVID-19 EXPOSURE: "Please describe how you were exposed to someone with a COVID-19 infection."     Coworker tested + COVID 2. PLACE of CONTACT: "Where were you when you were exposed to COVID-19?" (e.g., home, school, medical waiting room; which city?)     work 3. TYPE of CONTACT: "How much contact was there?" (e.g., sitting next to, live in same house, work in same office, same building)     Work- sit next to coworker 4. DURATION of CONTACT: "How long were you in contact with the COVID-19 patient?" (e.g., a few seconds, passed by person, a few minutes, 15 minutes or longer, live with the patient)     Longer- daily 5. MASK: "Were you wearing a mask?" "Was the other person wearing a mask?" Note: wearing a mask reduces the risk of an otherwise close contact.     Usually wears mask- patient wears mask 6. DATE of CONTACT: "When did you have contact with a COVID-19 patient?" (e.g., how many days ago)     yesterday 7. COMMUNITY SPREAD: "Are there lots of cases of COVID-19 (community spread) where you live?" (See public health department website, if unsure)       yes 8. SYMPTOMS: "Do you have any symptoms?" (e.g., fever, cough, breathing difficulty, loss of taste or smell)     No symptoms 9. VACCINE: "Have you gotten the COVID-19 vaccine?" If Yes, ask: "Which one, how many shots, when did you get it?"     Yes- Pfizer  10. BOOSTER: "Have you received your COVID-19 booster?" If Yes, ask: "Which one and when did you get it?"       1 booster- 09/07/20 11. PREGNANCY OR POSTPARTUM: "Is there any chance you are pregnant?" "When was your last menstrual period?" "Did you deliver in the last 2 weeks?"       N/a 12. HIGH RISK: "Do you have any heart or lung problems?" (e.g., asthma , COPD, heart failure) "Do you have a weak immune system or other risk  factors?" (e.g., HIV positive, chemotherapy, renal failure, diabetes mellitus, sickle cell anemia, obesity)       no 13. TRAVEL: "Have you traveled out of the country recently?" If Yes, ask: "When and where?"  Note: Travel becomes less relevant if there is widespread community transmission where the patient lives.       N/a  Protocols used: Coronavirus ([id number]) Exposure-A-AH

## 2021-03-27 NOTE — Telephone Encounter (Signed)
Please advise 

## 2021-03-28 NOTE — Progress Notes (Deleted)
Name: Sydney [name]   MRN: [id number]    DOB: 1965-09-12   Date:03/28/2021       Progress Note  Subjective  Chief Complaint  Annual Exam  HPI  Patient presents for annual CPE.  Diet: *** Exercise: ***   Flowsheet Row Office Visit from 11/22/2019 in [facility]  AUDIT-C Score 0      Depression: Phq 9 is  {Desc; negative/positive:13464} Depression screen [facility] 2/9 12/12/2020 11/22/2019 09/29/2018 06/21/2017 06/17/2016  Decreased Interest 0 0 0 0 0  Down, Depressed, Hopeless 0 0 0 0 0  PHQ - 2 Score 0 0 0 0 0  Altered sleeping - - 0 - -  Tired, decreased energy - - 1 - -  Change in appetite - - 0 - -  Feeling bad or failure about yourself  - - 0 - -  Trouble concentrating - - 0 - -  Moving slowly or fidgety/restless - - 0 - -  Suicidal thoughts - - 0 - -  PHQ-9 Score - - 1 - -  Difficult doing work/chores - - Not difficult at all - -   Hypertension: BP Readings from Last 3 Encounters:  12/12/20 134/86  11/22/19 126/74  09/29/18 136/84   Obesity: Wt Readings from Last 3 Encounters:  12/12/20 169 lb (76.7 kg)  11/22/19 171 lb 4.8 oz (77.7 kg)  09/29/18 167 lb 9.6 oz (76 kg)   BMI Readings from Last 3 Encounters:  12/12/20 29.94 kg/m  11/22/19 30.34 kg/m  09/29/18 29.69 kg/m     Vaccines:  HPV: up to at age 93 , ask insurance if age between 16-45  Shingrix: 36-64 yo and ask insurance if covered when patient above 54 yo Pneumonia: educated and discussed with patient. Flu: educated and discussed with patient.  Hep C Screening: 09/29/18 STD testing and prevention (HIV/chl/gon/syphilis): 09/29/18 Intimate partner violence:negative Sexual History : Menstrual History/LMP/Abnormal Bleeding:  Incontinence Symptoms:   Breast cancer:  - Last Mammogram: 01/07/21 - BRCA gene screening: N/A  Osteoporosis: Discussed high calcium and vitamin D supplementation, weight bearing exercises  Cervical cancer screening: 09/29/18  Skin cancer: Discussed  monitoring for atypical lesions  Colorectal cancer: N/A   Lung cancer:  Low Dose CT Chest recommended if Age 12-80 years, 20 pack-year currently smoking OR have quit w/in 15years. Patient does not qualify.   ECG: 09/29/18  Advanced Care Planning: A voluntary discussion about advance care planning including the explanation and discussion of advance directives.  Discussed health care proxy and Living will, and the patient was able to identify a health care proxy as ***.  Patient does not have a living will at present time. If patient does have living will, I have requested they bring this to the clinic to be scanned in to their chart.  Lipids: Lab Results  Component Value Date   CHOL 220 (H) 11/22/2019   CHOL 222 (H) 09/29/2018   CHOL 213 (H) 07/19/2017   Lab Results  Component Value Date   HDL 62 11/22/2019   HDL 54 09/29/2018   HDL 69 07/19/2017   Lab Results  Component Value Date   LDLCALC 143 (H) 11/22/2019   LDLCALC 145 (H) 09/29/2018   LDLCALC 130 (H) 07/19/2017   Lab Results  Component Value Date   TRIG 60 11/22/2019   TRIG 114 09/29/2018   TRIG 58 07/19/2017   Lab Results  Component Value Date   CHOLHDL 3.5 11/22/2019   CHOLHDL 4.1 09/29/2018   CHOLHDL 3.1  07/19/2017   No results found for: LDLDIRECT  Glucose: Glucose, Bld  Date Value Ref Range Status  11/22/2019 82 65 - 99 mg/dL Final    Comment:    .            Fasting reference interval .   09/29/2018 81 65 - 99 mg/dL Final    Comment:    .            Fasting reference interval .   07/19/2017 83 65 - 99 mg/dL Final    Comment:    .            Fasting reference interval .     Patient Active Problem List   Diagnosis Date Noted   Low back pain with sciatica 02/24/2015   Dyslipidemia 02/24/2015   Dermatitis, eczematoid 02/24/2015   Acid reflux 02/24/2015   Benign hypertension 02/24/2015   Iron deficiency 10/29/3141   Dysmetabolic syndrome 88/87/5797   Obesity (BMI 30.0-34.9) 02/24/2015    Allergic rhinitis 02/24/2015   Hematuria, microscopic 06/04/2010    Past Surgical History:  Procedure Laterality Date   oral surgery  Left 10/2020   molar tooth implant    URETHRAL DILATION  Age 19    Family History  Problem Relation Age of Onset   Hypertension Mother    Hyperlipidemia Mother    Dementia Mother    Diabetes Father    CAD Father    Psoriasis Father    Heart disease Father    Cancer Father        Prostate and Multiple Myeloma   Asthma Sister    Thyroid disease Sister    Diabetes Sister    Breast cancer Neg Hx     Social History   Socioeconomic History   Marital status: Single    Spouse name: Not on file   Number of children: 0   Years of education: Not on file   Highest education level: Bachelor's degree (e.g., BA, AB, BS)  Occupational History    Employer: [employer]    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  Tobacco Use   Smoking status: Never   Smokeless tobacco: Never  Vaping Use   Vaping Use: Never used  Substance and Sexual Activity   Alcohol use: No    Alcohol/week: 0.0 standard drinks   Drug use: No   Sexual activity: Not Currently  Other Topics Concern   Not on file  Social History Narrative   Works for [employer], lives alone   She has one cat   Mother is in memory care now    Social Determinants of Health   Financial Resource Strain: Not on file  Food Insecurity: Not on file  Transportation Needs: Not on file  Physical Activity: Not on file  Stress: Not on file  Social Connections: Not on file  Intimate Partner Violence: Not on file     Current Outpatient Medications:    Calcium-Phosphorus-Vitamin D (CITRACAL +D3 PO), Take 2 tablets by mouth daily., Disp: , Rfl:    Clindamycin-Benzoyl Per, Refr, gel, Apply 1 g topically 2 (two) times daily., Disp: 45 g, Rfl: 2   desonide (DESOWEN) 0.05 % ointment, Apply twice daily to affected eyelids until clear, and then discontinue, Disp: 15 g, Rfl: 0   hydrochlorothiazide (MICROZIDE) 12.5  MG capsule, TAKE 1 CAPSULE BY MOUTH DAILY., Disp: 90 capsule, Rfl: 1   ibuprofen (ADVIL,MOTRIN) 100 MG tablet, Take 100 mg by mouth 2 (two) times daily as needed for fever., Disp: ,  Rfl:    metoprolol succinate (TOPROL-XL) 25 MG 24 hr tablet, Take 0.5 tablets (12.5 mg total) by mouth daily., Disp: 45 tablet, Rfl: 1   Misc Natural Products (AIRBORNE ELDERBERRY) CHEW, Chew 2 each by mouth daily. Nature's Bounty Elderberry Gummies, Disp: , Rfl:    Misc Natural Products (LUTEIN 20 PO), Take 1 each by mouth daily., Disp: , Rfl:    Multiple Vitamins-Minerals (CENTRUM ADULTS PO), Take by mouth., Disp: , Rfl:    OVER THE COUNTER MEDICATION, Take 2 each by mouth 2 (two) times daily. Bio Complete 3, Disp: , Rfl:    tretinoin (RETIN-A) 0.025 % cream, Apply a pea size amount to dry face nightly, Disp: 20 g, Rfl: 3  Allergies  Allergen Reactions   Amoxicillin    Sulfa Antibiotics      ROS  ***  Objective  There were no vitals filed for this visit.  There is no height or weight on file to calculate BMI.  Physical Exam ***  No results found for this or any previous visit (from the past 2160 hour(s)).  Diabetic Foot Exam: Diabetic Foot Exam - Simple   No data filed    ***  Fall Risk: Fall Risk  12/12/2020 11/22/2019 09/29/2018 06/21/2017 06/17/2016  Falls in the past year? 0 1 0 No No  Comment - Fell on [employer] - - -  Number falls in past yr: 0 0 - - -  Injury with Fall? 0 0 - - -   ***  Functional Status Survey:   ***  Assessment & Plan  1. Well adult exam ***   -USPSTF grade A and B recommendations reviewed with patient; age-appropriate recommendations, preventive care, screening tests, etc discussed and encouraged; healthy living encouraged; see AVS for patient education given to patient -Discussed importance of 150 minutes of physical activity weekly, eat two servings of fish weekly, eat one serving of tree nuts ( cashews, pistachios, pecans, almonds.Marland Kitchen) every other day, eat 6  servings of fruit/vegetables daily and drink plenty of water and avoid sweet beverages.   -Reviewed Health Maintenance: ***

## 2021-03-31 ENCOUNTER — Encounter: Payer: 59 | Admitting: Family Medicine

## 2021-04-14 ENCOUNTER — Encounter: Payer: 59 | Admitting: Family Medicine

## 2021-04-17 ENCOUNTER — Other Ambulatory Visit: Payer: Self-pay

## 2021-04-21 ENCOUNTER — Ambulatory Visit: Payer: 59 | Admitting: Family Medicine

## 2021-05-06 NOTE — Progress Notes (Signed)
[place] [address] [place] Phone: [phone] Subjective:    I'm seeing this patient by the request  of:  Steele Sizer, MD I, Vilma Meckel, am serving as a scribe for Dr. Hulan Saas. This visit occurred during the SARS-CoV-2 public health emergency.  Safety protocols were in place, including screening questions prior to the visit, additional usage of staff PPE, and extensive cleaning of exam room while observing appropriate contact time as indicated for disinfecting solutions.   CC: right ankle and leg pain   SNK:[id number]  Sydney [name] is a 55 y.o. female coming in with complaint of right ankle pain. Pain in calcaneal region and going up the achilles. Swelling started some time over the summer. She current wears inserts, does water aerobics, and ice the area to find momentary relief. She has had prior pain in the bottom of her foot.       Past Medical History:  Diagnosis Date   Allergy    Anemia    Eczema    GERD (gastroesophageal reflux disease)    Hematuria    Hyperlipidemia    Hypertension    Kidney stone on left side    Lump in female breast    seen by Dr. Fleet Contras   Metabolic syndrome    Microscopic hematuria    Vitreous detachment of right eye 08/22/2019   Dr. 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  Tobacco Use   Smoking status: Never   Smokeless tobacco: Never  Vaping Use   Vaping Use: Never used  Substance and Sexual Activity   Alcohol use: No    Alcohol/week: 0.0  standard drinks   Drug use: No   Sexual activity: Not Currently  Other Topics Concern   Not on file  Social History Narrative   Works for [employer], lives alone   She has one Neurosurgeon   Mother is in memory care now    Social Determinants of Health   Financial Resource Strain: Not on file  Food Insecurity: Not on file  Transportation Needs: Not on file  Physical Activity: Not on file  Stress: Not on file  Social Connections: Not on file   Allergies  Allergen Reactions   Amoxicillin    Sulfa Antibiotics    Family History  Problem Relation Age of Onset   Hypertension Mother    Hyperlipidemia Mother    Dementia Mother    Diabetes Father    CAD Father    Psoriasis Father    Heart disease Father    Cancer Father        Prostate and Multiple Myeloma   Asthma Sister    Thyroid disease Sister    Diabetes Sister    Breast cancer Neg Hx      Current Outpatient Medications (Cardiovascular):    hydrochlorothiazide (MICROZIDE) 12.5 MG capsule, TAKE 1 CAPSULE BY MOUTH DAILY.   metoprolol succinate (TOPROL-XL) 25 MG 24 hr tablet, Take 0.5  tablets (12.5 mg total) by mouth daily.   Current Outpatient Medications (Analgesics):    ibuprofen (ADVIL,MOTRIN) 100 MG tablet, Take 100 mg by mouth 2 (two) times daily as needed for fever.   Current Outpatient Medications (Other):    Calcium-Phosphorus-Vitamin D (CITRACAL +D3 PO), Take 2 tablets by mouth daily.   Clindamycin-Benzoyl Per, Refr, gel, Apply 1 g topically 2 (two) times daily.   desonide (DESOWEN) 0.05 % ointment, Apply twice daily to affected eyelids until clear, and then discontinue   Misc Natural Products (AIRBORNE ELDERBERRY) CHEW, Chew 2 each by mouth daily. Nature's Bounty Elderberry Gummies   Misc Natural Products (LUTEIN 20 PO), Take 1 each by mouth daily.   Multiple Vitamins-Minerals (CENTRUM ADULTS PO), Take by mouth.   OVER THE COUNTER MEDICATION, Take 2 each by mouth 2 (two) times daily. Bio Complete 3   tretinoin  (RETIN-A) 0.025 % cream, Apply a pea size amount to dry face nightly   Reviewed prior external information including notes and imaging from  primary care provider As well as notes that were available from care everywhere and other healthcare systems.  Past medical history, social, surgical and family history all reviewed in electronic medical record.  No pertanent information unless stated regarding to the chief complaint.   Review of Systems:  No headache, visual changes, nausea, vomiting, diarrhea, constipation, dizziness, abdominal pain, skin rash, fevers, chills, night sweats, weight loss, swollen lymph nodes, body aches, joint swelling, chest pain, shortness of breath, mood changes. POSITIVE muscle aches  Objective  Last menstrual period 07/21/2015.   General: No apparent distress alert and oriented x3 mood and affect normal, dressed appropriately.  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  Patient is tender to palpation on the posterior aspect of this area and the calcaneal.  Patient has a negative Thompson.  Good range of motion of the ankle.  Somewhat tender though over the Achilles itself.  Patient on the left knee also has some lateral tracking noted of the patella with mild patellar grind.  No instability of the knee.  Limited muscular skeletal ultrasound was performed and interpreted by Hulan Saas, M  Limited ultrasound of patient's Achilles shows the patient does have hypoechoic changes near the insertion of the Achilles on the calcaneal process.  Patient does have calcific changes noted of the tendon.  Significant increase in Doppler flow and neovascularization concerning for interstitial tearing but no significant  retraction of the Achilles. 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  This included exercises focusing on stretching, strengthening, with significant focus on eccentric aspects.   Long term goals include an improvement in range of motion, strength, endurance as well as avoiding reinjury. Patient's frequency would include in 1-2 times a day, 3-5 times a week for a duration of 6-12 weeks. Ankle strengthening that included:  Basic range of motion exercises to allow proper full motion at ankle Stretching of the lower leg and hamstrings  Theraband exercises for the lower leg - inversion, eversion, dorsiflexion and plantarflexion each to be completed with a theraband Balance exercises to increase proprioception Weight bearing exercises to increase strength and balance  Proper technique shown and discussed  handout in great detail with ATC.  All questions were discussed and answered.     Impression and Recommendations:     The above documentation has been reviewed and is accurate and complete Lyndal Pulley, DO

## 2021-05-07 ENCOUNTER — Other Ambulatory Visit: Payer: Self-pay

## 2021-05-07 ENCOUNTER — Encounter: Payer: Self-pay | Admitting: Family Medicine

## 2021-05-07 ENCOUNTER — Ambulatory Visit (INDEPENDENT_AMBULATORY_CARE_PROVIDER_SITE_OTHER): Payer: 59

## 2021-05-07 ENCOUNTER — Ambulatory Visit: Payer: 59 | Admitting: Family Medicine

## 2021-05-07 ENCOUNTER — Ambulatory Visit: Payer: Self-pay

## 2021-05-07 VITALS — BP 122/78 | HR 88 | Ht 63.0 in | Wt 170.0 lb

## 2021-05-07 DIAGNOSIS — M222X2 Patellofemoral disorders, left knee: Secondary | ICD-10-CM

## 2021-05-07 DIAGNOSIS — M7989 Other specified soft tissue disorders: Secondary | ICD-10-CM | POA: Diagnosis not present

## 2021-05-07 DIAGNOSIS — M25571 Pain in right ankle and joints of right foot: Secondary | ICD-10-CM

## 2021-05-07 DIAGNOSIS — M7661 Achilles tendinitis, right leg: Secondary | ICD-10-CM | POA: Diagnosis not present

## 2021-05-07 MED ORDER — MELOXICAM 15 MG PO TABS
15.0000 mg | ORAL_TABLET | Freq: Every day | ORAL | 0 refills | Status: DC
  Filled 2021-05-07: qty 30, 30d supply, fill #0

## 2021-05-07 NOTE — Assessment & Plan Note (Signed)
Reviewed anatomy using anatomical model and how PFS occurs.  Given rehab exercises handout for VMO, hip abductors, core, entire kinetic chain including proprioception exercises.  Could benefit from PT, regular exercise, upright biking, and a PFS knee brace to assist with tracking abnormalities.  

## 2021-05-07 NOTE — Assessment & Plan Note (Signed)
Severe on ultrasound today.  Intrasubstance tearing noted.  We discussed potential cam walker but elected to do more of a pneumatic compression.  Discussed heel lift, home exercises.  Work with Product/process development scientist.  We discussed the possibility of nitroglycerin but history of an atypical migraine.  Patient does have calcific changes and may be a candidate for shockwave therapy.  Discussed formal physical therapy as well.  Follow-up with me again in 6 weeks to see how patient is responding.

## 2021-05-07 NOTE — Patient Instructions (Addendum)
Ankle brace Exercises at least 3x a week Meloxicam 15 mg daily for 2 weeks then as needed Avoid being barefoot Ice when needed See you again in 4-6 weeks

## 2021-05-21 DIAGNOSIS — M25571 Pain in right ankle and joints of right foot: Secondary | ICD-10-CM | POA: Diagnosis not present

## 2021-06-04 NOTE — Progress Notes (Signed)
Sydney [name] [address] [place] Phone: [phone] Subjective:   IVilma Meckel, am serving as a scribe for Dr. Hulan Saas. This visit occurred during the SARS-CoV-2 public health emergency.  Safety protocols were in place, including screening questions prior to the visit, additional usage of staff PPE, and extensive cleaning of exam room while observing appropriate contact time as indicated for disinfecting solutions.   I'm seeing this patient by the request  of:  Steele Sizer, MD  CC:   [id number]  05/07/2021 Severe on ultrasound today.  Intrasubstance tearing noted.  We discussed potential cam walker but elected to do more of a pneumatic compression.  Discussed heel lift, home exercises.  Work with Product/process development scientist.  We discussed the possibility of nitroglycerin but history of an atypical migraine.  Patient does have calcific changes and may be a candidate for shockwave therapy.  Discussed formal physical therapy as well.  Follow-up with me again in 6 weeks to see how patient is responding.  Update 06/05/2021 Sydney [name] is a 55 y.o. female coming in with complaint of R ankle and L knee pain.  Patient was found to have severe Achilles tendinosis as well as some patellofemoral syndrome.  Patient given exercises, bracing, anti-inflammatories.  Patient states feels like its getting better. Inflammation has gone down and hasn't taken any anti-inflammatory medicines over the past week.      Past Medical History:  Diagnosis Date   Allergy    Anemia    Eczema    GERD (gastroesophageal reflux disease)    Hematuria    Hyperlipidemia    Hypertension    Kidney stone on left side    Lump in female breast    seen by Dr. Fleet Contras   Metabolic syndrome    Microscopic hematuria    Vitreous detachment of right eye 08/21/54   Dr. 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  Tobacco Use   Smoking status: Never   Smokeless tobacco: Never  Vaping Use   Vaping Use: Never used  Substance and Sexual Activity   Alcohol use: No    Alcohol/week: 0.0 standard drinks   Drug use: No   Sexual activity: Not Currently  Other Topics Concern   Not on file  Social History Narrative   Works for [employer], lives alone   She has one Neurosurgeon   Mother is in memory care now    Social Determinants of Health   Financial Resource Strain: Not on file  Food Insecurity: Not on file  Transportation Needs: Not on file  Physical Activity: Not on file  Stress: Not on file  Social Connections: Not on file   Allergies  Allergen Reactions   Amoxicillin    Sulfa Antibiotics    Family History  Problem Relation Age of Onset   Hypertension Mother    Hyperlipidemia Mother    Dementia Mother    Diabetes Father    CAD Father  Psoriasis Father    Heart disease Father    Cancer Father        Prostate and Multiple Myeloma   Asthma Sister    Thyroid disease Sister    Diabetes Sister    Breast cancer Neg Hx      Current Outpatient Medications (Cardiovascular):    hydrochlorothiazide (MICROZIDE) 12.5 MG capsule, TAKE 1 CAPSULE BY MOUTH DAILY.   metoprolol succinate (TOPROL-XL) 25 MG 24 hr tablet, Take 0.5 tablets (12.5 mg total) by mouth daily.   Current Outpatient Medications (Analgesics):    ibuprofen (ADVIL,MOTRIN) 100 MG tablet, Take 100 mg by mouth 2 (two) times daily as needed for fever.   meloxicam (MOBIC) 15 MG tablet, Take 1 tablet (15 mg total) by mouth daily.   Current Outpatient Medications  (Other):    Calcium-Phosphorus-Vitamin D (CITRACAL +D3 PO), Take 2 tablets by mouth daily.   Clindamycin-Benzoyl Per, Refr, gel, Apply 1 g topically 2 (two) times daily.   desonide (DESOWEN) 0.05 % ointment, Apply twice daily to affected eyelids until clear, and then discontinue   Misc Natural Products (AIRBORNE ELDERBERRY) CHEW, Chew 2 each by mouth daily. Nature's Bounty Elderberry Gummies   Misc Natural Products (LUTEIN 20 PO), Take 1 each by mouth daily.   Multiple Vitamins-Minerals (CENTRUM ADULTS PO), Take by mouth.   OVER THE COUNTER MEDICATION, Take 2 each by mouth 2 (two) times daily. Bio Complete 3   tretinoin (RETIN-A) 0.025 % cream, Apply a pea size amount to dry face nightly   Reviewed prior external information including notes and imaging from  primary care provider As well as notes that were available from care everywhere and other healthcare systems.  Past medical history, social, surgical and family history all reviewed in electronic medical record.  No pertanent information unless stated regarding to the chief complaint.   Review of Systems:  No headache, visual changes, nausea, vomiting, diarrhea, constipation, dizziness, abdominal pain, skin rash, fevers, chills, night sweats, weight loss, swollen lymph nodes, body aches, joint swelling, chest pain, shortness of breath, mood changes. POSITIVE muscle aches  Objective  Blood pressure (!) 142/90, pulse 77, height $RemoveBe'5\' 3"'ieARzNoux$  (1.6 m), weight 171 lb (77.6 kg), last menstrual period 07/21/2015, SpO2 96 %.   General: No apparent distress alert and oriented x3 mood and affect normal, dressed appropriately.  HEENT: Pupils equal, extraocular movements intact  Respiratory: Patient's speak in full sentences and does not appear short of breath  Cardiovascular: No lower extremity edema, non tender, no erythema  Gait normal with good balance and coordination.  MSK: Patient's right Achilles does not have as much significant swelling as  previously.  Minorly tender to palpation still in the area.  Patient does have good range of motion of the ankle noted.  Still does have some pes planus with overpronation of the hindfoot noted.   Limited muscular skeletal ultrasound was performed and interpreted by Hulan Saas, M  Limited ultrasound shows the patient does have significant decrease in the hypoechoic changes from previously.  Some improvement but does have some neovascularization within the tendon of the Achilles and does have some scar tissue formation.  We will continue with calcific bony spur noted of the calcaneal region. Impression: Interval healing noted.   Impression and Recommendations:     The above documentation has been reviewed and is accurate and complete Lyndal Pulley, DO

## 2021-06-05 ENCOUNTER — Ambulatory Visit: Payer: 59 | Admitting: Family Medicine

## 2021-06-05 ENCOUNTER — Encounter: Payer: Self-pay | Admitting: Family Medicine

## 2021-06-05 ENCOUNTER — Ambulatory Visit: Payer: Self-pay

## 2021-06-05 ENCOUNTER — Other Ambulatory Visit: Payer: Self-pay

## 2021-06-05 VITALS — BP 142/90 | HR 77 | Ht 63.0 in | Wt 171.0 lb

## 2021-06-05 DIAGNOSIS — M222X2 Patellofemoral disorders, left knee: Secondary | ICD-10-CM | POA: Diagnosis not present

## 2021-06-05 DIAGNOSIS — M7661 Achilles tendinitis, right leg: Secondary | ICD-10-CM | POA: Diagnosis not present

## 2021-06-05 NOTE — Assessment & Plan Note (Signed)
Improvement has been made on ultrasound today.  Patient still has some calcific changes as well as some mild increase in neovascularization.  Discussed with patient to continue the brace, we discussed over-the-counter shoes that can be beneficial as well.  We discussed icing regimen and home exercises as well as with patient having difficulty with doing them on her own and will probably have a better efficiency we are going to physical therapy.

## 2021-06-05 NOTE — Patient Instructions (Addendum)
Keep doing everything else Kandy Garrison, [employer] [phone], [employer] See you again in 6 weeks

## 2021-06-09 ENCOUNTER — Ambulatory Visit: Payer: 59 | Admitting: Family Medicine

## 2021-06-11 ENCOUNTER — Ambulatory Visit: Payer: 59 | Attending: Family Medicine

## 2021-06-11 ENCOUNTER — Other Ambulatory Visit: Payer: Self-pay

## 2021-06-11 DIAGNOSIS — M25671 Stiffness of right ankle, not elsewhere classified: Secondary | ICD-10-CM | POA: Diagnosis not present

## 2021-06-11 DIAGNOSIS — M7661 Achilles tendinitis, right leg: Secondary | ICD-10-CM | POA: Diagnosis not present

## 2021-06-11 DIAGNOSIS — M222X2 Patellofemoral disorders, left knee: Secondary | ICD-10-CM | POA: Diagnosis not present

## 2021-06-11 DIAGNOSIS — G8929 Other chronic pain: Secondary | ICD-10-CM | POA: Diagnosis not present

## 2021-06-11 DIAGNOSIS — M25571 Pain in right ankle and joints of right foot: Secondary | ICD-10-CM | POA: Diagnosis not present

## 2021-06-12 NOTE — Therapy (Signed)
[place], [place], [address] Phone: [phone]   Fax:  [phone]  Physical Therapy Treatment  Patient Details  Name: Sydney [name] MRN: [id number] Date of Birth: 1966/01/03 Referring Provider (PT): Lyndal Pulley, DO   Encounter Date: 06/11/2021   PT End of Session - 06/12/21 0518     Visit Number 1    Number of Visits 17    Date for PT Re-Evaluation 08/16/21    Authorization Type [place] [employer]    Progress Note Due on Visit 10    PT Start Time 0720    PT Stop Time 0805    PT Time Calculation (min) 45 min    Activity Tolerance Patient tolerated treatment well    Behavior During Therapy [facility] for tasks assessed/performed             Past Medical History:  Diagnosis Date   Allergy    Anemia    Eczema    GERD (gastroesophageal reflux disease)    Hematuria    Hyperlipidemia    Hypertension    Kidney stone on left side    Lump in female breast    seen by Dr. Fleet Contras   Metabolic syndrome    Microscopic hematuria    Vitreous detachment of right eye 08/22/2019   Dr. George Ina,  [facility]    Past Surgical History:  Procedure Laterality Date   oral surgery  Left 10/2020   molar tooth implant    URETHRAL DILATION  Age 55    There were no vitals filed for this visit.   Subjective Assessment - 06/11/21 0733     Subjective Pt reports developing R posterior heel/ankle pain and swelling approx. 6 months without a known MOI. Pt notes that her Dr. Tamala Julian started her on ankle theraband and isometric exs and her pain is improving. Pt states L knee cap has been moving with certain knee movements for approx the last 3 months. Pt notes infrequent pain when this occurs.    Patient Stated Goals To return to dancing and to start playing tennis    Currently in Pain? No/denies    Pain Score 5    wehn the pain occurs   Pain Location Ankle    Pain Orientation Right;Posterior;Lateral     Pain Descriptors / Indicators Aching;Throbbing    Pain Type Chronic pain    Pain Onset More than a month ago    Pain Frequency Intermittent    Aggravating Factors  Prolonged standing and walking    Pain Relieving Factors RICE    Multiple Pain Sites Yes    Pain Score 1    Pain Location Knee    Pain Orientation Anterior;Left    Pain Descriptors / Indicators Aching    Pain Type Chronic pain    Pain Onset More than a month ago    Pain Frequency Occasional    Aggravating Factors  Certain L knee movements                [facility] PT Assessment - 06/12/21 0001       Assessment   Medical Diagnosis Achilles tendinitis of right lower extremity, Patellofemoral syndrome of left knee    Referring Provider (PT) Lyndal Pulley, DO    Onset Date/Surgical Date --   5-6 months issues with Achilles; Knee cap moving 3 months   Hand Dominance Right    Next MD Visit 07/22/21    Prior Therapy exs  provided by Dr. Tamala Julian      Precautions   Precautions None      Restrictions   Weight Bearing Restrictions No      Balance Screen   Has the patient fallen in the past 6 months No    Has the patient had a decrease in activity level because of a fear of falling?  No    Is the patient reluctant to leave their home because of a fear of falling?  No      Home Environment   Living Environment Private residence    Living Arrangements Alone    Type of Home Other(Comment)   [place] to enter    Entrance Stairs-Number of Steps 4    Entrance Stairs-Rails Right;Left    Home Layout One level      Prior Function   Level of Independence Independent    Vocation Full time employment    Estate agent      Cognition   Overall Cognitive Status Within Functional Limits for tasks assessed      Observation/Other Assessments   Focus on Therapeutic Outcomes (FOTO)  R ankle 50%; L knee 65%      Observation/Other Assessments-Edema    Edema Circumferential   Ankle mid heel: R  29.9, L 28.5     Sensation   Light Touch Appears Intact      ROM / Strength   AROM / PROM / Strength AROM;Strength      AROM   AROM Assessment Site Ankle    Right/Left Ankle Right;Left    Right Ankle Dorsiflexion 0    Right Ankle Plantar Flexion 50    Right Ankle Inversion 20    Right Ankle Eversion 15    Left Ankle Dorsiflexion 0    Left Ankle Plantar Flexion 50    Left Ankle Inversion 18    Left Ankle Eversion 16      Strength   Strength Assessment Site Ankle    Right/Left Ankle Right;Left    Right Ankle Dorsiflexion 5/5    Right Ankle Plantar Flexion 5/5    Right Ankle Inversion 5/5    Right Ankle Eversion 5/5    Left Ankle Dorsiflexion 5/5    Left Ankle Plantar Flexion 5/5    Left Ankle Inversion 5/5    Left Ankle Eversion 5/5      Palpation   Palpation comment TTP insertion of achilles to calcaneous      Transfers   Transfers Sit to Stand;Stand to Sit    Sit to Stand 7: Independent      Ambulation/Gait   Ambulation/Gait Yes    Ambulation/Gait Assistance 7: Independent    Gait Pattern Step-through pattern   out toeing, lateral heel strike to middle toe off                                   PT Education - 06/12/21 0517     Education Details Eval findings, POC, HEP- gastroc and soleus stretches were added to pt's HEP. RICE for symptom management.    Person(s) Educated Patient    Methods Explanation;Demonstration;Tactile cues;Verbal cues    Comprehension Verbalized understanding;Returned demonstration;Verbal cues required;Tactile cues required              PT Short Term Goals - 06/12/21 0550       PT SHORT TERM GOAL #1   Title Pt  will be Ind in an initial HEP    Baseline started on eval    Status New    Target Date 07/03/21      PT SHORT TERM GOAL #2   Title Pt will voice understanding of measures to assist in pain reduction    Status New    Target Date 07/03/21               PT Long Term Goals - 06/12/21 0551        PT LONG TERM GOAL #1   Title Improve pt' bilat ankle AROM for DF to 10d to reduce achilles strain    Baseline Bilat 0d    Status New    Target Date 08/16/21      PT LONG TERM GOAL #2   Title Pt will be able to line dance for 30 mins without development of R achilles pain    Status New    Target Date 08/16/21      PT LONG TERM GOAL #3   Title Pt FOTO score fo funtional ability will improve to 67% for the R achilles,and 74% for the L knee    Baseline R acilles 50%, L knee 65%    Status New    Target Date 08/16/21      PT LONG TERM GOAL #4   Title Pt will be Ind in a final HEP to maintain achieved LOF    Status New    Target Date 08/16/21                   Plan - 06/12/21 0520     Clinical Impression Statement Pt presents with signs and symptoms consistent c referring Dx of R achilles tendinititis. L knee PFPS will be assessed the next PT session. Per pt's reports the pain and tolerance to walking is improving, but prolonged standing and walking is the primary aggrevating factor. Today's assessment revealed good ankle strength, but decrease AROM of both achilles. Pt has been started on strengthening exs by Dr. Tamala Julian. Gastroc and soleus stretching exs were added to HEP. Pt will benefit from skilled PT for the reduction of pain, improved DF ROM to optimize functional mobility and to progress pt toward more physically demanding recreational pursuits.    Personal Factors and Comorbidities Past/Current Experience;Time since onset of injury/illness/exacerbation;Comorbidity 2    Comorbidities High BMI. HTN    Examination-Activity Limitations Squat;Stand;Locomotion Level    Stability/Clinical Decision Making Stable/Uncomplicated    Clinical Decision Making Low    Rehab Potential Good    PT Frequency 2x / week    PT Duration 8 weeks    PT Treatment/Interventions ADLs/Self Care Home Management;Aquatic Therapy;Electrical Stimulation;Cryotherapy;Iontophoresis 4mg /ml  Dexamethasone;Moist Heat;Ultrasound;Balance training;Therapeutic exercise;Therapeutic activities;Functional mobility training;Stair training;Gait training;Dry needling;Passive range of motion;Taping    PT Next Visit Plan Assess L PFPS. Assess repsonse to HEP. Assess bilat knee and hip strength. Progress ther ex for ROM and strengthening    PT Home Exercise Plan [phone]    Consulted and Agree with Plan of Care Patient             Patient will benefit from skilled therapeutic intervention in order to improve the following deficits and impairments:  Difficulty walking, Decreased activity tolerance, Impaired flexibility, Decreased range of motion, Pain  Visit Diagnosis: Achilles tendinitis of right lower extremity  Patellofemoral syndrome of left knee  Decreased range of motion of right ankle  Chronic pain of right ankle     Problem List Patient  Active Problem List   Diagnosis Date Noted   Achilles tendinitis of right lower extremity 05/07/2021   Patellofemoral syndrome of left knee 05/07/2021   Low back pain with sciatica 02/24/2015   Dyslipidemia 02/24/2015   Dermatitis, eczematoid 02/24/2015   Acid reflux 02/24/2015   Benign hypertension 02/24/2015   Iron deficiency 68/25/7493   Dysmetabolic syndrome 55/21/7471   Obesity (BMI 30.0-34.9) 02/24/2015   Allergic rhinitis 02/24/2015   Hematuria, microscopic 06/04/2010    Gar Ponto MS, PT 06/12/21 5:57 AM   [facility] [address] [place], [place], [address] Phone: [phone]   Fax:  [phone]  Name: Sydney [name] MRN: [id number] Date of Birth: 01-Nov-1965

## 2021-06-18 ENCOUNTER — Other Ambulatory Visit: Payer: Self-pay

## 2021-06-18 ENCOUNTER — Ambulatory Visit: Payer: 59

## 2021-06-18 DIAGNOSIS — G8929 Other chronic pain: Secondary | ICD-10-CM | POA: Diagnosis not present

## 2021-06-18 DIAGNOSIS — M222X2 Patellofemoral disorders, left knee: Secondary | ICD-10-CM

## 2021-06-18 DIAGNOSIS — M25571 Pain in right ankle and joints of right foot: Secondary | ICD-10-CM | POA: Diagnosis not present

## 2021-06-18 DIAGNOSIS — M7661 Achilles tendinitis, right leg: Secondary | ICD-10-CM | POA: Diagnosis not present

## 2021-06-18 DIAGNOSIS — M25671 Stiffness of right ankle, not elsewhere classified: Secondary | ICD-10-CM | POA: Diagnosis not present

## 2021-06-18 NOTE — Therapy (Signed)
[place] [address], [place], [address] Phone: [phone]   Fax:  [phone]  Physical Therapy Treatment  Patient Details  Name: Sydney [name] MRN: [id number] Date of Birth: 10-Sep-1965 Referring Provider (PT): Lyndal Pulley, DO   Encounter Date: 06/18/2021   PT End of Session - 06/18/21 1317     Visit Number 2    Number of Visits 17    Date for PT Re-Evaluation 08/16/21    Authorization Type [place] [employer]    Progress Note Due on Visit 10    PT Start Time 1152    PT Stop Time 1238    PT Time Calculation (min) 46 min    Activity Tolerance Patient tolerated treatment well    Behavior During Therapy [facility] for tasks assessed/performed             Past Medical History:  Diagnosis Date   Allergy    Anemia    Eczema    GERD (gastroesophageal reflux disease)    Hematuria    Hyperlipidemia    Hypertension    Kidney stone on left side    Lump in female breast    seen by Dr. Fleet Contras   Metabolic syndrome    Microscopic hematuria    Vitreous detachment of right eye 08/22/2019   Dr. George Ina,  [facility]    Past Surgical History:  Procedure Laterality Date   oral surgery  Left 10/2020   molar tooth implant    URETHRAL DILATION  Age 55    There were no vitals filed for this visit.   Subjective Assessment - 06/18/21 1204     Subjective Pt reports she experienced a flare up after being very activity and wearing a piar of shoes which were not supportive    Patient Stated Goals To return to dancing and to start playing tennis    Pain Score 3     Pain Location Ankle    Pain Orientation Right;Posterior;Lateral    Pain Descriptors / Indicators Aching;Throbbing    Pain Type Chronic pain    Pain Onset More than a month ago    Pain Frequency Intermittent    Aggravating Factors  Prolonged standing and walking    Pain Relieving Factors RICE    Pain Score 0    Pain Location Knee    Pain  Orientation Anterior    Pain Descriptors / Indicators Aching    Pain Type Chronic pain    Pain Onset More than a month ago    Pain Frequency Occasional    Aggravating Factors  Certain L knee movements                               [facility] Adult PT Treatment/Exercise - 06/18/21 0001       Exercises   Exercises Ankle;Knee/Hip      Knee/Hip Exercises: Supine   Bridges Both    Straight Leg Raises Right;Left;10 reps    Straight Leg Raises Limitations quad set prior to SLR      Knee/Hip Exercises: Sidelying   Hip ABduction Right;Left;10 reps    Clams Rt and Lt, 10 reps      Knee/Hip Exercises: Prone   Straight Leg Raises Right;Both;10 reps      Manual Therapy   Manual Therapy Soft tissue mobilization    Soft tissue mobilization STM to the R calf and cross friction massage to  the lateral aspect of the Rt Achiiles tendon      Ankle Exercises: Stretches   Soleus Stretch 2 reps    Gastroc Stretch 2 reps;30 seconds      Ankle Exercises: Standing   Heel Raises Right;Left;10 reps    Heel Raises Limitations Up both, eccentric R 10x                     PT Education - 06/18/21 1316     Education Details HEP for progressed ankle strengthening and for hip/knee strengthening. Cross friction massage to the achilles to disrupt the imflamatory cycle. Rolling calves to decrease musclular tesion both calfs.    Person(s) Educated Patient    Methods Explanation;Demonstration;Tactile cues;Verbal cues    Comprehension Verbalized understanding;Returned demonstration;Verbal cues required;Tactile cues required              PT Short Term Goals - 06/12/21 0550       PT SHORT TERM GOAL #1   Title Pt will be Ind in an initial HEP    Baseline started on eval    Status New    Target Date 07/03/21      PT SHORT TERM GOAL #2   Title Pt will voice understanding of measures to assist in pain reduction    Status New    Target Date 07/03/21                PT Long Term Goals - 06/18/21 1340       PT LONG TERM GOAL #5   Title Improve Bilat hip strength to 4+/5 for support of her ankles    Baseline 4/5    Status New    Target Date 08/16/21                   Plan - 06/18/21 1318     Clinical Impression Statement PT was provided for assessment of bilat hip and knee strength assessment with strengthening exs completed and added to HEP. Min waekness was found c both hips. pt was instructed in rolling her calfs to decrease muscle tension and cross friction massage to the Achilles tendon. Pt is already using a foam roller for her calves and returned demonstration for cross friction massage to her Achilles. Additionally, reviewed and pt completed achilles/DF stretching exs and CKC strengthening of the ankles. Pt tolerated today's session without adverse effects. Pt will continue to benefitfrom PT to address ROM and strength deficits to decrease pain and improve function of the R ankle.    Personal Factors and Comorbidities Past/Current Experience;Time since onset of injury/illness/exacerbation;Comorbidity 2    Comorbidities High BMI. HTN    Examination-Activity Limitations Squat;Stand;Locomotion Level    Stability/Clinical Decision Making Stable/Uncomplicated    Clinical Decision Making Low    Rehab Potential Good    PT Frequency 2x / week    PT Duration 8 weeks    PT Treatment/Interventions ADLs/Self Care Home Management;Aquatic Therapy;Electrical Stimulation;Cryotherapy;Iontophoresis 4mg /ml Dexamethasone;Moist Heat;Ultrasound;Balance training;Therapeutic exercise;Therapeutic activities;Functional mobility training;Stair training;Gait training;Dry needling;Passive range of motion;Taping    PT Next Visit Plan Assess L PFPS. Assess repsonse to HEP Progress ther ex for ROM and strengthening    PT Home Exercise Plan [phone]    Consulted and Agree with Plan of Care Patient             Patient will benefit from skilled therapeutic  intervention in order to improve the following deficits and impairments:  Difficulty walking, Decreased activity tolerance, Impaired flexibility, Decreased  range of motion, Pain  Visit Diagnosis: Achilles tendinitis of right lower extremity  Patellofemoral syndrome of left knee  Decreased range of motion of right ankle  Chronic pain of right ankle     Problem List Patient Active Problem List   Diagnosis Date Noted   Achilles tendinitis of right lower extremity 05/07/2021   Patellofemoral syndrome of left knee 05/07/2021   Low back pain with sciatica 02/24/2015   Dyslipidemia 02/24/2015   Dermatitis, eczematoid 02/24/2015   Acid reflux 02/24/2015   Benign hypertension 02/24/2015   Iron deficiency 72/55/0016   Dysmetabolic syndrome 42/90/3795   Obesity (BMI 30.0-34.9) 02/24/2015   Allergic rhinitis 02/24/2015   Hematuria, microscopic 06/04/2010   Gar Ponto MS, PT 06/18/21 1:43 PM  [facility] [address] [place], [place], [address] Phone: [phone]   Fax:  [phone]  Name: CHANELLE HODSDON MRN: [id number] Date of Birth: [birth year]

## 2021-06-20 ENCOUNTER — Ambulatory Visit: Payer: 59

## 2021-06-20 ENCOUNTER — Other Ambulatory Visit: Payer: Self-pay

## 2021-06-20 DIAGNOSIS — G8929 Other chronic pain: Secondary | ICD-10-CM

## 2021-06-20 DIAGNOSIS — M222X2 Patellofemoral disorders, left knee: Secondary | ICD-10-CM

## 2021-06-20 DIAGNOSIS — M25671 Stiffness of right ankle, not elsewhere classified: Secondary | ICD-10-CM

## 2021-06-20 DIAGNOSIS — M7661 Achilles tendinitis, right leg: Secondary | ICD-10-CM

## 2021-06-20 DIAGNOSIS — M25571 Pain in right ankle and joints of right foot: Secondary | ICD-10-CM | POA: Diagnosis not present

## 2021-06-20 NOTE — Therapy (Signed)
[place], [place], [address] Phone: [phone]   Fax:  [phone]  Physical Therapy Treatment  Patient Details  Name: Sydney [name] MRN: [id number] Date of Birth: [birth year] Referring Provider (PT): Lyndal Pulley, DO   Encounter Date: 06/20/2021   PT End of Session - 06/20/21 0738     Visit Number 3    Number of Visits 17    Date for PT Re-Evaluation 08/16/21    Authorization Type [place] [employer]    Progress Note Due on Visit 10    PT Start Time 0720    PT Stop Time 0800    PT Time Calculation (min) 40 min    Activity Tolerance Patient tolerated treatment well    Behavior During Therapy [facility] for tasks assessed/performed             Past Medical History:  Diagnosis Date   Allergy    Anemia    Eczema    GERD (gastroesophageal reflux disease)    Hematuria    Hyperlipidemia    Hypertension    Kidney stone on left side    Lump in female breast    seen by Dr. Fleet Contras   Metabolic syndrome    Microscopic hematuria    Vitreous detachment of right eye 08/22/2019   Dr. George Ina,  [facility]    Past Surgical History:  Procedure Laterality Date   oral surgery  Left 10/2020   molar tooth implant    URETHRAL DILATION  Age 3    There were no vitals filed for this visit.   Subjective Assessment - 06/20/21 0728     Subjective Pt reports she did her HEP yesterday. Currently, she is not having pain.    Patient Stated Goals To return to dancing and to start playing tennis    Pain Score 0-No pain    Pain Orientation Right;Posterior    Pain Descriptors / Indicators Aching;Throbbing    Pain Type Chronic pain    Pain Onset More than a month ago    Pain Frequency Intermittent    Pain Score 0    Pain Location Knee    Pain Orientation Anterior    Pain Descriptors / Indicators Aching    Pain Type Chronic pain    Pain Onset More than a month ago    Pain Frequency Occasional                                [facility] Adult PT Treatment/Exercise - 06/20/21 0001       Exercises   Exercises Ankle;Knee/Hip      Ankle Exercises: Stretches   Slant Board Stretch 2 reps;30 seconds   for both gastroc and soleus     Ankle Exercises: Aerobic   Nustep L1, R1, 5 mins, 2.5      Ankle Exercises: Seated   Other Seated Ankle Exercises 4 way ankle c red Tband, 2x10      Ankle Exercises: Standing   Rocker Board 2 minutes   forward/backward; laterally   Heel Raises Right;Left;15 reps    Heel Raises Limitations Up both, eccentric R 15x    Other Standing Ankle Exercises banded (red) side steps and monster steps, 4x15'                       PT Short Term Goals - 06/12/21 2094  PT SHORT TERM GOAL #1   Title Pt will be Ind in an initial HEP    Baseline started on eval    Status New    Target Date 07/03/21      PT SHORT TERM GOAL #2   Title Pt will voice understanding of measures to assist in pain reduction    Status New    Target Date 07/03/21               PT Long Term Goals - 06/18/21 1340       PT LONG TERM GOAL #5   Title Improve Bilat hip strength to 4+/5 for support of her ankles    Baseline 4/5    Status New    Target Date 08/16/21                   Plan - 06/20/21 1052     Clinical Impression Statement PT continued to be focus on R achilles/ankle flexibility/ROM and strengthening/stability, as well as strengthening for the hips. Additionally, balance activities were completed to address strength/stability. Pt tolerated today's PT session wiothout adverse effects. Pt is having intermittent pain, but her pain level is low. Pt voices understanding re: managing her activity level, to be active but not to overdo it. Pt will continue to benefit from skilled PT to address strength and ROM to improve pain and functional ability and tolerance.    Personal Factors and Comorbidities Past/Current Experience;Time  since onset of injury/illness/exacerbation;Comorbidity 2    Comorbidities High BMI. HTN    Examination-Activity Limitations Squat;Stand;Locomotion Level    Stability/Clinical Decision Making Stable/Uncomplicated    Clinical Decision Making Low    Rehab Potential Good    PT Frequency 2x / week    PT Duration 8 weeks    PT Treatment/Interventions ADLs/Self Care Home Management;Aquatic Therapy;Electrical Stimulation;Cryotherapy;Iontophoresis 4mg /ml Dexamethasone;Moist Heat;Ultrasound;Balance training;Therapeutic exercise;Therapeutic activities;Functional mobility training;Stair training;Gait training;Dry needling;Passive range of motion;Taping    PT Next Visit Plan Assess repsonse to HEP Progress ther ex for ROM and strengthening    PT Home Exercise Plan [id number]    Consulted and Agree with Plan of Care Patient             Patient will benefit from skilled therapeutic intervention in order to improve the following deficits and impairments:  Difficulty walking, Decreased activity tolerance, Impaired flexibility, Decreased range of motion, Pain  Visit Diagnosis: Achilles tendinitis of right lower extremity  Patellofemoral syndrome of left knee  Decreased range of motion of right ankle  Chronic pain of right ankle     Problem List Patient Active Problem List   Diagnosis Date Noted   Achilles tendinitis of right lower extremity 05/07/2021   Patellofemoral syndrome of left knee 05/07/2021   Low back pain with sciatica 02/24/2015   Dyslipidemia 02/24/2015   Dermatitis, eczematoid 02/24/2015   Acid reflux 02/24/2015   Benign hypertension 02/24/2015   Iron deficiency 98/92/1194   Dysmetabolic syndrome 17/40/8144   Obesity (BMI 30.0-34.9) 02/24/2015   Allergic rhinitis 02/24/2015   Hematuria, microscopic 06/04/2010   Gar Ponto MS, PT 06/20/21 1:20 PM   [facility] [address] [place], [place], [address] Phone:  [phone]   Fax:  [phone]  Name: Sydney [name] MRN: [id number] Date of Birth: 1966-07-20

## 2021-06-24 ENCOUNTER — Other Ambulatory Visit: Payer: Self-pay

## 2021-06-24 ENCOUNTER — Ambulatory Visit: Payer: 59

## 2021-06-24 DIAGNOSIS — M25671 Stiffness of right ankle, not elsewhere classified: Secondary | ICD-10-CM | POA: Diagnosis not present

## 2021-06-24 DIAGNOSIS — M7661 Achilles tendinitis, right leg: Secondary | ICD-10-CM

## 2021-06-24 DIAGNOSIS — M222X2 Patellofemoral disorders, left knee: Secondary | ICD-10-CM | POA: Diagnosis not present

## 2021-06-24 DIAGNOSIS — M25571 Pain in right ankle and joints of right foot: Secondary | ICD-10-CM

## 2021-06-24 DIAGNOSIS — G8929 Other chronic pain: Secondary | ICD-10-CM

## 2021-06-24 NOTE — Therapy (Signed)
[place], [place], [address] Phone: [phone]   Fax:  [phone]  Physical Therapy Treatment  Patient Details  Name: Sydney [name] MRN: [id number] Date of Birth: 1966-03-30 Referring Provider (PT): Lyndal Pulley, DO   Encounter Date: 06/24/2021   PT End of Session - 06/24/21 2105     Visit Number 4    Number of Visits 17    Date for PT Re-Evaluation 08/16/21    Authorization Type [employer]    Progress Note Due on Visit 10    PT Start Time 0807    PT Stop Time 0850    PT Time Calculation (min) 43 min    Activity Tolerance Patient tolerated treatment well    Behavior During Therapy [facility] for tasks assessed/performed             Past Medical History:  Diagnosis Date   Allergy    Anemia    Eczema    GERD (gastroesophageal reflux disease)    Hematuria    Hyperlipidemia    Hypertension    Kidney stone on left side    Lump in female breast    seen by Dr. Fleet Contras   Metabolic syndrome    Microscopic hematuria    Vitreous detachment of right eye 08/22/2019   Dr. George Ina,  [facility]    Past Surgical History:  Procedure Laterality Date   oral surgery  Left 10/2020   molar tooth implant    URETHRAL DILATION  Age 10    There were no vitals filed for this visit.   Subjective Assessment - 06/24/21 0811     Subjective Pt reports the therapy and the HEP are going well. She reports medial R ankle discomfort today which hs thinks is related last night from grocery shopping. Pt reports she is completing her exs consistently    Patient Stated Goals To return to dancing and to start playing tennis    Currently in Pain? Yes    Pain Score 3     Pain Location Ankle    Pain Orientation Right;Posterior    Pain Descriptors / Indicators Aching    Pain Type Chronic pain    Pain Onset More than a month ago    Pain Frequency Intermittent    Pain Score 0    Pain Location Knee     Pain Orientation Anterior    Pain Descriptors / Indicators Aching    Pain Type Chronic pain    Pain Onset More than a month ago    Pain Frequency Occasional              [facility] Adult PT Treatment/Exercise:  Therapeutic Exercise: - Eliptical 5 min, L1, R1, 2. - Slant board: gastroc stretch, soleus stretch, 2x 60 sec for each stretch - seated LAQs, 15x L and R, 3 lbs - Standing hip abd at free motion, 15x L and R, 3 lbs - Sit to/from standing 10x2, medial knee collapse, c verbal cueing pt was able to correct the medial collapse  Manual Therapy: - STM to the medial and lateral gastroc, and the fibularis longus and brevis f/b trigger point massage.  Neuromuscular re-ed: -   Therapeutic Activity: -   Self-care/Home Management: - Discussed TPDN, the purpose, risks and contraindications. Pt is to going to consider TPDN as a treament option.  PT Short Term Goals - 06/12/21 0550       PT SHORT TERM GOAL #1   Title Pt will be Ind in an initial HEP    Baseline started on eval    Status New    Target Date 07/03/21      PT SHORT TERM GOAL #2   Title Pt will voice understanding of measures to assist in pain reduction    Status New    Target Date 07/03/21               PT Long Term Goals - 06/18/21 1340       PT LONG TERM GOAL #5   Title Improve Bilat hip strength to 4+/5 for support of her ankles    Baseline 4/5    Status New    Target Date 08/16/21                   Plan - 06/24/21 2122     Clinical Impression Statement Pt is tolerated ther exs for R achilles/foot flexibility and strength. The amount of time on her feet with her ADLs is intermittently aggrevating different aspects of the R achilles, foot, and ankle. The discomfort level remains at a low level with these intermittent occurances. Pt tolerated today's session without adverse effects.    Personal Factors and Comorbidities  Past/Current Experience;Time since onset of injury/illness/exacerbation;Comorbidity 2    Comorbidities High BMI. HTN    Examination-Activity Limitations Squat;Stand;Locomotion Level    Stability/Clinical Decision Making Stable/Uncomplicated    Clinical Decision Making Low    Rehab Potential Good    PT Frequency 2x / week    PT Duration 8 weeks    PT Treatment/Interventions ADLs/Self Care Home Management;Aquatic Therapy;Electrical Stimulation;Cryotherapy;Iontophoresis 4mg /ml Dexamethasone;Moist Heat;Ultrasound;Balance training;Therapeutic exercise;Therapeutic activities;Functional mobility training;Stair training;Gait training;Dry needling;Passive range of motion;Taping    PT Next Visit Plan Assess repsonse to HEP Progress ther ex for ROM and strengthening. Possible TPDN the next visit as indicated. Re-assess goals.    PT Home Exercise Plan [phone]    Consulted and Agree with Plan of Care Patient             Patient will benefit from skilled therapeutic intervention in order to improve the following deficits and impairments:  Difficulty walking, Decreased activity tolerance, Impaired flexibility, Decreased range of motion, Pain  Visit Diagnosis: Achilles tendinitis of right lower extremity  Patellofemoral syndrome of left knee  Decreased range of motion of right ankle  Chronic pain of right ankle     Problem List Patient Active Problem List   Diagnosis Date Noted   Achilles tendinitis of right lower extremity 05/07/2021   Patellofemoral syndrome of left knee 05/07/2021   Low back pain with sciatica 02/24/2015   Dyslipidemia 02/24/2015   Dermatitis, eczematoid 02/24/2015   Acid reflux 02/24/2015   Benign hypertension 02/24/2015   Iron deficiency 67/89/3810   Dysmetabolic syndrome 17/51/0258   Obesity (BMI 30.0-34.9) 02/24/2015   Allergic rhinitis 02/24/2015   Hematuria, microscopic 06/04/2010    Gar Ponto MS, PT 06/24/21 9:36 PM   [facility] [address] [place], [place], [address] Phone: [phone]   Fax:  [phone]  Name: Sydney [name] MRN: [id number] Date of Birth: [birth year]

## 2021-06-26 ENCOUNTER — Ambulatory Visit: Payer: 59 | Admitting: Physical Therapy

## 2021-06-26 ENCOUNTER — Other Ambulatory Visit: Payer: Self-pay

## 2021-06-26 ENCOUNTER — Encounter: Payer: Self-pay | Admitting: Physical Therapy

## 2021-06-26 DIAGNOSIS — M7661 Achilles tendinitis, right leg: Secondary | ICD-10-CM | POA: Diagnosis not present

## 2021-06-26 DIAGNOSIS — M222X2 Patellofemoral disorders, left knee: Secondary | ICD-10-CM | POA: Diagnosis not present

## 2021-06-26 DIAGNOSIS — G8929 Other chronic pain: Secondary | ICD-10-CM

## 2021-06-26 DIAGNOSIS — M25671 Stiffness of right ankle, not elsewhere classified: Secondary | ICD-10-CM

## 2021-06-26 DIAGNOSIS — M25571 Pain in right ankle and joints of right foot: Secondary | ICD-10-CM | POA: Diagnosis not present

## 2021-06-26 NOTE — Therapy (Signed)
Sydney [name], [place], [address] Phone: [phone]   Fax:  [phone]  Physical Therapy Treatment  Patient Details  Name: Sydney [name] MRN: [id number] Date of Birth: October 24, 1965 Referring Provider (PT): Lyndal Pulley, DO   Encounter Date: 06/26/2021   PT End of Session - 06/26/21 0724     Visit Number 5    Number of Visits 17    Date for PT Re-Evaluation 08/16/21    Authorization Type [employer]    Progress Note Due on Visit 10    PT Start Time 0719    PT Stop Time 0759    PT Time Calculation (min) 40 min             Past Medical History:  Diagnosis Date   Allergy    Anemia    Eczema    GERD (gastroesophageal reflux disease)    Hematuria    Hyperlipidemia    Hypertension    Kidney stone on left side    Lump in female breast    seen by Dr. Fleet Contras   Metabolic syndrome    Microscopic hematuria    Vitreous detachment of right eye 08/22/2019   Dr. George [name],  [facility]    Past Surgical History:  Procedure Laterality Date   oral surgery  Left 10/2020   molar tooth implant    URETHRAL DILATION  Age 39    There were no vitals filed for this visit.   Subjective Assessment - 06/26/21 0721     Subjective I twisted on my ankle yesterday when I was in a hurry and it was bothering me last night. I slept with the brace on. I wore my air cast this moring while I was getting ready, and now I have my ASO on.    Currently in Pain? Yes    Pain Score 3     Pain Location Ankle    Pain Orientation Right;Medial;Lateral    Pain Descriptors / Indicators Constant    Pain Type Chronic pain    Aggravating Factors  standing and walking    Pain Relieving Factors RICE    Pain Score 0    Pain Location Knee    Pain Orientation Left                 [facility] Adult PT Treatment/Exercise - 06/26/21 0001       Knee/Hip Exercises: Aerobic   Recumbent Bike L2 x 5 minutes       Knee/Hip Exercises: Standing   Forward Step Up Left;15 reps;Step Height: 6";Hand Hold: 1    Forward Step Up Limitations cues for LE joint alignment      Knee/Hip Exercises: Supine   Quad Sets 10 reps    Quad Sets Limitations with ball squeeze    Short Arc Quad Sets 10 reps   alternating   Short Arc Quad Sets Limitations with ball squeeze    Straight Leg Raise with External Rotation 10 reps;Left;Right;2 sets      Ankle Exercises: Standing   SLS blue foam oval: >60 sec    Rocker Board 2 minutes   blue board , A/P and laterals   Heel Raises Right;Left;15 reps      Ankle Exercises: Stretches   Slant Board Stretch 2 reps;30 seconds   for both gastroc and soleus     Ankle Exercises: Seated   Other Seated Ankle Exercises 4 way ankle c red Tband, 2x10  PT Short Term Goals - 06/26/21 0726       PT SHORT TERM GOAL #1   Title Pt will be Ind in an initial HEP    Baseline started on eval; 06/26/21: independent with HEP    Period Weeks    Status Achieved    Target Date 07/03/21      PT SHORT TERM GOAL #2   Title Pt will voice understanding of measures to assist in pain reduction    Baseline 06/26/21: pt verbalizes ice, compression and elevation when she exacerbates her pain.    Period Weeks    Status Achieved    Target Date 07/03/21               PT Long Term Goals - 06/18/21 1340       PT LONG TERM GOAL #5   Title Improve Bilat hip strength to 4+/5 for support of her ankles    Baseline 4/5    Status New    Target Date 08/16/21                   Plan - 06/26/21 0808     Clinical Impression Statement Pt reports ankle exacerbation yesterday with a quick twisting motion. She was encouraged to avoid painful exercises when her pain is elevated. She can verbalize pain reduction strategies and is independent with HEP thus far. STGS are met. She reports her left knee is about the same. Progressed with step ups with education on LE  joint alignement to reduce knee strain. 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 Pt reported feeling like she got a good work out at end of session and min pain in back of ankle.    PT Treatment/Interventions ADLs/Self Care Home Management;Aquatic Therapy;Electrical Stimulation;Cryotherapy;Iontophoresis 4mg /ml Dexamethasone;Moist Heat;Ultrasound;Balance training;Therapeutic exercise;Therapeutic activities;Functional mobility training;Stair training;Gait training;Dry needling;Passive range of motion;Taping    PT Next Visit Plan Assess repsonse to HEP Progress ther ex for ROM and strengthening. Possible TPDN the next visit as indicated. Re-assess goals.    PT Home Exercise Plan [phone]             Patient will benefit from skilled therapeutic intervention in order to improve the following deficits and impairments:  Difficulty walking, Decreased activity tolerance, Impaired flexibility, Decreased range of motion, Pain  Visit Diagnosis: Achilles tendinitis of right lower extremity  Patellofemoral syndrome of left knee  Decreased range of motion of right ankle  Chronic pain of right ankle     Problem List Patient Active Problem List   Diagnosis Date Noted   Achilles tendinitis of right lower extremity 05/07/2021   Patellofemoral syndrome of left knee 05/07/2021   Low back pain with sciatica 02/24/2015   Dyslipidemia 02/24/2015   Dermatitis, eczematoid 02/24/2015   Acid reflux 02/24/2015   Benign hypertension 02/24/2015   Iron deficiency 51/76/1607   Dysmetabolic syndrome 37/05/6268   Obesity (BMI 30.0-34.9) 02/24/2015   Allergic rhinitis 02/24/2015   Hematuria, microscopic 06/04/2010    Dorene [name], PTA 06/26/2021, 8:21 AM  [facility] [address] [place], [place], [address] Phone: [phone]   Fax:  [phone]  Name: Sydney [name] MRN: [id number] Date of Birth: 04/26/1966

## 2021-07-01 ENCOUNTER — Ambulatory Visit: Payer: 59

## 2021-07-03 ENCOUNTER — Other Ambulatory Visit: Payer: Self-pay

## 2021-07-03 ENCOUNTER — Encounter: Payer: Self-pay | Admitting: Physical Therapy

## 2021-07-03 ENCOUNTER — Ambulatory Visit: Payer: 59 | Attending: Family Medicine | Admitting: Physical Therapy

## 2021-07-03 DIAGNOSIS — M25571 Pain in right ankle and joints of right foot: Secondary | ICD-10-CM | POA: Diagnosis not present

## 2021-07-03 DIAGNOSIS — M25671 Stiffness of right ankle, not elsewhere classified: Secondary | ICD-10-CM | POA: Diagnosis not present

## 2021-07-03 DIAGNOSIS — M7661 Achilles tendinitis, right leg: Secondary | ICD-10-CM | POA: Diagnosis not present

## 2021-07-03 DIAGNOSIS — G8929 Other chronic pain: Secondary | ICD-10-CM

## 2021-07-03 DIAGNOSIS — M222X2 Patellofemoral disorders, left knee: Secondary | ICD-10-CM | POA: Diagnosis not present

## 2021-07-03 NOTE — Therapy (Signed)
[place], [place], [address] Phone: [phone]   Fax:  [phone]  Physical Therapy Treatment  Patient Details  Name: Sydney [name] MRN: [id number] Date of Birth: [birth year] Referring Provider (PT): Lyndal Pulley, DO   Encounter Date: 07/03/2021   PT End of Session - 07/03/21 0722     Visit Number 6    Number of Visits 17    Date for PT Re-Evaluation 08/16/21    Authorization Type [employer] [employer]    Progress Note Due on Visit 10    PT Start Time 0715    PT Stop Time 0755    PT Time Calculation (min) 40 min             Past Medical History:  Diagnosis Date   Allergy    Anemia    Eczema    GERD (gastroesophageal reflux disease)    Hematuria    Hyperlipidemia    Hypertension    Kidney stone on left side    Lump in female breast    seen by Dr. Fleet Contras   Metabolic syndrome    Microscopic hematuria    Vitreous detachment of right eye 08/22/2019   Dr. George Ina,  [facility]    Past Surgical History:  Procedure Laterality Date   oral surgery  Left 10/2020   molar tooth implant    URETHRAL DILATION  Age 28    There were no vitals filed for this visit.   Subjective Assessment - 07/03/21 0719     Subjective I have been doing the exercises every other day. I feel like I am improving.    Pain Score 0-No pain    Pain Location Ankle    Pain Orientation Right    Aggravating Factors  standing and walking    Pain Score 0    Pain Location Knee    Pain Orientation Left    Aggravating Factors  prolonged standing    Pain Relieving Factors rest                               [facility] Adult PT Treatment/Exercise - 07/03/21 0001       Knee/Hip Exercises: Aerobic   Recumbent Bike L2 x 5 minutes      Knee/Hip Exercises: Standing   Forward Step Up Left;Step Height: 6";Hand Hold: 1;10 reps;2 sets    SLS with ABCs holding black physioball    Rebounder SLS  Right and Left with red ball toss 10 x 2 each      Knee/Hip Exercises: Supine   Straight Leg Raises 10 reps;2 sets    Straight Leg Raises Limitations 2#    Straight Leg Raise with External Rotation 10 reps;Left;Right;1 set    Straight Leg Raise with External Rotation Limitations 2#      Knee/Hip Exercises: Sidelying   Hip ABduction Right;Left;10 reps    Hip ABduction Limitations 2#      Ankle Exercises: Stretches   Soleus Stretch 2 reps    Gastroc Stretch 2 reps;30 seconds    Slant Board Stretch 2 reps;30 seconds   for both gastroc and soleus     Ankle Exercises: Standing   Heel Raises 10 reps   single leg   Heel Raises Limitations Up both, eccentric R 15x  PT Short Term Goals - 06/26/21 0726       PT SHORT TERM GOAL #1   Title Pt will be Ind in an initial HEP    Baseline started on eval; 06/26/21: independent with HEP    Period Weeks    Status Achieved    Target Date 07/03/21      PT SHORT TERM GOAL #2   Title Pt will voice understanding of measures to assist in pain reduction    Baseline 06/26/21: pt verbalizes ice, compression and elevation when she exacerbates her pain.    Period Weeks    Status Achieved    Target Date 07/03/21               PT Long Term Goals - 07/03/21 1013       PT LONG TERM GOAL #1   Title Improve pt' bilat ankle AROM for DF to 10d to reduce achilles strain    Baseline Bilat 0d    Period Weeks    Status On-going      PT LONG TERM GOAL #2   Title Pt will be able to line dance for 30 mins without development of R achilles pain    Period Weeks    Status On-going      PT LONG TERM GOAL #3   Baseline R acilles 50%, L knee 65%    Period Weeks    Status On-going      PT LONG TERM GOAL #4   Title Pt will be Ind in a final HEP to maintain achieved LOF    Period Weeks    Status On-going      PT LONG TERM GOAL #5   Title Improve Bilat hip strength to 4+/5 for support of her ankles    Period Weeks     Status On-going                   Plan - 07/03/21 1010     Clinical Impression Statement Pt reports ankle and knee pain with prolonged activity. She reports overall improvement and does not have as much pain in the mornings following a long day on her feet. Continued with ankle stability and quad / hip strengthening. Increased closed chain challenge with wall sits. Updated HEP with closed chain exercise. She reported intermittent feeling of pulling in her left heel that resoled after calf stretching. Otherwise session was tolerated well without increased pain.    PT Treatment/Interventions ADLs/Self Care Home Management;Aquatic Therapy;Electrical Stimulation;Cryotherapy;Iontophoresis 4mg /ml Dexamethasone;Moist Heat;Ultrasound;Balance training;Therapeutic exercise;Therapeutic activities;Functional mobility training;Stair training;Gait training;Dry needling;Passive range of motion;Taping    PT Next Visit Plan Assess repsonse to HEP Progress ther ex for ROM and strengthening. Possible TPDN the next visit as indicated. Re-assess goals.    PT Home Exercise Plan [phone]             Patient will benefit from skilled therapeutic intervention in order to improve the following deficits and impairments:  Difficulty walking, Decreased activity tolerance, Impaired flexibility, Decreased range of motion, Pain  Visit Diagnosis: Achilles tendinitis of right lower extremity  Patellofemoral syndrome of left knee  Decreased range of motion of right ankle  Chronic pain of right ankle     Problem List Patient Active Problem List   Diagnosis Date Noted   Achilles tendinitis of right lower extremity 05/07/2021   Patellofemoral syndrome of left knee 05/07/2021   Low back pain with sciatica 02/24/2015   Dyslipidemia 02/24/2015   Dermatitis, eczematoid 02/24/2015   Acid  reflux 02/24/2015   Benign hypertension 02/24/2015   Iron deficiency 81/44/8185   Dysmetabolic syndrome 63/14/9702    Obesity (BMI 30.0-34.9) 02/24/2015   Allergic rhinitis 02/24/2015   Hematuria, microscopic 06/04/2010    Dorene Ar, PTA 07/03/2021, 10:14 AM  [facility] [address] [place], [place], [address] Phone: [phone]   Fax:  [phone]  Name: Sydney [name] MRN: [id number] Date of Birth: 1966/08/03

## 2021-07-07 ENCOUNTER — Other Ambulatory Visit: Payer: Self-pay | Admitting: Family Medicine

## 2021-07-07 ENCOUNTER — Other Ambulatory Visit: Payer: Self-pay

## 2021-07-07 DIAGNOSIS — I10 Essential (primary) hypertension: Secondary | ICD-10-CM

## 2021-07-08 ENCOUNTER — Other Ambulatory Visit: Payer: Self-pay

## 2021-07-08 ENCOUNTER — Ambulatory Visit: Payer: 59

## 2021-07-08 DIAGNOSIS — M25671 Stiffness of right ankle, not elsewhere classified: Secondary | ICD-10-CM

## 2021-07-08 DIAGNOSIS — M25571 Pain in right ankle and joints of right foot: Secondary | ICD-10-CM

## 2021-07-08 DIAGNOSIS — M222X2 Patellofemoral disorders, left knee: Secondary | ICD-10-CM | POA: Diagnosis not present

## 2021-07-08 DIAGNOSIS — M7661 Achilles tendinitis, right leg: Secondary | ICD-10-CM | POA: Diagnosis not present

## 2021-07-08 DIAGNOSIS — G8929 Other chronic pain: Secondary | ICD-10-CM

## 2021-07-08 MED FILL — Hydrochlorothiazide Cap 12.5 MG: ORAL | 90 days supply | Qty: 90 | Fill #0 | Status: AC

## 2021-07-08 MED FILL — Metoprolol Succinate Tab ER 24HR 25 MG (Tartrate Equiv): ORAL | 90 days supply | Qty: 45 | Fill #0 | Status: AC

## 2021-07-08 NOTE — Telephone Encounter (Signed)
Seen in 11/2020 for HTN, asked to return in one year.  Requested Prescriptions  Pending Prescriptions Disp Refills  . hydrochlorothiazide (MICROZIDE) 12.5 MG capsule 90 capsule 1    Sig: TAKE 1 CAPSULE BY MOUTH DAILY.     Cardiovascular: Diuretics - Thiazide Failed - 07/07/2021  5:28 PM      Failed - Ca in normal range and within 360 days    Calcium  Date Value Ref Range Status  11/22/2019 9.1 8.6 - 10.4 mg/dL Final         Failed - Cr in normal range and within 360 days    Creat  Date Value Ref Range Status  11/22/2019 0.62 0.50 - 1.05 mg/dL Final    Comment:    For patients >32 years of age, the reference limit for Creatinine is approximately 13% higher for people identified as African-American. .          Failed - K in normal range and within 360 days    Potassium  Date Value Ref Range Status  11/22/2019 3.6 3.5 - 5.3 mmol/L Final         Failed - Na in normal range and within 360 days    Sodium  Date Value Ref Range Status  11/22/2019 140 135 - 146 mmol/L Final  03/28/2015 140 134 - 144 mmol/L Final         Failed - Last BP in normal range    BP Readings from Last 1 Encounters:  06/05/21 (!) 142/90         Failed - Valid encounter within last 6 months    Recent Outpatient Visits          6 months ago Benign hypertension   [facility] Steele Sizer, MD   1 year ago Benign hypertension   [facility] Steele Sizer, MD   2 years ago Benign hypertension   [facility] Steele Sizer, MD   4 years ago Well woman exam   [facility] Steele Sizer, MD   5 years ago Well woman exam   [facility] Steele Sizer, MD      Future Appointments            In 1 week Lyndal Pulley, DO [place]   In 1 month Steele Sizer, MD [facility]           . metoprolol succinate (TOPROL-XL) 25 MG 24 hr tablet 45 tablet 1     Sig: Take 0.5 tablets (12.5 mg total) by mouth daily.     Cardiovascular:  Beta Blockers Failed - 07/07/2021  5:28 PM      Failed - Last BP in normal range    BP Readings from Last 1 Encounters:  06/05/21 (!) 142/90         Failed - Valid encounter within last 6 months    Recent Outpatient Visits          6 months ago Benign hypertension   [facility] Steele Sizer, MD   1 year ago Benign hypertension   [facility] Steele Sizer, MD   2 years ago Benign hypertension   [facility] Steele Sizer, MD   4 years ago Well woman exam   [facility] Steele Sizer, MD   5 years ago Well woman exam   [facility] Steele Sizer, MD  Future Appointments            In 1 week Lyndal Pulley, DO [place]   In 1 month Steele Sizer, MD [facility] [place] Heart Rate in normal range    Pulse Readings from Last 1 Encounters:  06/05/21 77

## 2021-07-08 NOTE — Therapy (Signed)
[place], [place], [address] Phone: [phone]   Fax:  [phone]  Physical Therapy Treatment  Patient Details  Name: FARRAN AMSDEN MRN: [id number] Date of Birth: [birth year] Referring Provider (PT): Lyndal Pulley, DO   Encounter Date: 07/08/2021   PT End of Session - 07/08/21 0737     Visit Number 7    Number of Visits 17    Date for PT Re-Evaluation 08/16/21    Authorization Type [place] [employer]    Progress Note Due on Visit 10    PT Start Time 0723    PT Stop Time 0802    PT Time Calculation (min) 39 min    Activity Tolerance Patient tolerated treatment well    Behavior During Therapy [facility] for tasks assessed/performed             Past Medical History:  Diagnosis Date   Allergy    Anemia    Eczema    GERD (gastroesophageal reflux disease)    Hematuria    Hyperlipidemia    Hypertension    Kidney stone on left side    Lump in female breast    seen by Dr. Fleet Contras   Metabolic syndrome    Microscopic hematuria    Vitreous detachment of right eye 08/22/2019   Dr. George Ina,  [facility]    Past Surgical History:  Procedure Laterality Date   oral surgery  Left 10/2020   molar tooth implant    URETHRAL DILATION  Age 78    There were no vitals filed for this visit.   Subjective Assessment - 07/08/21 0725     Subjective Overall better, approx. 40% since coming to PT, even with being up on her feet alot. The R ankle/foot is a little swollen today.    Patient Stated Goals To return to dancing and to start playing tennis    Currently in Pain? Yes    Pain Score 3     Pain Location Ankle    Pain Orientation Right    Pain Descriptors / Indicators Constant    Pain Type Chronic pain    Pain Onset More than a month ago    Pain Frequency Intermittent    Aggravating Factors  Walking and lifting stuff    Pain Relieving Factors RICE    Pain Score 0    Pain Location Knee     Pain Orientation Left    Pain Descriptors / Indicators Aching    Pain Type Chronic pain    Pain Onset More than a month ago    Pain Frequency Occasional    Aggravating Factors  prolonged standing            [facility] Adult PT Treatment:  Manual Therapy: STM to the R medial and lateral gastroc. Skilled plapation revealed a taut band with 2 trigger points    .Trigger Point Dry Needling Treatment: Pre-treatment instruction: Patient instructed on dry needling rationale, procedures, and possible side effects including pain during treatment (achy,cramping feeling), bruising, drop of blood, lightheadedness, nausea, sweating. Patient Consent Given: Yes Education handout provided: Yes Muscles treated: Medial gastroc, 2 locations  Needle size and number:  .30x53mm, 1 Electrical stimulation performed: No Parameters: N/A Treatment response/outcome: Twitch response elicited and Palpable decrease in muscle tension Post-treatment instructions: Patient instructed to expect possible mild to moderate muscle soreness later today and/or tomorrow. Patient instructed in methods to reduce muscle soreness and to continue prescribed HEP. If patient  was dry needled over the lung field, patient was instructed on signs and symptoms of pneumothorax and, however unlikely, to see immediate medical attention should they occur. Patient was also educated on signs and symptoms of infection and to seek medical attention should they occur. Patient verbalized understanding of these instructions and education.                   [facility] Adult PT Treatment/Exercise - 07/08/21 0001       Knee/Hip Exercises: Aerobic   Recumbent Bike L2 x 7 minutes      Ankle Exercises: Stretches   Soleus Stretch 2 reps   30 sec   Gastroc Stretch 2 reps;30 seconds    Slant Board Stretch 2 reps;30 seconds   for both gastroc and soleus     Ankle Exercises: Standing   Heel Raises Right;Left;20 reps   20 reps, concentric both and  eccentric R                      PT Short Term Goals - 06/26/21 0726       PT SHORT TERM GOAL #1   Title Pt will be Ind in an initial HEP    Baseline started on eval; 06/26/21: independent with HEP    Period Weeks    Status Achieved    Target Date 07/03/21      PT SHORT TERM GOAL #2   Title Pt will voice understanding of measures to assist in pain reduction    Baseline 06/26/21: pt verbalizes ice, compression and elevation when she exacerbates her pain.    Period Weeks    Status Achieved    Target Date 07/03/21               PT Long Term Goals - 07/03/21 1013       PT LONG TERM GOAL #1   Title Improve pt' bilat ankle AROM for DF to 10d to reduce achilles strain    Baseline Bilat 0d    Period Weeks    Status On-going      PT LONG TERM GOAL #2   Title Pt will be able to line dance for 30 mins without development of R achilles pain    Period Weeks    Status On-going      PT LONG TERM GOAL #3   Baseline R acilles 50%, L knee 65%    Period Weeks    Status On-going      PT LONG TERM GOAL #4   Title Pt will be Ind in a final HEP to maintain achieved LOF    Period Weeks    Status On-going      PT LONG TERM GOAL #5   Title Improve Bilat hip strength to 4+/5 for support of her ankles    Period Weeks    Status On-going                   Plan - 07/08/21 6387     Clinical Impression Statement PT was completed for STM to the R gastroc. During STM a taut band was IDed with 2 areas of TPs in the medial gastroc. TPDN was then provided. Following TPDN, ther ex was completed for gastroc and soleus stretching and strenghtening. While completing the stretching exs, pt reported a decrease in the tightness of her calf.    Personal Factors and Comorbidities Past/Current Experience;Time since onset of injury/illness/exacerbation;Comorbidity 2    Comorbidities High BMI. HTN  Examination-Activity Limitations Squat;Stand;Locomotion Level    Clinical  Decision Making Low    Rehab Potential Good    PT Frequency 2x / week    PT Duration 8 weeks    PT Treatment/Interventions ADLs/Self Care Home Management;Aquatic Therapy;Electrical Stimulation;Cryotherapy;Iontophoresis 4mg /ml Dexamethasone;Moist Heat;Ultrasound;Balance training;Therapeutic exercise;Therapeutic activities;Functional mobility training;Stair training;Gait training;Dry needling;Passive range of motion;Taping    PT Next Visit Plan Assess repsonse to HEP Progress ther ex for ROM and strengthening. Assess response to TPDN, re-assess FOTO.    PT Home Exercise Plan [phone]    Consulted and Agree with Plan of Care Patient             Patient will benefit from skilled therapeutic intervention in order to improve the following deficits and impairments:  Difficulty walking, Decreased activity tolerance, Impaired flexibility, Decreased range of motion, Pain  Visit Diagnosis: Achilles tendinitis of right lower extremity  Patellofemoral syndrome of left knee  Decreased range of motion of right ankle  Chronic pain of right ankle     Problem List Patient Active Problem List   Diagnosis Date Noted   Achilles tendinitis of right lower extremity 05/07/2021   Patellofemoral syndrome of left knee 05/07/2021   Low back pain with sciatica 02/24/2015   Dyslipidemia 02/24/2015   Dermatitis, eczematoid 02/24/2015   Acid reflux 02/24/2015   Benign hypertension 02/24/2015   Iron deficiency 24/49/7530   Dysmetabolic syndrome 01/08/210   Obesity (BMI 30.0-34.9) 02/24/2015   Allergic rhinitis 02/24/2015   Hematuria, microscopic 06/04/2010    Gar Ponto, PT 07/08/2021, 1:06 PM  [facility] [address] [place], [place], [address] Phone: [phone]   Fax:  [phone]  Name: CHASELYN NANNEY MRN: [id number] Date of Birth: 01-Mar-1966

## 2021-07-10 ENCOUNTER — Other Ambulatory Visit: Payer: Self-pay

## 2021-07-10 ENCOUNTER — Encounter: Payer: Self-pay | Admitting: Physical Therapy

## 2021-07-10 ENCOUNTER — Ambulatory Visit: Payer: 59 | Admitting: Physical Therapy

## 2021-07-10 DIAGNOSIS — M7661 Achilles tendinitis, right leg: Secondary | ICD-10-CM

## 2021-07-10 DIAGNOSIS — M222X2 Patellofemoral disorders, left knee: Secondary | ICD-10-CM

## 2021-07-10 DIAGNOSIS — M25671 Stiffness of right ankle, not elsewhere classified: Secondary | ICD-10-CM

## 2021-07-10 DIAGNOSIS — G8929 Other chronic pain: Secondary | ICD-10-CM | POA: Diagnosis not present

## 2021-07-10 DIAGNOSIS — M25571 Pain in right ankle and joints of right foot: Secondary | ICD-10-CM | POA: Diagnosis not present

## 2021-07-10 NOTE — Therapy (Signed)
[place], [place], [address] Phone: [phone]   Fax:  [phone]  Physical Therapy Treatment  Patient Details  Name: Sydney [name] MRN: [id number] Date of Birth: March 05, 1966 Referring Provider (PT): Lyndal Pulley, DO   Encounter Date: 07/10/2021   PT End of Session - 07/10/21 0732     Visit Number 8    Number of Visits 17    Date for PT Re-Evaluation 08/16/21    Authorization Type [place] [employer]    Progress Note Due on Visit 10    PT Start Time 0730    PT Stop Time 0800    PT Time Calculation (min) 30 min             Past Medical History:  Diagnosis Date   Allergy    Anemia    Eczema    GERD (gastroesophageal reflux disease)    Hematuria    Hyperlipidemia    Hypertension    Kidney stone on left side    Lump in female breast    seen by Dr. Fleet Contras   Metabolic syndrome    Microscopic hematuria    Vitreous detachment of right eye 08/22/2019   Dr. George Ina,  [facility]    Past Surgical History:  Procedure Laterality Date   oral surgery  Left 10/2020   molar tooth implant    URETHRAL DILATION  Age 34    There were no vitals filed for this visit.   Subjective Assessment - 07/10/21 0731     Subjective The dry needling helped alot. I started feeling medial and lateral ankle pain after being on feet all day yesterday. That is better than usual.    Currently in Pain? No/denies                [facility] PT Assessment - 07/10/21 0001       Observation/Other Assessments   Focus on Therapeutic Outcomes (FOTO)  57% R ankle;  65% L knee      AROM   Right Ankle Dorsiflexion 8    Left Ankle Dorsiflexion 8                           [facility] Adult PT Treatment/Exercise - 07/10/21 0001       Knee/Hip Exercises: Aerobic   Recumbent Bike L2 x 5 minutes      Knee/Hip Exercises: Standing   Lateral Step Up 15 reps;Hand Hold: 0;Step Height: 6"     Lateral Step Up Limitations cues for knee alignment    Rebounder SLS Right and Left with red ball toss 10 x 2 each   1 set on blue oval     Ankle Exercises: Standing   Heel Raises Right;Left;20 reps   20 reps, concentric both and eccentric R     Ankle Exercises: Stretches   Slant Board Stretch 2 reps;30 seconds   for both gastroc and soleus                      PT Short Term Goals - 06/26/21 0726       PT SHORT TERM GOAL #1   Title Pt will be Ind in an initial HEP    Baseline started on eval; 06/26/21: independent with HEP    Period Weeks    Status Achieved    Target Date 07/03/21      PT SHORT TERM GOAL #2  Title Pt will voice understanding of measures to assist in pain reduction    Baseline 06/26/21: pt verbalizes ice, compression and elevation when she exacerbates her pain.    Period Weeks    Status Achieved    Target Date 07/03/21               PT Long Term Goals - 07/10/21 1032       PT LONG TERM GOAL #1   Title Improve pt' bilat ankle AROM for DF to 10d to reduce achilles strain    Baseline Bilat 0d; improved to 8 degrees bilateral    Period Weeks    Status Partially Met      PT LONG TERM GOAL #2   Title Pt will be able to line dance for 30 mins without development of R achilles pain    Period Weeks    Status Unable to assess      PT LONG TERM GOAL #3   Title Pt FOTO score fo funtional ability will improve to 67% for the R achilles,and 74% for the L knee    Baseline R acilles 50%, L knee 65%; 07/10/21 R achille 57%, L knee 65%    Period Weeks    Status On-going      PT LONG TERM GOAL #4   Title Pt will be Ind in a final HEP to maintain achieved LOF    Period Weeks      PT LONG TERM GOAL #5   Title Improve Bilat hip strength to 4+/5 for support of her ankles    Baseline 4/5    Period Weeks    Status Unable to assess                   Plan - 07/10/21 1026     Clinical Impression Statement Pt arrives late today so shorter  session. She reports improvement with TPDN and is receptive to more TPDN for her ankle. FOTO score captured and showed improvement in ankle and no change with knee score. DF AROM improved from 0 to 8 degrees bilateral. Continued with LE strength and stability. She feels some tightness in posterior ankle/heel that could benefit from additional TPDN.    PT Treatment/Interventions ADLs/Self Care Home Management;Aquatic Therapy;Electrical Stimulation;Cryotherapy;Iontophoresis 92m/ml Dexamethasone;Moist Heat;Ultrasound;Balance training;Therapeutic exercise;Therapeutic activities;Functional mobility training;Stair training;Gait training;Dry needling;Passive range of motion;Taping    PT Next Visit Plan Assess repsonse to HEP Progress ther ex for ROM and strengthening. [facility]   PT Home Exercise Plan [id number]            Patient will benefit from skilled therapeutic intervention in order to improve the following deficits and impairments:  Difficulty walking, Decreased activity tolerance, Impaired flexibility, Decreased range of motion, Pain  Visit Diagnosis: Achilles tendinitis of right lower extremity  Patellofemoral syndrome of left knee  Decreased range of motion of right ankle  Chronic pain of right ankle     Problem List Patient Active Problem List   Diagnosis Date Noted   Achilles tendinitis of right lower extremity 05/07/2021   Patellofemoral syndrome of left knee 05/07/2021   Low back pain with sciatica 02/24/2015   Dyslipidemia 02/24/2015   Dermatitis, eczematoid 02/24/2015   Acid reflux 02/24/2015   Benign hypertension 02/24/2015   Iron deficiency 072/53/6644  Dysmetabolic syndrome 003/47/4259  Obesity (BMI 30.0-34.9) 02/24/2015   Allergic rhinitis 02/24/2015   Hematuria, microscopic 06/04/2010    DDorene Ar PTA 07/10/2021, 10:39 AM  [facility] [address] [place], [place],  [address] Phone: [phone]   Fax:  [phone]  Name: Sydney [name] MRN: [id number] Date of Birth: Feb 02, 1966

## 2021-07-15 ENCOUNTER — Ambulatory Visit: Payer: 59

## 2021-07-15 ENCOUNTER — Other Ambulatory Visit: Payer: Self-pay

## 2021-07-15 DIAGNOSIS — G8929 Other chronic pain: Secondary | ICD-10-CM | POA: Diagnosis not present

## 2021-07-15 DIAGNOSIS — M25671 Stiffness of right ankle, not elsewhere classified: Secondary | ICD-10-CM

## 2021-07-15 DIAGNOSIS — M7661 Achilles tendinitis, right leg: Secondary | ICD-10-CM

## 2021-07-15 DIAGNOSIS — M222X2 Patellofemoral disorders, left knee: Secondary | ICD-10-CM

## 2021-07-15 DIAGNOSIS — M25571 Pain in right ankle and joints of right foot: Secondary | ICD-10-CM | POA: Diagnosis not present

## 2021-07-16 NOTE — Therapy (Signed)
[place], [place], [address] Phone: [phone]   Fax:  [phone]  Physical Therapy Treatment  Patient Details  Name: Sydney [name] MRN: [id number] Date of Birth: 12-09-1965 Referring Provider (PT): Lyndal Pulley, DO   Encounter Date: 07/15/2021   PT End of Session - 07/15/21 0752     Visit Number 9    Number of Visits 17    Date for PT Re-Evaluation 08/16/21    Authorization Type [place] [employer]    Progress Note Due on Visit 11    PT Start Time 0723    PT Stop Time 0803    PT Time Calculation (min) 40 min    Activity Tolerance Patient tolerated treatment well    Behavior During Therapy [facility] for tasks assessed/performed             Past Medical History:  Diagnosis Date   Allergy    Anemia    Eczema    GERD (gastroesophageal reflux disease)    Hematuria    Hyperlipidemia    Hypertension    Kidney stone on left side    Lump in female breast    seen by Dr. Fleet Contras   Metabolic syndrome    Microscopic hematuria    Vitreous detachment of right eye 08/22/2019   Dr. George Ina,  [facility]    Past Surgical History:  Procedure Laterality Date   oral surgery  Left 10/2020   molar tooth implant    URETHRAL DILATION  Age 55    There were no vitals filed for this visit.   Subjective Assessment - 07/15/21 0725     Subjective Recovering well, tolerating more time on her feet with lesspain than what she would normall experience.    Patient Stated Goals To return to dancing and to start playing tennis    Currently in Pain? Yes    Pain Score 2     Pain Location Ankle    Pain Orientation Medial;Posterior    Pain Descriptors / Indicators Aching    Pain Type Chronic pain    Pain Onset More than a month ago    Pain Frequency Intermittent    Pain Location Knee    Pain Orientation Left    Pain Descriptors / Indicators Aching    Pain Type Chronic pain    Pain Onset More than  a month ago    Pain Frequency Occasional                               [facility] Adult PT Treatment/Exercise - 07/16/21 0001       Exercises   Exercises Ankle      Knee/Hip Exercises: Aerobic   Recumbent Bike L2 x 5 minutes      Manual Therapy   Manual Therapy Soft tissue mobilization;Other (comment)    Soft tissue mobilization STM to the R gastroc and peroneals and cross friction massage to the lateral aspect of the Rt Achiiles tendon and peroneal tendons.    Other Manual Therapy TPDN-See note below      Ankle Exercises: Stretches   Plantar Fascia Stretch Limitations Seated with ankle DF and ext of toes, 2x30 sec    Soleus Stretch 2 reps   30 sec   Gastroc Stretch 2 reps;30 seconds    Other Stretch Evertor stretch in sitting, 2x30"      Ankle Exercises: Standing  Heel Raises Right;Left;20 reps   15 reps, concentric both. 15 reps B concentric,  eccentric R   Other Standing Ankle Exercises lateral step ups and forward steps ups, 2x10 each, to Bosu ball with UE assist             Trigger Point Dry Needling Treatment: Pre-treatment instruction: Patient instructed on dry needling rationale, procedures, and possible side effects including pain during treatment (achy,cramping feeling), bruising, drop of blood, lightheadedness, nausea, sweating. Patient Consent Given: Yes Education handout provided: Previously provided Muscles treated: Following skilled palpation provided TPDN to the R quadratus plantae, FDB, peroneal longus and brevis Needle size and number: .25x3mm x 1 and .30x13mm x 1 Electrical stimulation performed: No Parameters: N/A Treatment response/outcome: Twitch response elicited and Palpable decrease in muscle tension Post-treatment instructions: Patient instructed to expect possible mild to moderate muscle soreness later today and/or tomorrow. Patient instructed in methods to reduce muscle soreness and to continue prescribed HEP. If patient was dry  needled over the lung field, patient was instructed on signs and symptoms of pneumothorax and, however unlikely, to see immediate medical attention should they occur. Patient was also educated on signs and symptoms of infection and to seek medical attention should they occur. Patient verbalized understanding of these instructions and education.         PT Short Term Goals - 06/26/21 0726       PT SHORT TERM GOAL #1   Title Pt will be Ind in an initial HEP    Baseline started on eval; 06/26/21: independent with HEP    Period Weeks    Status Achieved    Target Date 07/03/21      PT SHORT TERM GOAL #2   Title Pt will voice understanding of measures to assist in pain reduction    Baseline 06/26/21: pt verbalizes ice, compression and elevation when she exacerbates her pain.    Period Weeks    Status Achieved    Target Date 07/03/21               PT Long Term Goals - 07/10/21 1032       PT LONG TERM GOAL #1   Title Improve pt' bilat ankle AROM for DF to 10d to reduce achilles strain    Baseline Bilat 0d; improved to 8 degrees bilateral    Period Weeks    Status Partially Met      PT LONG TERM GOAL #2   Title Pt will be able to line dance for 30 mins without development of R achilles pain    Period Weeks    Status Unable to assess      PT LONG TERM GOAL #3   Title Pt FOTO score fo funtional ability will improve to 67% for the R achilles,and 74% for the L knee    Baseline R acilles 50%, L knee 65%; 07/10/21 R achille 57%, L knee 65%    Period Weeks    Status On-going      PT LONG TERM GOAL #4   Title Pt will be Ind in a final HEP to maintain achieved LOF    Period Weeks      PT LONG TERM GOAL #5   Title Improve Bilat hip strength to 4+/5 for support of her ankles    Baseline 4/5    Period Weeks    Status Unable to assess                   Plan -  07/15/21 0756     Clinical Impression Statement Pt reports heel and lateral ankle pain discomfort today.  STM was provided to the grastroc and peroneals. Skilled palpation revealed TPs of the peroneals and tenderness of the R heel. TPDN was completed. See note above. following TPDN, stretching and strengthening ther ex for the r calf and ankle were completed. Pt tolerated the session well without adverse effects. Overall, pt is progressiong well with decreased pain and improved tolerance to activity.    Personal Factors and Comorbidities Past/Current Experience;Time since onset of injury/illness/exacerbation;Comorbidity 2    Comorbidities High BMI. HTN    Examination-Activity Limitations Squat;Stand;Locomotion Level    Stability/Clinical Decision Making Stable/Uncomplicated    Clinical Decision Making Low    Rehab Potential Good    PT Frequency 2x / week    PT Duration 8 weeks    PT Treatment/Interventions ADLs/Self Care Home Management;Aquatic Therapy;Electrical Stimulation;Cryotherapy;Iontophoresis 4mg /ml Dexamethasone;Moist Heat;Ultrasound;Balance training;Therapeutic exercise;Therapeutic activities;Functional mobility training;Stair training;Gait training;Dry needling;Passive range of motion;Taping    PT Next Visit Plan Assess repsonse to HEP Progress ther ex for ROM and strengthening. Assess response to [facility]    PT Home Exercise Plan [id number]    Consulted and Agree with Plan of Care Patient             Patient will benefit from skilled therapeutic intervention in order to improve the following deficits and impairments:  Difficulty walking, Decreased activity tolerance, Impaired flexibility, Decreased range of motion, Pain  Visit Diagnosis: Achilles tendinitis of right lower extremity  Decreased range of motion of right ankle  Patellofemoral syndrome of left knee  Chronic pain of right ankle     Problem List Patient Active Problem List   Diagnosis Date Noted   Achilles tendinitis of right lower extremity 05/07/2021   Patellofemoral syndrome of left knee 05/07/2021   Low back  pain with sciatica 02/24/2015   Dyslipidemia 02/24/2015   Dermatitis, eczematoid 02/24/2015   Acid reflux 02/24/2015   Benign hypertension 02/24/2015   Iron deficiency 58/83/2549   Dysmetabolic syndrome 82/64/1583   Obesity (BMI 30.0-34.9) 02/24/2015   Allergic rhinitis 02/24/2015   Hematuria, microscopic 06/04/2010    Gar Ponto MS, PT 07/16/21 8:02 AM   [facility] [address] [place], [place], [address] Phone: [phone]   Fax:  [phone]  Name: Sydney [name] MRN: [id number] Date of Birth: 08-10-1966

## 2021-07-17 ENCOUNTER — Other Ambulatory Visit: Payer: Self-pay

## 2021-07-17 ENCOUNTER — Ambulatory Visit: Payer: 59 | Admitting: Physical Therapy

## 2021-07-17 DIAGNOSIS — M25671 Stiffness of right ankle, not elsewhere classified: Secondary | ICD-10-CM

## 2021-07-17 DIAGNOSIS — M222X2 Patellofemoral disorders, left knee: Secondary | ICD-10-CM | POA: Diagnosis not present

## 2021-07-17 DIAGNOSIS — M7661 Achilles tendinitis, right leg: Secondary | ICD-10-CM | POA: Diagnosis not present

## 2021-07-17 DIAGNOSIS — M25571 Pain in right ankle and joints of right foot: Secondary | ICD-10-CM

## 2021-07-17 DIAGNOSIS — G8929 Other chronic pain: Secondary | ICD-10-CM | POA: Diagnosis not present

## 2021-07-17 NOTE — Therapy (Signed)
[place], [place], [address] Phone: [phone]   Fax:  [phone]  Physical Therapy Treatment  Patient Details  Name: Sydney [name] MRN: [id number] Date of Birth: February 20, 1966 Referring Provider (PT): Lyndal Pulley, DO   Encounter Date: 07/17/2021   PT End of Session - 07/17/21 0813     Visit Number 10    Number of Visits 17    Date for PT Re-Evaluation 08/16/21    Authorization Type [facility]    Progress Note Due on Visit 11    PT Start Time 0720    PT Stop Time 0805    PT Time Calculation (min) 45 min             Past Medical History:  Diagnosis Date   Allergy    Anemia    Eczema    GERD (gastroesophageal reflux disease)    Hematuria    Hyperlipidemia    Hypertension    Kidney stone on left side    Lump in female breast    seen by Dr. Fleet Contras   Metabolic syndrome    Microscopic hematuria    Vitreous detachment of right eye 08/22/2019   Dr. George Ina,  [facility]    Past Surgical History:  Procedure Laterality Date   oral surgery  Left 10/2020   molar tooth implant    URETHRAL DILATION  Age 30    There were no vitals filed for this visit.       [facility] Adult PT Treatment/Exercise - 07/17/21 0001       Knee/Hip Exercises: Aerobic   Recumbent Bike L2 x 5 minutes      Knee/Hip Exercises: Standing   Step Down 15 reps;Step Height: 4";Right;Left    Rebounder SLS Right and Left with red ball toss 10 x 2 each   on blue oval     Ankle Exercises: Stretches   Soleus Stretch 2 reps   30 sec   Gastroc Stretch 2 reps;30 seconds    Slant Board Stretch 60 seconds   gastroc and soleus at end of session.     Ankle Exercises: Standing   Heel Raises Right;Left;20 reps   15 reps, concentric both. 15 reps B concentric,  eccentric R   Heel Raises Limitations also heel raises off 2 inch step x 20 bilat    Other Standing Ankle Exercises BOSU- balance on flat side:  wide with A/P and lateral weight shifts, narrow stance with A/P weight shifts, wide stance with squats x 10- all without UE    Other Standing Ankle Exercises Forward step ups on BUSU x 25 each , balance on Bosu bilateral x 1 minute (dome side)                       PT Short Term Goals - 06/26/21 0726       PT SHORT TERM GOAL #1   Title Pt will be Ind in an initial HEP    Baseline started on eval; 06/26/21: independent with HEP    Period Weeks    Status Achieved    Target Date 07/03/21      PT SHORT TERM GOAL #2   Title Pt will voice understanding of measures to assist in pain reduction    Baseline 06/26/21: pt verbalizes ice, compression and elevation when she exacerbates her pain.    Period Weeks    Status Achieved    Target Date  07/03/21               PT Long Term Goals - 07/10/21 1032       PT LONG TERM GOAL #1   Title Improve pt' bilat ankle AROM for DF to 10d to reduce achilles strain    Baseline Bilat 0d; improved to 8 degrees bilateral    Period Weeks    Status Partially Met      PT LONG TERM GOAL #2   Title Pt will be able to line dance for 30 mins without development of R achilles pain    Period Weeks    Status Unable to assess      PT LONG TERM GOAL #3   Title Pt FOTO score fo funtional ability will improve to 67% for the R achilles,and 74% for the L knee    Baseline R acilles 50%, L knee 65%; 07/10/21 R achille 57%, L knee 65%    Period Weeks    Status On-going      PT LONG TERM GOAL #4   Title Pt will be Ind in a final HEP to maintain achieved LOF    Period Weeks      PT LONG TERM GOAL #5   Title Improve Bilat hip strength to 4+/5 for support of her ankles    Baseline 4/5    Period Weeks    Status Unable to assess                   Plan - 07/17/21 0819     Clinical Impression Statement Pt reports min right ankle and knee pain on arrival and reports more improvement in heel pain following last session of TPDN. Continued  to advanced dynamic balance using BOSU ball and contined with closed chain eccentric quad strength. Pt reports significant improvement in Right ankle and would like to focus remaining sessions on VMO strength for left knee.    PT Treatment/Interventions ADLs/Self Care Home Management;Aquatic Therapy;Electrical Stimulation;Cryotherapy;Iontophoresis 24m/ml Dexamethasone;Moist Heat;Ultrasound;Balance training;Therapeutic exercise;Therapeutic activities;Functional mobility training;Stair training;Gait training;Dry needling;Passive range of motion;Taping    PT Next Visit Plan re-check goals, AROM, MMT- pt wants to focus left knee strength    PT Home Exercise Plan [id number]   Consulted and Agree with Plan of Care Patient             Patient will benefit from skilled therapeutic intervention in order to improve the following deficits and impairments:  Difficulty walking, Decreased activity tolerance, Impaired flexibility, Decreased range of motion, Pain  Visit Diagnosis: Achilles tendinitis of right lower extremity  Decreased range of motion of right ankle  Patellofemoral syndrome of left knee  Chronic pain of right ankle     Problem List Patient Active Problem List   Diagnosis Date Noted   Achilles tendinitis of right lower extremity 05/07/2021   Patellofemoral syndrome of left knee 05/07/2021   Low back pain with sciatica 02/24/2015   Dyslipidemia 02/24/2015   Dermatitis, eczematoid 02/24/2015   Acid reflux 02/24/2015   Benign hypertension 02/24/2015   Iron deficiency 073/56/7014  Dysmetabolic syndrome 010/30/1314  Obesity (BMI 30.0-34.9) 02/24/2015   Allergic rhinitis 02/24/2015   Hematuria, microscopic 06/04/2010    DDorene Ar PTA 07/17/2021, 8:30 AM  [facility] [address]Phone: [phone]  Fax:  [phone] Name: Sydney LARCOMMRN: [id number] of Birth:  101-11-1965

## 2021-07-18 NOTE — Progress Notes (Signed)
[place] [facility] [place] [address] [place] Phone: [phone] Subjective:   IVilma Meckel, am serving as a scribe for Dr. Hulan Saas. This visit occurred during the SARS-CoV-2 public health emergency.  Safety protocols were in place, including screening questions prior to the visit, additional usage of staff PPE, and extensive cleaning of exam room while observing appropriate contact time as indicated for disinfecting solutions.   I'm seeing this patient by the request  of:  Steele Sizer, MD  CC:   [id number]  06/05/2021 Improvement has been made on ultrasound today.  Patient still has some calcific changes as well as some mild increase in neovascularization.  Discussed with patient to continue the brace, we discussed over-the-counter shoes that can be beneficial as well.  We discussed icing regimen and home exercises as well as with patient having difficulty with doing them on her own and will probably have a better efficiency we are going to physical therapy.  Update 07/21/2021 Sydney [name] is a 55 y.o. female coming in with complaint of R achilles pain. Patient states progressively getting better. PT is definitely helping. Feels like she has gotten a lot better. Is also doing some for the left knee in PT. No new complaints.      Past Medical History:  Diagnosis Date   Allergy    Anemia    Eczema    GERD (gastroesophageal reflux disease)    Hematuria    Hyperlipidemia    Hypertension    Kidney stone on left side    Lump in female breast    seen by Dr. Fleet Contras   Metabolic syndrome    Microscopic hematuria    Vitreous detachment of right eye 08/22/2019   Dr. 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  Tobacco Use   Smoking status: Never   Smokeless tobacco: Never  Vaping Use   Vaping Use: Never used  Substance and Sexual Activity   Alcohol use: No    Alcohol/week: 0.0 standard drinks   Drug use: No   Sexual activity: Not Currently  Other Topics Concern   Not on file  Social History Narrative   Works for [employer], lives alone   She has one Neurosurgeon   Mother is in memory care now    Social Determinants of Health   Financial Resource Strain: Not on file  Food Insecurity: Not on file  Transportation Needs: Not on file  Physical Activity: Not on file  Stress: Not on file  Social Connections: Not on file   Allergies  Allergen Reactions   Amoxicillin    Sulfa Antibiotics    Family History  Problem Relation Age of Onset   Hypertension Mother    Hyperlipidemia Mother    Dementia Mother    Diabetes Father    CAD Father    Psoriasis Father    Heart disease Father    Cancer Father  Prostate and Multiple Myeloma   Asthma Sister    Thyroid disease Sister    Diabetes Sister    Breast cancer Neg Hx      Current Outpatient Medications (Cardiovascular):    hydrochlorothiazide (MICROZIDE) 12.5 MG capsule, TAKE 1 CAPSULE BY MOUTH DAILY.   metoprolol succinate (TOPROL-XL) 25 MG 24 hr tablet, Take 0.5 tablets (12.5 mg total) by mouth daily.   Current Outpatient Medications (Analgesics):    ibuprofen (ADVIL,MOTRIN) 100 MG tablet, Take 100 mg by mouth 2 (two) times daily as needed for fever.   meloxicam (MOBIC) 15 MG tablet, Take 1 tablet (15 mg total) by mouth daily.   Current Outpatient Medications (Other):    Calcium-Phosphorus-Vitamin D (CITRACAL +D3 PO), Take 2 tablets by mouth daily.   Clindamycin-Benzoyl Per, Refr, gel, Apply 1 g topically 2 (two) times  daily.   desonide (DESOWEN) 0.05 % ointment, Apply twice daily to affected eyelids until clear, and then discontinue   Misc Natural Products (AIRBORNE ELDERBERRY) CHEW, Chew 2 each by mouth daily. Nature's Bounty Elderberry Gummies   Misc Natural Products (LUTEIN 20 PO), Take 1 each by mouth daily.   Multiple Vitamins-Minerals (CENTRUM ADULTS PO), Take by mouth.   OVER THE COUNTER MEDICATION, Take 2 each by mouth 2 (two) times daily. Bio Complete 3   tretinoin (RETIN-A) 0.025 % cream, Apply a pea size amount to dry face nightly   Reviewed prior external information including notes and imaging from  primary care provider As well as notes that were available from care everywhere and other healthcare systems.  Past medical history, social, surgical and family history all reviewed in electronic medical record.  No pertanent information unless stated regarding to the chief complaint.   Review of Systems:  No headache, visual changes, nausea, vomiting, diarrhea, constipation, dizziness, abdominal pain, skin rash, fevers, chills, night sweats, weight loss, swollen lymph nodes, body aches, joint swelling, chest pain, shortness of breath, mood changes. POSITIVE muscle aches  Objective  Blood pressure 136/82, pulse 81, height $RemoveBe'5\' 3"'nLnOjHNtk$  (1.6 m), weight 175 lb (79.4 kg), last menstrual period 07/21/2015, SpO2 97 %.   General: No apparent distress alert and oriented x3 mood and affect normal, dressed appropriately.  HEENT: Pupils equal, extraocular movements intact  Respiratory: Patient's speak in full sentences and does not appear short of breath  Cardiovascular: No lower extremity edema, non tender, no erythema  Gait normal with good balance and coordination.  MSK: Right ankle exam shows the patient does have significant less swelling noted on the posterior aspect of the Achilles.  Patient is very minimal tender.  Good range of motion of the ankle noted.  Haglund nodule noted.  Limited muscular skeletal  ultrasound was performed and interpreted by Hulan Saas, M  Limited ultrasound shows significant decrease in the hypoechoic changes in the calcific changes noted previously the patient does still have neovascularization within the tendon itself that corresponds with likely intersubstance tearing noted.  No retractions are noted. Impression: Continued interstitial tearing of the tendon.  Decreased calcific changes noted with interval improvement of the calcific tendinitis.   Impression and Recommendations:     The above documentation has been reviewed and is accurate and complete Lyndal Pulley, DO

## 2021-07-21 ENCOUNTER — Other Ambulatory Visit: Payer: Self-pay

## 2021-07-21 ENCOUNTER — Ambulatory Visit: Payer: Self-pay

## 2021-07-21 ENCOUNTER — Encounter: Payer: Self-pay | Admitting: Family Medicine

## 2021-07-21 ENCOUNTER — Ambulatory Visit: Payer: 59 | Admitting: Family Medicine

## 2021-07-21 VITALS — BP 136/82 | HR 81 | Ht 63.0 in | Wt 175.0 lb

## 2021-07-21 DIAGNOSIS — M7661 Achilles tendinitis, right leg: Secondary | ICD-10-CM

## 2021-07-21 NOTE — Patient Instructions (Signed)
Continue exercises PT once a week Heel lifts See me again in 6-8 weeks

## 2021-07-21 NOTE — Assessment & Plan Note (Signed)
Continued improvement noted.  Still though on ultrasound does have increasing neovascularization in the tendon itself.  Concern still for intersubstance tearing.  Significant improvement noted in the calcific changes that was noted.  Patient wants to continue with the conservative therapy and will decrease frequency of physical therapy to 1 time a week over the next 6 weeks.  Discussed and see patient again in 6 weeks to discuss further treatment options if necessary.

## 2021-07-22 ENCOUNTER — Ambulatory Visit: Payer: 59 | Admitting: Physical Therapy

## 2021-07-22 ENCOUNTER — Encounter: Payer: Self-pay | Admitting: Physical Therapy

## 2021-07-22 DIAGNOSIS — M25671 Stiffness of right ankle, not elsewhere classified: Secondary | ICD-10-CM

## 2021-07-22 DIAGNOSIS — M7661 Achilles tendinitis, right leg: Secondary | ICD-10-CM | POA: Diagnosis not present

## 2021-07-22 DIAGNOSIS — G8929 Other chronic pain: Secondary | ICD-10-CM

## 2021-07-22 DIAGNOSIS — M222X2 Patellofemoral disorders, left knee: Secondary | ICD-10-CM

## 2021-07-22 DIAGNOSIS — M25571 Pain in right ankle and joints of right foot: Secondary | ICD-10-CM | POA: Diagnosis not present

## 2021-07-22 NOTE — Therapy (Signed)
[place], [place], [address] Phone: [phone]   Fax:  [phone]  Physical Therapy Treatment  Patient Details  Name: Sydney [name] MRN: [id number] Date of Birth: [birth year] Referring Provider (PT): Lyndal Pulley, DO   Encounter Date: 07/22/2021   PT End of Session - 07/22/21 0732     Visit Number 11    Number of Visits 17    Date for PT Re-Evaluation 08/16/21    Authorization Type [facility]    Progress Note Due on Visit 11    PT Start Time 0725   10 min late   PT Stop Time 0800    PT Time Calculation (min) 35 min             Past Medical History:  Diagnosis Date   Allergy    Anemia    Eczema    GERD (gastroesophageal reflux disease)    Hematuria    Hyperlipidemia    Hypertension    Kidney stone on left side    Lump in female breast    seen by Dr. Fleet Contras   Metabolic syndrome    Microscopic hematuria    Vitreous detachment of right eye 08/22/2019   Dr. George Ina,  [facility]    Past Surgical History:  Procedure Laterality Date   oral surgery  Left 10/2020   molar tooth implant    URETHRAL DILATION  Age 70    There were no vitals filed for this visit.   Subjective Assessment - 07/22/21 0730     Subjective My ankle is hurting. The doctor saw inflammation on an ultrasound. Ankle is 7/10, I over did it this weekend all day walking. My knee has not been as bad.    Currently in Pain? Yes    Pain Score 7     Pain Location Ankle    Pain Orientation Medial;Lateral    Pain Descriptors / Indicators Aching    Pain Type Chronic pain    Aggravating Factors  walking    Pain Relieving Factors RICE, brace    Pain Score 0    Pain Location Knee    Pain Orientation Left    Pain Descriptors / Indicators Aching    Pain Type Chronic pain    Aggravating Factors  prolonged standing    Pain Relieving Factors rest                [facility] PT Assessment - 07/22/21  0001       Strength   Strength Assessment Site Hip    Right/Left Hip Right;Left    Right Hip ABduction 4/5    Left Hip ABduction 4/5                           [facility] Adult PT Treatment/Exercise - 07/22/21 0001       Knee/Hip Exercises: Stretches   Sports administrator 3 reps;30 seconds    Quad Stretch Limitations prone with strap      Knee/Hip Exercises: Aerobic   Recumbent Bike L2 x 5 minutes      Knee/Hip Exercises: Standing   Step Down 15 reps;Step Height: 4";Right;Left    Rebounder SLS Right and Left with red ball toss 10 x 2 each   on blue oval     Ankle Exercises: Standing   Heel Raises Right;Left;20 reps   15 reps, concentric both. 15 reps B concentric,  eccentric R  Other Standing Ankle Exercises BOSU- balance on flat side: wide with A/P and lateral weight shifts, narrow stance with A/P weight shifts, wide stance with squats x 10- all without UE    Other Standing Ankle Exercises Forward step ups on BUSU x 25 each , balance on Bosu bilateral x 1 minute (dome side)      Ankle Exercises: Stretches   Soleus Stretch 2 reps   30 sec   Gastroc Stretch 2 reps;30 seconds                       PT Short Term Goals - 06/26/21 0726       PT SHORT TERM GOAL #1   Title Pt will be Ind in an initial HEP    Baseline started on eval; 06/26/21: independent with HEP    Period Weeks    Status Achieved    Target Date 07/03/21      PT SHORT TERM GOAL #2   Title Pt will voice understanding of measures to assist in pain reduction    Baseline 06/26/21: pt verbalizes ice, compression and elevation when she exacerbates her pain.    Period Weeks    Status Achieved    Target Date 07/03/21               PT Long Term Goals - 07/10/21 1032       PT LONG TERM GOAL #1   Title Improve pt' bilat ankle AROM for DF to 10d to reduce achilles strain    Baseline Bilat 0d; improved to 8 degrees bilateral    Period Weeks    Status Partially Met      PT LONG TERM  GOAL #2   Title Pt will be able to line dance for 30 mins without development of R achilles pain    Period Weeks    Status Unable to assess      PT LONG TERM GOAL #3   Title Pt FOTO score fo funtional ability will improve to 67% for the R achilles,and 74% for the L knee    Baseline R acilles 50%, L knee 65%; 07/10/21 R achille 57%, L knee 65%    Period Weeks    Status On-going      PT LONG TERM GOAL #4   Title Pt will be Ind in a final HEP to maintain achieved LOF    Period Weeks      PT LONG TERM GOAL #5   Title Improve Bilat hip strength to 4+/5 for support of her ankles    Baseline 4/5    Period Weeks    Status Unable to assess                   Plan - 07/22/21 1856     Clinical Impression Statement Pt saw MD yesterday who saw increased inflammation in ankle and added bilateral heel inserts. She will also start ibuprofen 3 x per day today. She reports increased ankle pain after standing this weekend with her plumber all day to diagnose a problem at her home. She reports less knee pain lately. Continued with knee and ankle strengthening without c/o increased pain. Pt request to reduce to 1 x per week after seeeing MD.    PT Treatment/Interventions ADLs/Self Care Home Management;Aquatic Therapy;Electrical Stimulation;Cryotherapy;Iontophoresis 57m/ml Dexamethasone;Moist Heat;Ultrasound;Balance training;Therapeutic exercise;Therapeutic activities;Functional mobility training;Stair training;Gait training;Dry needling;Passive range of motion;Taping    PT Next Visit Plan re-check goals, AROM, MMT- pt wants to focus  left knee strength    PT Home Exercise Plan [phone]             Patient will benefit from skilled therapeutic intervention in order to improve the following deficits and impairments:  Difficulty walking, Decreased activity tolerance, Impaired flexibility, Decreased range of motion, Pain  Visit Diagnosis: Achilles tendinitis of right lower extremity  Decreased  range of motion of right ankle  Patellofemoral syndrome of left knee  Chronic pain of right ankle     Problem List Patient Active Problem List   Diagnosis Date Noted   Achilles tendinitis of right lower extremity 05/07/2021   Patellofemoral syndrome of left knee 05/07/2021   Low back pain with sciatica 02/24/2015   Dyslipidemia 02/24/2015   Dermatitis, eczematoid 02/24/2015   Acid reflux 02/24/2015   Benign hypertension 02/24/2015   Iron deficiency 44/17/1278   Dysmetabolic syndrome 71/83/6725   Obesity (BMI 30.0-34.9) 02/24/2015   Allergic rhinitis 02/24/2015   Hematuria, microscopic 06/04/2010    Dorene Ar, PTA 07/22/2021, 9:07 AM  [facility] [address] [place], [place], [address] Phone: [phone]   Fax:  [phone]  Name: VIHANA KYDD MRN: [id number] Date of Birth: 01/29/1966

## 2021-07-31 ENCOUNTER — Ambulatory Visit: Payer: 59 | Admitting: Physical Therapy

## 2021-08-05 ENCOUNTER — Ambulatory Visit: Payer: 59 | Admitting: Physical Therapy

## 2021-08-07 ENCOUNTER — Encounter: Payer: 59 | Admitting: Physical Therapy

## 2021-08-14 ENCOUNTER — Other Ambulatory Visit: Payer: Self-pay

## 2021-08-14 ENCOUNTER — Ambulatory Visit: Payer: 59 | Attending: Family Medicine

## 2021-08-14 DIAGNOSIS — M25671 Stiffness of right ankle, not elsewhere classified: Secondary | ICD-10-CM | POA: Diagnosis not present

## 2021-08-14 DIAGNOSIS — M7661 Achilles tendinitis, right leg: Secondary | ICD-10-CM | POA: Diagnosis not present

## 2021-08-14 DIAGNOSIS — G8929 Other chronic pain: Secondary | ICD-10-CM | POA: Insufficient documentation

## 2021-08-14 DIAGNOSIS — M222X2 Patellofemoral disorders, left knee: Secondary | ICD-10-CM | POA: Insufficient documentation

## 2021-08-14 DIAGNOSIS — M25571 Pain in right ankle and joints of right foot: Secondary | ICD-10-CM | POA: Diagnosis not present

## 2021-08-15 NOTE — Therapy (Signed)
[place], [place], [address] Phone: [phone]   Fax:  [phone]  Physical Therapy Treatment/Discharge  Patient Details  Name: Sydney [name] MRN: [id number] Date of Birth: 1966-05-28 Referring Provider (PT): Lyndal Pulley, DO   Encounter Date: 08/14/2021   PT End of Session - 08/14/21 0742     Visit Number 12    Number of Visits 17    Date for PT Re-Evaluation 08/16/21    Authorization Type [place] [employer]    Progress Note Due on Visit 12    PT Start Time 0730   14 mins late for appt.   PT Stop Time 0805    PT Time Calculation (min) 35 min    Activity Tolerance Patient tolerated treatment well    Behavior During Therapy WFL for tasks assessed/performed             Past Medical History:  Diagnosis Date   Allergy    Anemia    Eczema    GERD (gastroesophageal reflux disease)    Hematuria    Hyperlipidemia    Hypertension    Kidney stone on left side    Lump in female breast    seen by Dr. Fleet Contras   Metabolic syndrome    Microscopic hematuria    Vitreous detachment of right eye 08/22/2019   Dr. George Ina,  [facility]    Past Surgical History:  Procedure Laterality Date   oral surgery  Left 10/2020   molar tooth implant    URETHRAL DILATION  Age 67    There were no vitals filed for this visit.   Subjective Assessment - 08/14/21 0734     Subjective My L foot/calf has been better. Some pain daily, but less. Approx 80% improvement for her R foot/ankle and 40% for the L knee. Pt has not been able to attend PT approx 1 month related to covid. Pt reports the rest did decrease her frquency of pain.    Patient Stated Goals To return to dancing and to start playing tennis    Currently in Pain? Yes    Pain Score 0-No pain   range 0-7.   Pain Location Ankle    Pain Orientation Medial;Lateral;Right    Pain Descriptors / Indicators Aching    Pain Type Chronic pain    Pain  Onset More than a month ago    Pain Frequency Intermittent    Aggravating Factors  walking    Pain Relieving Factors RICe and brace    Pain Score 0    Pain Location Knee    Pain Orientation Left    Pain Descriptors / Indicators Aching    Pain Type Chronic pain    Pain Onset More than a month ago    Pain Frequency Occasional    Aggravating Factors  Prolonged standing and walking    Pain Relieving Factors rest                [facility] PT Assessment - 08/15/21 0001       Observation/Other Assessments   Focus on Therapeutic Outcomes (FOTO)  69% R ankle;  69% L knee      AROM   Right Ankle Dorsiflexion 10    Left Ankle Dorsiflexion 10      Strength   Right Hip Flexion 4+/5    Right Hip ABduction 4+/5    Left Hip Flexion 4/5    Left Hip ABduction 4/5  [facility] Adult PT Treatment/Exercise - 08/15/21 0001       Knee/Hip Exercises: Seated   Long Arc Quad Right;Left;2 sets;10 reps    Long Arc Quad Weight 4 lbs.    Sit to Sand 2 sets;10 reps   verbal cueing to avoid medial knee collapse     Knee/Hip Exercises: Supine   Bridges Both;2 sets;10 reps    Straight Leg Raises Right;Left;2 sets;10 reps    Straight Leg Raises Limitations 4#      Knee/Hip Exercises: Sidelying   Hip ABduction Right;Left;2 sets;10 reps    Hip ABduction Limitations 4#    Clams L and R, 2x10, GTB                     PT Education - 08/15/21 0555     Education Details Updated for final HEP. Recommended use of cuff weights for strengthening with HEP. Reviewed results of FOTO    Person(s) Educated Patient    Methods Explanation;Demonstration;Handout    Comprehension Verbalized understanding;Returned demonstration              PT Short Term Goals - 06/26/21 0726       PT SHORT TERM GOAL #1   Title Pt will be Ind in an initial HEP    Baseline started on eval; 06/26/21: independent with HEP    Period Weeks    Status Achieved    Target Date  07/03/21      PT SHORT TERM GOAL #2   Title Pt will voice understanding of measures to assist in pain reduction    Baseline 06/26/21: pt verbalizes ice, compression and elevation when she exacerbates her pain.    Period Weeks    Status Achieved    Target Date 07/03/21               PT Long Term Goals - 08/15/21 0600       PT LONG TERM GOAL #1   Title Improve pt' bilat ankle AROM for DF to 10d to reduce achilles strain. 08/14/21: 10d bilat    Baseline Bilat 0d    Status Achieved    Target Date 08/14/21      PT LONG TERM GOAL #2   Title Pt will be able to line dance for 30 mins without development of R achilles pain. 08/14/21: pt has not attempted, but she is walking greater length of time with less pain    Status Deferred    Target Date 08/14/21      PT LONG TERM GOAL #3   Title Pt FOTO score fo funtional ability will improve to 67% for the R achilles,and 74% for the L knee: 08/14/21: R achilles 69%, L knee 69%    Baseline R acilles 50%, L knee 65%    Status Partially Met    Target Date 08/14/21      PT LONG TERM GOAL #4   Title Pt will be Ind in a final HEP to maintain achieved LOF    Status Achieved    Target Date 08/16/21      PT LONG TERM GOAL #5   Title Improve Bilat hip strength to 4+/5 for support of her ankles. 08/14/21: R hip 4+/5, L hip 4/5    Status Partially Met    Target Date 08/14/21                   Plan - 08/14/21 0743     Clinical Impression Statement Pt presents for  last PT appt. Pt has made progress re: decreased pain and improved tolerance to activity. Pt demonstrates appropriate understanding of measures to assit in the reduction of pain and swelling when they occur. the mjority of set goals have been met. Pt notes she needs to be more consistent with her HEP and understands strengthening her L LE which continues to present with weakness will be helpful with her L knee pain. Pt demonstrates appropriate understanding of her HEP.     Personal Factors and Comorbidities Past/Current Experience;Time since onset of injury/illness/exacerbation;Comorbidity 2    Comorbidities High BMI. HTN    Examination-Activity Limitations Squat;Stand;Locomotion Level    Stability/Clinical Decision Making Stable/Uncomplicated    Clinical Decision Making Low    Rehab Potential Good    PT Frequency 2x / week    PT Duration 8 weeks    PT Treatment/Interventions ADLs/Self Care Home Management;Aquatic Therapy;Electrical Stimulation;Cryotherapy;Iontophoresis 4mg /ml Dexamethasone;Moist Heat;Ultrasound;Balance training;Therapeutic exercise;Therapeutic activities;Functional mobility training;Stair training;Gait training;Dry needling;Passive range of motion;Taping    PT Next Visit Plan re-check goals, AROM, MMT- pt wants to focus left knee strength    PT Home Exercise Plan [phone]    Consulted and Agree with Plan of Care Patient             Patient will benefit from skilled therapeutic intervention in order to improve the following deficits and impairments:  Difficulty walking, Decreased activity tolerance, Impaired flexibility, Decreased range of motion, Pain  Visit Diagnosis: Achilles tendinitis of right lower extremity  Decreased range of motion of right ankle  Patellofemoral syndrome of left knee  Chronic pain of right ankle     Problem List Patient Active Problem List   Diagnosis Date Noted   Achilles tendinitis of right lower extremity 05/07/2021   Patellofemoral syndrome of left knee 05/07/2021   Low back pain with sciatica 02/24/2015   Dyslipidemia 02/24/2015   Dermatitis, eczematoid 02/24/2015   Acid reflux 02/24/2015   Benign hypertension 02/24/2015   Iron deficiency 65/46/5035   Dysmetabolic syndrome 46/56/8127   Obesity (BMI 30.0-34.9) 02/24/2015   Allergic rhinitis 02/24/2015   Hematuria, microscopic 06/04/2010   PHYSICAL THERAPY DISCHARGE SUMMARY  Visits from Start of Care: 12  Current functional level related  to goals / functional outcomes: See above   Remaining deficits: See above   Education / Equipment: HEP    Patient agrees to discharge. Patient goals were partially met. Patient is being discharged due to being pleased with the current functional level.  Gar Ponto MS, PT 08/15/21 6:18 AM   [facility] [address] [place], [place], [address] Phone: [phone]   Fax:  [phone]  Name: Sydney [name] MRN: [id number] Date of Birth: 1966-08-20

## 2021-08-27 NOTE — Progress Notes (Signed)
Name: Sydney [name]   MRN: [id number]    DOB: 10/26/1965   Date:08/28/2021       Progress Note  Subjective  Chief Complaint  Annual Exam  HPI  Patient presents for annual CPE.  Diet: reviewed a balanced diet  Exercise: stopped due to achilles tendon tear - under the care of sports medicine physician - [facility] Office Visit from 11/22/2019 in [facility]  AUDIT-C Score 0      Depression: Phq 9 is  negative Depression screen [facility] 2/9 08/28/2021 12/12/2020 11/22/2019 09/29/2018 06/21/2017  Decreased Interest 0 0 0 0 0  Down, Depressed, Hopeless 0 0 0 0 0  PHQ - 2 Score 0 0 0 0 0  Altered sleeping 0 - - 0 -  Tired, decreased energy 0 - - 1 -  Change in appetite 0 - - 0 -  Feeling bad or failure about yourself  0 - - 0 -  Trouble concentrating 0 - - 0 -  Moving slowly or fidgety/restless 0 - - 0 -  Suicidal thoughts 0 - - 0 -  PHQ-9 Score 0 - - 1 -  Difficult doing work/chores - - - Not difficult at all -   Hypertension: BP Readings from Last 3 Encounters:  08/28/21 128/82  07/21/21 136/82  06/05/21 (!) 142/90   Obesity: Wt Readings from Last 3 Encounters:  08/28/21 170 lb (77.1 kg)  07/21/21 175 lb (79.4 kg)  06/05/21 171 lb (77.6 kg)   BMI Readings from Last 3 Encounters:  08/28/21 30.11 kg/m  07/21/21 31.00 kg/m  06/05/21 30.29 kg/m     Vaccines:   Shingrix: 44-64 yo and ask insurance if covered when patient above 18 yo Pneumonia: educated and discussed with patient. Flu: educated and discussed with patient. COVID-19: advised to have bivalent booster   Hep C Screening: 09/29/18 STD testing and prevention (HIV/chl/gon/syphilis): 09/29/18 Intimate partner violence: negative Sexual History : never sexually active  Menstrual History/LMP/Abnormal Bleeding: discussed post-menopausal bleeding  Incontinence Symptoms: no problems   Breast cancer:  - Last Mammogram: 01/07/21 - BRCA gene screening: N/A  Osteoporosis  prevention  Discussed high calcium and vitamin D supplementation, weight bearing exercises  Cervical cancer screening: Ordered today  Skin cancer: Discussed monitoring for atypical lesions  Colorectal cancer: N/A   Lung cancer: Low Dose CT Chest recommended if Age 14-80 years, 20 pack-year currently smoking OR have quit w/in 15years. Patient does not qualify.   ECG: 09/29/18  Advanced Care Planning: A voluntary discussion about advance care planning including the explanation and discussion of advance directives.  Discussed health care proxy and Living will, and the patient was able to identify a health care proxy as sister  Patient does not have a living will at present time. If patient does have living will, I have requested they bring this to the clinic to be scanned in to their chart.  Lipids: Lab Results  Component Value Date   CHOL 220 (H) 11/22/2019   CHOL 222 (H) 09/29/2018   CHOL 213 (H) 07/19/2017   Lab Results  Component Value Date   HDL 62 11/22/2019   HDL 54 09/29/2018   HDL 69 07/19/2017   Lab Results  Component Value Date   LDLCALC 143 (H) 11/22/2019   LDLCALC 145 (H) 09/29/2018   LDLCALC 130 (H) 07/19/2017   Lab Results  Component Value Date   TRIG 60 11/22/2019   TRIG 114 09/29/2018   TRIG 58 07/19/2017  Lab Results  Component Value Date   CHOLHDL 3.5 11/22/2019   CHOLHDL 4.1 09/29/2018   CHOLHDL 3.1 07/19/2017   No results found for: LDLDIRECT  Glucose: Glucose, Bld  Date Value Ref Range Status  11/22/2019 82 65 - 99 mg/dL Final    Comment:    .            Fasting reference interval .   09/29/2018 81 65 - 99 mg/dL Final    Comment:    .            Fasting reference interval .   07/19/2017 83 65 - 99 mg/dL Final    Comment:    .            Fasting reference interval .     Patient Active Problem List   Diagnosis Date Noted   Achilles tendinitis of right lower extremity 05/07/2021   Patellofemoral syndrome of left knee 05/07/2021    Low back pain with sciatica 02/24/2015   Dyslipidemia 02/24/2015   Dermatitis, eczematoid 02/24/2015   Acid reflux 02/24/2015   Benign hypertension 02/24/2015   Iron deficiency 37/85/8850   Dysmetabolic syndrome 27/74/1287   Obesity (BMI 30.0-34.9) 02/24/2015   Allergic rhinitis 02/24/2015   Hematuria, microscopic 06/04/2010    Past Surgical History:  Procedure Laterality Date   oral surgery  Left 10/2020   molar tooth implant    URETHRAL DILATION  Age 71    Family History  Problem Relation Age of Onset   Hypertension Mother    Hyperlipidemia Mother    Dementia Mother    Diabetes Father    CAD Father    Psoriasis Father    Heart disease Father    Cancer Father        Prostate and Multiple Myeloma   Asthma Sister    Thyroid disease Sister    Diabetes Sister    Breast cancer Neg Hx     Social History   Socioeconomic History   Marital status: Single    Spouse name: Not on file   Number of children: 0   Years of education: Not on file   Highest education level: Bachelor's degree (e.g., BA, AB, BS)  Occupational History    Employer:     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  Tobacco Use   Smoking status: Never   Smokeless tobacco: Never  Vaping Use   Vaping Use: Never used  Substance and Sexual Activity   Alcohol use: No    Alcohol/week: 0.0 standard drinks   Drug use: No   Sexual activity: Not Currently  Other Topics Concern   Not on file  Social History Narrative   Works for [employer], lives alone   She has one Neurosurgeon   Mother is in memory care now    Social Determinants of Health   Financial Resource Strain: Low Risk    Difficulty of Paying Living Expenses: Not hard at all  Food Insecurity: No Food Insecurity   Worried About Charity fundraiser in the Last Year: Never true   Arboriculturist in the Last Year: Never true  Transportation Needs: No Transportation Needs   Lack of Transportation (Medical): No   Lack of Transportation (Non-Medical):  No  Physical Activity: Inactive   Days of Exercise per Week: 0 days   Minutes of Exercise per Session: 0 min  Stress: No Stress Concern Present   Feeling of Stress : Only a little  Social Connections: Moderately Integrated  Frequency of Communication with Friends and Family: Three times a week   Frequency of Social Gatherings with Friends and Family: Once a week   Attends Religious Services: More than 4 times per year   Active Member of [employer] or Organizations: Yes   Attends Archivist Meetings: 1 to 4 times per year   Marital Status: Never married  Human resources officer Violence: Not At Risk   Fear of Current or Ex-Partner: No   Emotionally Abused: No   Physically Abused: No   Sexually Abused: No     Current Outpatient Medications:    Calcium-Phosphorus-Vitamin D (CITRACAL +D3 PO), Take 2 tablets by mouth daily., Disp: , Rfl:    Clindamycin-Benzoyl Per, Refr, gel, Apply 1 g topically 2 (two) times daily., Disp: 45 g, Rfl: 2   hydrochlorothiazide (MICROZIDE) 12.5 MG capsule, TAKE 1 CAPSULE BY MOUTH DAILY., Disp: 90 capsule, Rfl: 1   metoprolol succinate (TOPROL-XL) 25 MG 24 hr tablet, Take 0.5 tablets (12.5 mg total) by mouth daily., Disp: 45 tablet, Rfl: 1   Misc Natural Products (AIRBORNE ELDERBERRY) CHEW, Chew 2 each by mouth daily. Nature's Bounty Elderberry Gummies, Disp: , Rfl:    Misc Natural Products (LUTEIN 20 PO), Take 1 each by mouth daily., Disp: , Rfl:    Multiple Vitamins-Minerals (CENTRUM ADULTS PO), Take by mouth., Disp: , Rfl:    OVER THE COUNTER MEDICATION, Take 2 each by mouth 2 (two) times daily. Bio Complete 3, Disp: , Rfl:    desonide (DESOWEN) 0.05 % ointment, Apply twice daily to affected eyelids until clear, and then discontinue (Patient not taking: Reported on 08/28/2021), Disp: 15 g, Rfl: 0   ibuprofen (ADVIL,MOTRIN) 100 MG tablet, Take 100 mg by mouth 2 (two) times daily as needed for fever. (Patient not taking: Reported on 08/28/2021), Disp: , Rfl:     meloxicam (MOBIC) 15 MG tablet, Take 1 tablet (15 mg total) by mouth daily. (Patient not taking: Reported on 08/28/2021), Disp: 30 tablet, Rfl: 0   tretinoin (RETIN-A) 0.025 % cream, Apply a pea size amount to dry face nightly (Patient not taking: Reported on 08/28/2021), Disp: 20 g, Rfl: 3  Allergies  Allergen Reactions   Amoxicillin    Sulfa Antibiotics      ROS  Constitutional: Negative for fever or weight change.  Respiratory: Negative for cough and shortness of breath.   Cardiovascular: Negative for chest pain or palpitations.  Gastrointestinal: Negative for abdominal pain, no bowel changes.  Musculoskeletal: positive  for gait problem and right heel joint swelling.  Skin: Negative for rash.  Neurological: Negative for dizziness or headache.  No other specific complaints in a complete review of systems (except as listed in HPI above).   Objective  Vitals:   08/28/21 0818  BP: 128/82  Pulse: 82  Resp: 16  Temp: 98 F (36.7 C)  SpO2: 96%  Weight: 170 lb (77.1 kg)  Height: $Remove'5\' 3"'Lciffuv$  (1.6 m)    Body mass index is 30.11 kg/m.  Physical Exam  Constitutional: Patient appears well-developed and well-nourished. No distress.  HENT: Head: Normocephalic and atraumatic. Ears: B TMs ok, no erythema or effusion; Nose: Not done l. Mouth/Throat: not done  Eyes: Conjunctivae and EOM are normal. Pupils are equal, round, and reactive to light. No scleral icterus.  Neck: Normal range of motion. Neck supple. No JVD present. No thyromegaly present.  Cardiovascular: Normal rate, regular rhythm and normal heart sounds.  No murmur heard. No BLE edema. Pulmonary/Chest: Effort normal and breath sounds  normal. No respiratory distress. Abdominal: Soft. Bowel sounds are normal, no distension. There is no tenderness. no masses Breast: no lumps or masses, no nipple discharge or rashes FEMALE GENITALIA:  External genitalia normal External urethra normal Vaginal vault normal without discharge or  lesions Cervix normal without discharge or lesions Bimanual exam normal without masses RECTAL: not done  Musculoskeletal: Normal range of motion, no joint effusions. No gross deformities Neurological: he is alert and oriented to person, place, and time. No cranial nerve deficit. Coordination, balance, strength, speech and gait are normal.  Skin: Skin is warm and dry. No rash noted. No erythema.  Psychiatric: Patient has a normal mood and affect. behavior is normal. Judgment and thought content normal.    Fall Risk: Fall Risk  08/28/2021 12/12/2020 11/22/2019 09/29/2018 06/21/2017  Falls in the past year? 0 0 1 0 No  Comment - - Fell on [employer] - -  Number falls in past yr: 0 0 0 - -  Injury with Fall? 0 0 0 - -  Risk for fall due to : No Fall Risks - - - -  Follow up Falls prevention discussed - - - -     Functional Status Survey: Is the patient deaf or have difficulty hearing?: No Does the patient have difficulty seeing, even when wearing glasses/contacts?: No Does the patient have difficulty concentrating, remembering, or making decisions?: No Does the patient have difficulty walking or climbing stairs?: No Does the patient have difficulty dressing or bathing?: No Does the patient have difficulty doing errands alone such as visiting a doctor's office or shopping?: No   Assessment & Plan  1. Well adult exam  - Cytology - PAP - Varicella-zoster vaccine IM - Lipid panel - CBC with Differential/Platelet - COMPLETE METABOLIC PANEL WITH GFR - Hemoglobin A1c  2. Cervical cancer screening  - Cytology - PAP  3. Breast cancer screening by mammogram  - MM 3D SCREEN BREAST BILATERAL; Future  4. Need for shingles vaccine  - Varicella-zoster vaccine IM  5. Colon cancer screening  - Fecal Globin By Immunochemistry   -USPSTF grade A and B recommendations reviewed with patient; age-appropriate recommendations, preventive care, screening tests, etc discussed and encouraged; healthy  living encouraged; see AVS for patient education given to patient -Discussed importance of 150 minutes of physical activity weekly, eat two servings of fish weekly, eat one serving of tree nuts ( cashews, pistachios, pecans, almonds.Marland Kitchen) every other day, eat 6 servings of fruit/vegetables daily and drink plenty of water and avoid sweet beverages.

## 2021-08-28 ENCOUNTER — Encounter: Payer: Self-pay | Admitting: Family Medicine

## 2021-08-28 ENCOUNTER — Ambulatory Visit (INDEPENDENT_AMBULATORY_CARE_PROVIDER_SITE_OTHER): Payer: 59 | Admitting: Family Medicine

## 2021-08-28 ENCOUNTER — Other Ambulatory Visit (HOSPITAL_COMMUNITY)
Admission: RE | Admit: 2021-08-28 | Discharge: 2021-08-28 | Disposition: A | Discharge status: Home / Self Care | Payer: 59 | Source: Ambulatory Visit | Attending: Family Medicine | Admitting: Family Medicine

## 2021-08-28 VITALS — BP 128/82 | HR 82 | Temp 98.0°F | Resp 16 | Ht 63.0 in | Wt 170.0 lb

## 2021-08-28 DIAGNOSIS — Z1211 Encounter for screening for malignant neoplasm of colon: Secondary | ICD-10-CM

## 2021-08-28 DIAGNOSIS — Z0000 Encounter for general adult medical examination without abnormal findings: Secondary | ICD-10-CM | POA: Diagnosis not present

## 2021-08-28 DIAGNOSIS — Z23 Encounter for immunization: Secondary | ICD-10-CM

## 2021-08-28 DIAGNOSIS — Z1231 Encounter for screening mammogram for malignant neoplasm of breast: Secondary | ICD-10-CM

## 2021-08-28 DIAGNOSIS — Z124 Encounter for screening for malignant neoplasm of cervix: Secondary | ICD-10-CM

## 2021-08-28 NOTE — Patient Instructions (Signed)

## 2021-08-29 ENCOUNTER — Telehealth: Payer: Self-pay

## 2021-08-29 LAB — CYTOLOGY - PAP
Comment: NEGATIVE
Diagnosis: NEGATIVE
High risk HPV: NEGATIVE

## 2021-08-29 LAB — CBC WITH DIFFERENTIAL/PLATELET
Absolute Monocytes: 485 cells/uL (ref 200–950)
Basophils Absolute: 29 cells/uL (ref 0–200)
Basophils Relative: 0.6 %
Eosinophils Absolute: 283 cells/uL (ref 15–500)
Eosinophils Relative: 5.9 %
HCT: 38.3 % (ref 35.0–45.0)
Hemoglobin: 12.5 g/dL (ref 11.7–15.5)
Lymphs Abs: 835 cells/uL — ABNORMAL LOW (ref 850–3900)
MCH: 29.9 pg (ref 27.0–33.0)
MCHC: 32.6 g/dL (ref 32.0–36.0)
MCV: 91.6 fL (ref 80.0–100.0)
MPV: 12.1 fL (ref 7.5–12.5)
Monocytes Relative: 10.1 %
Neutro Abs: 3168 cells/uL (ref 1500–7800)
Neutrophils Relative %: 66 %
Platelets: 224 10*3/uL (ref 140–400)
RBC: 4.18 10*6/uL (ref 3.80–5.10)
RDW: 12.5 % (ref 11.0–15.0)
Total Lymphocyte: 17.4 %
WBC: 4.8 10*3/uL (ref 3.8–10.8)

## 2021-08-29 LAB — HEMOGLOBIN A1C
Hgb A1c MFr Bld: 5.7 % of total Hgb — ABNORMAL HIGH (ref <5.7)
Mean Plasma Glucose: 117 mg/dL
eAG (mmol/L): 6.5 mmol/L

## 2021-08-29 LAB — COMPLETE METABOLIC PANEL WITH GFR
AG Ratio: 1.6 (calc) (ref 1.0–2.5)
ALT: 22 U/L (ref 6–29)
AST: 24 U/L (ref 10–35)
Albumin: 4.2 g/dL (ref 3.6–5.1)
Alkaline phosphatase (APISO): 62 U/L (ref 37–153)
BUN: 21 mg/dL (ref 7–25)
CO2: 29 mmol/L (ref 20–32)
Calcium: 9.5 mg/dL (ref 8.6–10.4)
Chloride: 101 mmol/L (ref 98–110)
Creat: 0.79 mg/dL (ref 0.50–1.03)
Globulin: 2.6 g/dL (calc) (ref 1.9–3.7)
Glucose, Bld: 82 mg/dL (ref 65–99)
Potassium: 4.2 mmol/L (ref 3.5–5.3)
Sodium: 139 mmol/L (ref 135–146)
Total Bilirubin: 0.4 mg/dL (ref 0.2–1.2)
Total Protein: 6.8 g/dL (ref 6.1–8.1)
eGFR: 88 mL/min/{1.73_m2} (ref >=60)

## 2021-08-29 LAB — LIPID PANEL
Cholesterol: 248 mg/dL — ABNORMAL HIGH (ref <200)
HDL: 70 mg/dL (ref >=50)
LDL Cholesterol (Calc): 161 mg/dL (calc) — ABNORMAL HIGH
Non-HDL Cholesterol (Calc): 178 mg/dL (calc) — ABNORMAL HIGH (ref <130)
Total CHOL/HDL Ratio: 3.5 (calc) (ref <5.0)
Triglycerides: 70 mg/dL (ref <150)

## 2021-08-29 NOTE — Telephone Encounter (Signed)
Pt given results per notes of Dr. Ancil Boozer on 08/29/21. Pt verbalized understanding. Pt is asking if labs and office visit notes can be mailed to her. Advised I will send it over to Dr. Ancil Boozer.

## 2021-09-02 NOTE — Telephone Encounter (Signed)
Printed and placed in the mail for pick up today, 09/02/2021.

## 2021-09-08 NOTE — Progress Notes (Signed)
[place] Phone: [phone] Subjective:   Sydney [name], am serving as a scribe for Dr. Hulan [name]. This visit occurred during the SARS-CoV-2 public health emergency.  Safety protocols were in place, including screening questions prior to the visit, additional usage of staff PPE, and extensive cleaning of exam room while observing appropriate contact time as indicated for disinfecting solutions.  I'm seeing this patient by the request  of:  Sydney Sizer, MD  CC: right ankle, left knee pain   [id number]  07/21/2021 Continued improvement noted.  Still though on ultrasound does have increasing neovascularization in the tendon itself.  Concern still for intersubstance tearing.  Significant improvement noted in the calcific changes that was noted.  Patient wants to continue with the conservative therapy and will decrease frequency of physical therapy to 1 time a week over the next 6 weeks.  Discussed and see patient again in 6 weeks to discuss further treatment options if necessary.  Update 09/10/2021 Sydney [name] is a 56 y.o. female coming in with complaint of R achilles tendonitis and L knee pain. Patient states that she has feel improvement in foot but not in L knee pain. Patient does like to do water aerobics and would like to get back to it. Pain in knee is worse with standing and walking. Notices pain in knee daily. Has not been doing HEP.       Past Medical History:  Diagnosis Date   Allergy    Anemia    Eczema    GERD (gastroesophageal reflux disease)    Hematuria    Hyperlipidemia    Hypertension    Kidney stone on left side    Lump in female breast    seen by Dr. Fleet [name]   Metabolic syndrome    Microscopic hematuria    Vitreous detachment of right eye 08/22/2019   Dr. 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  Tobacco Use   Smoking status: Never   Smokeless tobacco: Never  Vaping Use   Vaping Use: Never used  Substance and Sexual Activity   Alcohol use: [name]    Alcohol/week: 0.0 standard drinks   Drug use: [name]   Sexual activity: Not Currently  Other Topics Concern   Not on file  Social History Narrative   Works for [employer], lives alone   She has one Neurosurgeon   Mother is in memory care now    Social Determinants of Health   Financial Resource Strain: Low Risk    Difficulty of Paying Living Expenses: Not hard at all  Food Insecurity: [name] Food Insecurity   Worried About Charity fundraiser in the Last Year: Never true   Arboriculturist in the Last Year: Never true  Transportation Needs: [name] Transportation Needs   Lack of Transportation (Medical): [name]   Lack of Transportation (Non-Medical): [name]  Physical Activity: Inactive   Days of Exercise  per Week: 0 days   Minutes of Exercise per Session: 0 min  Stress: [name] Stress Concern Present   Feeling of Stress : Only a little  Social Connections: Moderately Integrated   Frequency of Communication with Friends and Family: Three times a week   Frequency of Social Gatherings with Friends and Family: Once a week   Attends Religious Services: More than 4 times per year   Active Member of [employer] or Organizations: Yes   Attends Music therapist: 1 to 4 times per year   Marital Status: Never married   Allergies  Allergen Reactions   Amoxicillin    Sulfa Antibiotics    Family History  Problem Relation Age of Onset   Hypertension Mother    Hyperlipidemia Mother    Dementia Mother     Diabetes Father    CAD Father    Psoriasis Father    Heart disease Father    Cancer Father        Prostate and Multiple Myeloma   Asthma Sister    Thyroid disease Sister    Diabetes Sister    Breast cancer Neg Hx      Current Outpatient Medications (Cardiovascular):    hydrochlorothiazide (MICROZIDE) 12.5 MG capsule, TAKE 1 CAPSULE BY MOUTH DAILY.   metoprolol succinate (TOPROL-XL) 25 MG 24 hr tablet, Take 0.5 tablets (12.5 mg total) by mouth daily.     Current Outpatient Medications (Other):    Calcium-Phosphorus-Vitamin D (CITRACAL +D3 PO), Take 2 tablets by mouth daily.   Clindamycin-Benzoyl Per, Refr, gel, Apply 1 g topically 2 (two) times daily.   Misc Natural Products (AIRBORNE ELDERBERRY) CHEW, Chew 2 each by mouth daily. Nature's Bounty Elderberry Gummies   Misc Natural Products (LUTEIN 20 PO), Take 1 each by mouth daily.   Multiple Vitamins-Minerals (CENTRUM ADULTS PO), Take by mouth.   OVER THE COUNTER MEDICATION, Take 2 each by mouth 2 (two) times daily. Bio Complete 3   Reviewed prior external information including notes and imaging from  primary care provider As well as notes that were available from care everywhere and other healthcare systems.  Past medical history, social, surgical and family history all reviewed in electronic medical record.  [name] pertanent information unless stated regarding to the chief complaint.   Review of Systems:  [name] headache, visual changes, nausea, vomiting, diarrhea, constipation, dizziness, abdominal pain, skin rash, fevers, chills, night sweats, weight loss, swollen lymph nodes, body aches, joint swelling, chest pain, shortness of breath, mood changes. POSITIVE muscle aches  Objective  Blood pressure 130/72, pulse 81, height 5' 3" (1.6 m), weight 174 lb (78.9 kg), last menstrual period 07/21/2015, SpO2 98 %.   General: [name] apparent distress alert and oriented x3 mood and affect normal, dressed appropriately.  HEENT: Pupils equal,  extraocular movements intact  Respiratory: Patient's speak in full sentences and does not appear short of breath  Cardiovascular: [name] lower extremity edema, non tender, [name] erythema  Gait normal with good balance and coordination.  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  Significant decrease Swelling noted.  Patient is very minimally tender to palpation today. Left knee exam still shows some lateral tracking of the patella noted.  Patient's knee though otherwise has fairly good stability.   Limited muscular skeletal ultrasound was performed and interpreted by Sydney [name], M  Limited ultrasound of the Achilles shows significant decrease in the calcific changes that was noted previously.  Patient does still have some neovascularization that is  within the tendon itself.  Consistent with more of a interstitial tearing.  [name] significant retraction noted. Impression: Interval improvement but continued interstitial tearing of the Achilles tendon.   Impression and Recommendations:     The above documentation has been reviewed and is accurate and complete Lyndal Pulley, DO

## 2021-09-10 ENCOUNTER — Other Ambulatory Visit: Payer: Self-pay

## 2021-09-10 ENCOUNTER — Ambulatory Visit: Payer: 59 | Admitting: Family Medicine

## 2021-09-10 ENCOUNTER — Encounter: Payer: Self-pay | Admitting: Family Medicine

## 2021-09-10 ENCOUNTER — Ambulatory Visit: Payer: Self-pay

## 2021-09-10 VITALS — BP 130/72 | HR 81 | Ht 63.0 in | Wt 174.0 lb

## 2021-09-10 DIAGNOSIS — M7661 Achilles tendinitis, right leg: Secondary | ICD-10-CM

## 2021-09-10 DIAGNOSIS — M222X2 Patellofemoral disorders, left knee: Secondary | ICD-10-CM | POA: Diagnosis not present

## 2021-09-10 DIAGNOSIS — M79671 Pain in right foot: Secondary | ICD-10-CM | POA: Diagnosis not present

## 2021-09-10 NOTE — Assessment & Plan Note (Signed)
Significant improvement in the calcific changes that was noted previously.  Still has little increase in neovascularization that is concerning for the possibility of interstitial tearing noted.  Patient will has done well and is making progress.  Does not want to change anything significantly.  We will continue to wear the brace.  Discussed that this could take another 2 months to heal appropriately.  Patient will continue to try to do the home exercises more regularly and we will see patient back again at that time

## 2021-09-10 NOTE — Assessment & Plan Note (Signed)
Still having some difficulty but not being quite as active and has been noncompliant with home exercises.  We discussed with patient to start that on a more regular basis.  Patient declined formal physical therapy or any advanced imaging.  Patient does not have any locking.  We will try to increase activity and see how patient responds.

## 2021-09-10 NOTE — Patient Instructions (Addendum)
Get back to water aerobics Try to do exercises 3x a week Continue Vit D Continue bracing Voltaren gel Ice 20 min 2x a day See me in 6 weeks

## 2021-10-01 MED FILL — Hydrochlorothiazide Cap 12.5 MG: ORAL | 90 days supply | Qty: 90 | Fill #1 | Status: AC

## 2021-10-01 MED FILL — Metoprolol Succinate Tab ER 24HR 25 MG (Tartrate Equiv): ORAL | 90 days supply | Qty: 45 | Fill #1 | Status: AC

## 2021-10-02 ENCOUNTER — Other Ambulatory Visit: Payer: Self-pay

## 2021-10-21 NOTE — Progress Notes (Signed)
Sydney [name] [address] [place] Phone: [phone] Subjective:   IVilma Meckel, am serving as a scribe for Dr. Hulan Saas. This visit occurred during the SARS-CoV-2 public health emergency.  Safety protocols were in place, including screening questions prior to the visit, additional usage of staff PPE, and extensive cleaning of exam room while observing appropriate contact time as indicated for disinfecting solutions.   I'm seeing this patient by the request  of:  Steele Sizer, MD  CC: Right ankle follow-up  [id number]  09/10/2021 Significant improvement in the calcific changes that was noted previously.  Still has little increase in neovascularization that is concerning for the possibility of interstitial tearing noted.  Patient will has done well and is making progress.  Does not want to change anything significantly.  We will continue to wear the brace.  Discussed that this could take another 2 months to heal appropriately.  Patient will continue to try to do the home exercises more regularly and we will see patient back again at that time  Still having some difficulty but not being quite as active and has been noncompliant with home exercises.  We discussed with patient to start that on a more regular basis.  Patient declined formal physical therapy or any advanced imaging.  Patient does not have any locking.  We will try to increase activity and see how patient responds.  Update 10/22/2021 Sydney [name] is a 56 y.o. female coming in with complaint of R achilles and L knee pain. Patient states progressively getting better, but still a little uncomfortable. Planning on going back to pool. Left knee pain is about the same. Hasn't been consistent with exercises. Felt a catching sensation in left ankle anterior side, this is sporadic. No other complaints.      Past Medical History:  Diagnosis Date   Allergy    Anemia     Eczema    GERD (gastroesophageal reflux disease)    Hematuria    Hyperlipidemia    Hypertension    Kidney stone on left side    Lump in female breast    seen by Dr. Fleet Contras   Metabolic syndrome    Microscopic hematuria    Vitreous detachment of right eye 08/22/2019   Dr. 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  Tobacco Use   Smoking status: Never   Smokeless tobacco: Never  Vaping Use   Vaping Use: Never used  Substance and Sexual Activity   Alcohol use: No    Alcohol/week: 0.0 standard drinks   Drug use: No   Sexual activity: Not Currently  Other Topics Concern   Not on file  Social History Narrative   Works for [employer], lives alone   She has one cat   Mother is in memory care now    Social Determinants of Health   Financial Resource Strain: Low Risk    Difficulty of Paying Living Expenses: Not hard at  all  Food Insecurity: No Food Insecurity   Worried About Charity fundraiser in the Last Year: Never true   Ran Out of Food in the Last Year: Never true  Transportation Needs: No Transportation Needs   Lack of Transportation (Medical): No   Lack of Transportation (Non-Medical): No  Physical Activity: Inactive   Days of Exercise per Week: 0 days   Minutes of Exercise per Session: 0 min  Stress: No Stress Concern Present   Feeling of Stress : Only a little  Social Connections: Moderately Integrated   Frequency of Communication with Friends and Family: Three times a week   Frequency of Social Gatherings with Friends and Family: Once a week    Attends Religious Services: More than 4 times per year   Active Member of [employer] or Organizations: Yes   Attends Music therapist: 1 to 4 times per year   Marital Status: Never married   Allergies  Allergen Reactions   Amoxicillin    Sulfa Antibiotics    Family History  Problem Relation Age of Onset   Hypertension Mother    Hyperlipidemia Mother    Dementia Mother    Diabetes Father    CAD Father    Psoriasis Father    Heart disease Father    Cancer Father        Prostate and Multiple Myeloma   Asthma Sister    Thyroid disease Sister    Diabetes Sister    Breast cancer Neg Hx      Current Outpatient Medications (Cardiovascular):    hydrochlorothiazide (MICROZIDE) 12.5 MG capsule, TAKE 1 CAPSULE BY MOUTH DAILY.   metoprolol succinate (TOPROL-XL) 25 MG 24 hr tablet, Take 0.5 tablets (12.5 mg total) by mouth daily.     Current Outpatient Medications (Other):    Calcium-Phosphorus-Vitamin D (CITRACAL +D3 PO), Take 2 tablets by mouth daily.   Clindamycin-Benzoyl Per, Refr, gel, Apply 1 g topically 2 (two) times daily.   Misc Natural Products (AIRBORNE ELDERBERRY) CHEW, Chew 2 each by mouth daily. Nature's Bounty Elderberry Gummies   Misc Natural Products (LUTEIN 20 PO), Take 1 each by mouth daily.   Multiple Vitamins-Minerals (CENTRUM ADULTS PO), Take by mouth.   OVER THE COUNTER MEDICATION, Take 2 each by mouth 2 (two) times daily. Bio Complete 3   Reviewed prior external information including notes and imaging from  primary care provider As well as notes that were available from care everywhere and other healthcare systems.  Past medical history, social, surgical and family history all reviewed in electronic medical record.  No pertanent information unless stated regarding to the chief complaint.   Review of Systems:  No headache, visual changes, nausea, vomiting, diarrhea, constipation, dizziness, abdominal pain, skin rash, fevers, chills, night sweats,  weight loss, swollen lymph nodes, body aches, joint swelling, chest pain, shortness of breath, mood changes. POSITIVE muscle aches  Objective  Blood pressure 124/82, pulse 85, height $RemoveBe'5\' 3"'[id number]$  (1.6 m), weight 176 lb (79.8 kg), last menstrual period 07/21/2015, SpO2 98 %.   General: No apparent distress alert and oriented x3 mood and affect normal, dressed appropriately.  HEENT: Pupils equal, extraocular movements intact  Respiratory: Patient's speak in full sentences and does not appear short of breath  Cardiovascular: No lower extremity edema, non tender, no erythema  Right ankle exam shows the patient still has tightness noted of the posterior cord.  The patient does have tenderness to palpation still in the posterior cord.  Patient does have improvement  in the erythema and swelling that has been noted previously.  Still have Haglund nodule noted.  Limited muscular skeletal ultrasound was performed and interpreted by Hulan Saas, M  Limited ultrasound still shows a posterior heel spur noted, patient does still have calcific changes noted within the Achilles tendon.  Patient still has increasing neovascularization in Doppler flow Impression: Continued calcific Achilles tendinosis    Impression and Recommendations:     The above documentation has been reviewed and is accurate and complete Lyndal Pulley, DO

## 2021-10-22 ENCOUNTER — Ambulatory Visit: Payer: Self-pay

## 2021-10-22 ENCOUNTER — Encounter: Payer: Self-pay | Admitting: Family Medicine

## 2021-10-22 ENCOUNTER — Ambulatory Visit: Payer: 59 | Admitting: Family Medicine

## 2021-10-22 ENCOUNTER — Ambulatory Visit (INDEPENDENT_AMBULATORY_CARE_PROVIDER_SITE_OTHER): Payer: 59

## 2021-10-22 ENCOUNTER — Other Ambulatory Visit: Payer: Self-pay

## 2021-10-22 VITALS — BP 124/82 | HR 85 | Ht 63.0 in | Wt 176.0 lb

## 2021-10-22 DIAGNOSIS — M222X2 Patellofemoral disorders, left knee: Secondary | ICD-10-CM | POA: Diagnosis not present

## 2021-10-22 DIAGNOSIS — M7661 Achilles tendinitis, right leg: Secondary | ICD-10-CM | POA: Diagnosis not present

## 2021-10-22 DIAGNOSIS — M1712 Unilateral primary osteoarthritis, left knee: Secondary | ICD-10-CM | POA: Diagnosis not present

## 2021-10-22 NOTE — Patient Instructions (Addendum)
Xray today Looking good Soundwave therapy [email or web address] Try to do the exercises or we may have to talk about needles See you again in 6-8 weeks

## 2021-10-22 NOTE — Assessment & Plan Note (Signed)
Patient has made some improvement but continues to have some discomfort and pain in the area.  On ultrasound continues to have calcific changes as well.  I do feel that we may need to consider the possibility of soundwave therapy and will refer.  Hopefully this will be beneficial.  Discussed continuing vitamin D supplementation at the moment.  Patient will follow-up with me again in 6 to 8 weeks to make sure improved from that time.  May need to discuss discontinuing the HCTZ the long run 2.

## 2021-10-29 ENCOUNTER — Ambulatory Visit (INDEPENDENT_AMBULATORY_CARE_PROVIDER_SITE_OTHER): Payer: Self-pay | Admitting: Sports Medicine

## 2021-10-29 DIAGNOSIS — M7661 Achilles tendinitis, right leg: Secondary | ICD-10-CM

## 2021-10-29 NOTE — Progress Notes (Signed)
? ?  Subjective:  ? ? Patient ID: Sydney [name], female    DOB: June 15, 1966, 56 y.o.   MRN: [id number] ? ?HPI chief complaint: Right Achilles pain ? ?Sydney [name] is a very pleasant 56 year old female that comes in today complaining of approximately 1 year of right Achilles pain.  No trauma that she can recall.  She was referred by Dr. Tamala Julian to [place] today for consideration of soundwave treatment given the chronicity of her symptoms.  She has tried exercises, physical therapy, icing, heel lifts, and a brace but despite this her symptoms persist.  Most of her pain is in the distal Achilles around the calcaneus. ? ?Past medical history reviewed ?Medications reviewed ?Allergies reviewed ? ? ? ?Review of Systems ?As above ?   ?Objective:  ? Physical Exam ? ?Well-developed, well-nourished.  No acute distress ? 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  There is slight tenderness to palpation here.  No soft tissue swelling.  Achilles tendon is intact.  Good pulses. ? ? ?   ?Assessment & Plan:  ? ?Chronic Achilles tendinopathy, right foot ? ?Patient's first soundwave treatment was performed as below.  She tolerated this without difficulty.  We will plan on weekly sessions for the next 4 weeks. ? ?Procedure: ECSWT ?Indications: Chronic Achilles tendinopathy, right foot ?  ?Procedure Details ?Consent: Risks of procedure as well as the alternatives and risks of each were explained to the patient.  Written consent for procedure obtained. ?Time Out: Verified patient identification, verified procedure, site was marked, verified correct patient position, medications/allergies/relevent history reviewed.  The area was cleaned with alcohol swab.   ?  ?The right Achilles tendon was targeted for Extracorporeal shockwave therapy.  ?  ?Preset: Achilles pain ?Power Level: 90 ?Frequency: 10 ?Impulse/cycles: 2000 ?Head size: Regular ?  ?Patient tolerated procedure well without immediate complications ?  ? ?

## 2021-10-31 ENCOUNTER — Encounter: Payer: Self-pay | Admitting: Family Medicine

## 2021-11-06 ENCOUNTER — Ambulatory Visit (INDEPENDENT_AMBULATORY_CARE_PROVIDER_SITE_OTHER): Payer: Self-pay | Admitting: Sports Medicine

## 2021-11-06 DIAGNOSIS — M7661 Achilles tendinitis, right leg: Secondary | ICD-10-CM

## 2021-11-07 NOTE — Progress Notes (Signed)
Patient ID: Sydney [name], female   DOB: [birth year], 56 y.o.   MRN: [id number] ? ?Patient presents today for her second soundwave treatment for right heel Achilles tendinopathy.  She did well with the first treatment.  Overall she did notice some benefit although she did experience some mild discomfort throughout the medial and lateral ankle as well as the plantar aspect of the heel.  However, she was pleased enough with the results of the first treatment that she would like to go ahead and proceed today with her second treatment.  Treatment performed as below.  I was careful today to avoid the Haglund's deformity as it was uncomfortable during last week's treatment however we may want to focus a little treatment at this area again next week. ? ?Procedure: ECSWT ?Indications: Chronic right heel Achilles tendinopathy ?  ?Procedure Details ?Consent: Risks of procedure as well as the alternatives and risks of each were explained to the patient.  Written consent for procedure obtained. ?Time Out: Verified patient identification, verified procedure, site was marked, verified correct patient position, medications/allergies/relevent history reviewed.  The area was cleaned with alcohol swab.   ?  ?The right Achilles tendon was targeted for Extracorporeal shockwave therapy.  ?  ?Preset: Achillodynia ?Power Level: 90 ?Frequency: 10 ?Impulse/cycles: 2000 ?Head size: Normal ?  ?Patient tolerated procedure well without immediate complications ?  ? ?

## 2021-11-13 ENCOUNTER — Ambulatory Visit (INDEPENDENT_AMBULATORY_CARE_PROVIDER_SITE_OTHER): Payer: Self-pay | Admitting: Sports Medicine

## 2021-11-13 DIAGNOSIS — M7661 Achilles tendinitis, right leg: Secondary | ICD-10-CM

## 2021-11-13 NOTE — Progress Notes (Signed)
Patient ID: ALVITA FANA, female   DOB: 1966-01-18, 56 y.o.   MRN: [id number] ? ?Sydney [name] presents today for her third soundwave treatment of her right heel Achilles tendinopathy.  She continues to note improvement.  Third treatment performed as below.  Although I had considered focusing some of the treatment closer to her Haglund's deformity we decided to avoid that today given the pain that she had had with treatment in this area previously and her overall improvement with where we focused her treatment last week.  She will follow-up next week for her fourth treatment after which we will wait until her follow-up visit with Dr. Tamala Julian to determine whether or not further treatment is needed. ? ?Procedure: ECSWT ?Indications: Chronic right heel Achilles tendinopathy ?  ?Procedure Details ?Consent: Risks of procedure as well as the alternatives and risks of each were explained to the patient.  Written consent for procedure obtained. ?Time Out: Verified patient identification, verified procedure, site was marked, verified correct patient position, medications/allergies/relevent history reviewed.  The area was cleaned with alcohol swab.   ?  ?The right Achilles tendon was targeted for Extracorporeal shockwave therapy.  ?  ?Preset: Achillodynia ?Power Level: 90 ?Frequency: 10 ?Impulse/cycles: 2000 ?Head size: Regular ?  ?Patient tolerated procedure well without immediate complications ?  ? ?

## 2021-11-20 ENCOUNTER — Ambulatory Visit: Payer: 59 | Admitting: Sports Medicine

## 2021-12-02 NOTE — Progress Notes (Signed)
?Charlann Boxer D.O. ?[facility] ?[facility] ?Phone: [phone] ?Subjective:   ?I, Sydney [name], am serving as a scribe for Dr. Hulan Saas. ? ?This visit occurred during the SARS-CoV-2 public health emergency.  Safety protocols were in place, including screening questions prior to the visit, additional usage of staff PPE, and extensive cleaning of exam room while observing appropriate contact time as indicated for disinfecting solutions.  ? ? ?I'm seeing this patient by the request  of:  Steele Sizer, MD ? ?CC: Right ankle pain follow-up ? ?[id number]  ?10/22/2021 ?Patient has made some improvement but continues to have some discomfort and pain in the area.  On ultrasound continues to have calcific changes as well.  I do feel that we may need to consider the possibility of soundwave therapy and will refer.  Hopefully this will be beneficial.  Discussed continuing vitamin D supplementation at the moment.  Patient will follow-up with me again in 6 to 8 weeks to make sure improved from that time.  May need to discuss discontinuing the HCTZ the long run 2. ? ?Update 12/03/2021 ?Sydney [name] is a 56 y.o. female coming in with complaint of R achilles tendon pain. Patient has been going to soundwave therapy. Patient states that soundwave therapy had helped. Has started back with water aerobics. Modifies activities when she has pain. Pain is dull and achy but overall is much better. Continues to use AirHeel brace.  ? ? ? ?  ? ?Past Medical History:  ?Diagnosis Date  ? Allergy   ? Anemia   ? Eczema   ? GERD (gastroesophageal reflux disease)   ? Hematuria   ? Hyperlipidemia   ? Hypertension   ? Kidney stone on left side   ? Lump in female breast   ? seen by Dr. Fleet Contras  ? Metabolic syndrome   ? Microscopic hematuria   ? Vitreous detachment of right eye 08/22/2019  ? Dr. George Ina,  [facility]  ? ?Past Surgical History:  ?Procedure Laterality Date  ? oral surgery   Left 10/2020  ? molar tooth implant   ? URETHRAL DILATION  Age 30  ? ?Social History  ? ?Socioeconomic History  ? Marital status: Single  ?  Spouse name: Not on file  ? Number of children: 0  ? Years of education: Not on file  ? Highest education level: Bachelor's degree (e.g., BA, AB, BS)  ?Occupational History  ?  Employer:   ?  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 Smoking status: Never  ? Smokeless tobacco: Never  ?Vaping Use  ? Vaping Use: Never used  ?Substance and Sexual Activity  ? Alcohol use: No  ?  Alcohol/week: 0.0 standard drinks  ? Drug use: No  ? Sexual activity: Not Currently  ?Other Topics Concern  ? Not on file  ?Social History Narrative  ? Works for [employer], lives alone  ? She has one cat  ? Mother is in memory care now   ? ?Social Determinants of Health  ? ?Financial Resource Strain: Low Risk   ? Difficulty of Paying Living Expenses: Not hard at all  ?Food Insecurity: No Food Insecurity  ? Worried About Charity fundraiser in the Last Year: Never true  ? Ran Out of Food in the Last Year: Never true  ?Transportation Needs: No Transportation Needs  ? Lack of Transportation (Medical): No  ? Lack of Transportation (Non-Medical): No  ?Physical Activity: Inactive  ?  Days of Exercise per Week: 0 days  ? Minutes of Exercise per Session: 0 min  ?Stress: No Stress Concern Present  ? Feeling of Stress : Only a little  ?Social Connections: Moderately Integrated  ? Frequency of Communication with Friends and Family: Three times a week  ? Frequency of Social Gatherings with Friends and Family: Once a week  ? Attends Religious Services: More than 4 times per year  ? Active Member of Clubs or Organizations: Yes  ? Attends Archivist Meetings: 1 to 4 times per year  ? Marital Status: Never married  ? ?Allergies  ?Allergen Reactions  ? Amoxicillin   ? Sulfa Antibiotics   ? ?Family History  ?Problem Relation Age of Onset  ? Hypertension Mother   ? Hyperlipidemia Mother   ? Dementia  Mother   ? Diabetes Father   ? CAD Father   ? Psoriasis Father   ? Heart disease Father   ? Cancer Father   ?     Prostate and Multiple Myeloma  ? Asthma Sister   ? Thyroid disease Sister   ? Diabetes Sister   ? Breast cancer Neg Hx   ? ? ? ?Current Outpatient Medications (Cardiovascular):  ?  hydrochlorothiazide (MICROZIDE) 12.5 MG capsule, TAKE 1 CAPSULE BY MOUTH DAILY. ?  metoprolol succinate (TOPROL-XL) 25 MG 24 hr tablet, Take 0.5 tablets (12.5 mg total) by mouth daily. ? ? ? ? ?Current Outpatient Medications (Other):  ?  Calcium-Phosphorus-Vitamin D (CITRACAL +D3 PO), Take 2 tablets by mouth daily. ?  Clindamycin-Benzoyl Per, Refr, gel, Apply 1 g topically 2 (two) times daily. ?  Misc Natural Products (AIRBORNE ELDERBERRY) CHEW, Chew 2 each by mouth daily. Nature's Diana Eves Gummies ?  Misc Natural Products (LUTEIN 20 PO), Take 1 each by mouth daily. ?  Multiple Vitamins-Minerals (CENTRUM ADULTS PO), Take by mouth. ?  OVER THE COUNTER MEDICATION, Take 2 each by mouth 2 (two) times daily. Bio Complete 3 ? ? ?Reviewed prior external information including notes and imaging from  ?primary care provider ?As well as notes that were available from care everywhere and other healthcare systems. ? ?Past medical history, social, surgical and family history all reviewed in electronic medical record.  No pertanent information unless stated regarding to the chief complaint.  ? ?Review of Systems: ? No headache, visual changes, nausea, vomiting, diarrhea, constipation, dizziness, abdominal pain, skin rash, fevers, chills, night sweats, weight loss, swollen lymph nodes, body aches, joint swelling, chest pain, shortness of breath, mood changes. POSITIVE muscle aches ? ?Objective  ?Blood pressure 130/82, pulse (!) 106, height $RemoveBef'5\' 3"'GUPqrUJqRr$  (1.6 m), weight 178 lb (80.7 kg), last menstrual period 07/21/2015, SpO2 97 %. ?  ?General: No apparent distress alert and oriented x3 mood and affect normal, dressed appropriately.  ?HEENT:  Pupils equal, extraocular movements intact  ?Respiratory: Patient's speak in full sentences and does not appear short of breath  ?Cardiovascular: No lower extremity edema, non tender, no erythema  ?Gait normal with good balance and coordination.  ?MSK: Right ankle exam still has some mild swelling noted at the insertion of the Achilles.  Haglund nodule still noted.  The patient does have tender to palpation in this area.  Negative straight leg test.  Neurovascularly intact distally. ? ?Limited muscular skeletal ultrasound was performed and interpreted by Hulan Saas, M  ?Limited ultrasound of patient's Achilles does show that patient has some increase in Doppler flow within the tendon that is consistent with interstitial tearing noted.  No  significant retraction.  Less calcific changes from previous exam. ?Impression: Calcific changes that improved but unfortunately continued interstitial tearing noted. ? ?  ?Impression and Recommendations:  ?  ?The above documentation has been reviewed and is accurate and complete Lyndal Pulley, DO ? ? ? ?

## 2021-12-03 ENCOUNTER — Ambulatory Visit: Payer: 59 | Admitting: Family Medicine

## 2021-12-03 ENCOUNTER — Encounter: Payer: Self-pay | Admitting: Family Medicine

## 2021-12-03 ENCOUNTER — Ambulatory Visit: Payer: Self-pay

## 2021-12-03 VITALS — BP 130/82 | HR 106 | Ht 63.0 in | Wt 178.0 lb

## 2021-12-03 DIAGNOSIS — M79671 Pain in right foot: Secondary | ICD-10-CM | POA: Diagnosis not present

## 2021-12-03 DIAGNOSIS — M7661 Achilles tendinitis, right leg: Secondary | ICD-10-CM

## 2021-12-03 NOTE — Patient Instructions (Signed)
Congrats! ?Lets give it a little more time ?Read about PRP ?See you again in 8 weeks ?

## 2021-12-03 NOTE — Assessment & Plan Note (Signed)
Continues to have some calcific changes noted.  Once again discussed the possibility of advanced imaging which patient declined at the moment.  We also discussed the possibility of PRP which patient wants to monitor and see how patient continues to improve.  Did do the shockwave therapy that did make some improvement but continues to have some inflammation in the area.  After discussing with patient greater than 31 minutes patient wants to continue with the conservative therapy and will follow-up again in 2 months ?

## 2021-12-24 ENCOUNTER — Ambulatory Visit: Payer: 59

## 2022-01-03 ENCOUNTER — Other Ambulatory Visit: Payer: Self-pay | Admitting: Family Medicine

## 2022-01-03 DIAGNOSIS — I10 Essential (primary) hypertension: Secondary | ICD-10-CM

## 2022-01-05 ENCOUNTER — Other Ambulatory Visit: Payer: Self-pay

## 2022-01-06 ENCOUNTER — Other Ambulatory Visit: Payer: Self-pay | Admitting: Family Medicine

## 2022-01-06 ENCOUNTER — Other Ambulatory Visit: Payer: Self-pay

## 2022-01-06 DIAGNOSIS — I10 Essential (primary) hypertension: Secondary | ICD-10-CM

## 2022-01-06 NOTE — Telephone Encounter (Signed)
Tried to call pt and her Mailbox is full  ?

## 2022-01-07 ENCOUNTER — Other Ambulatory Visit: Payer: Self-pay

## 2022-01-07 MED ORDER — METOPROLOL SUCCINATE ER 25 MG PO TB24
12.5000 mg | ORAL_TABLET | Freq: Every day | ORAL | 1 refills | Status: DC
  Filled 2022-01-07: qty 45, 90d supply, fill #0
  Filled 2022-03-27: qty 45, 90d supply, fill #1

## 2022-01-07 MED ORDER — HYDROCHLOROTHIAZIDE 12.5 MG PO CAPS
ORAL_CAPSULE | Freq: Every day | ORAL | 1 refills | Status: DC
  Filled 2022-01-07: qty 90, 90d supply, fill #0
  Filled 2022-03-27: qty 90, 90d supply, fill #1

## 2022-01-07 NOTE — Telephone Encounter (Signed)
Pt called and scheduled next available with PCP, pt needs refills to last her until then she says  ?

## 2022-01-07 NOTE — Addendum Note (Signed)
Addended by: Matilde Sprang on: 01/07/2022 01:51 PM ? ? Modules accepted: Orders ? ?

## 2022-01-07 NOTE — Telephone Encounter (Signed)
Requested Prescriptions  ?Pending Prescriptions Disp Refills  ?? metoprolol succinate (TOPROL-XL) 25 MG 24 hr tablet 45 tablet 1  ?  Sig: Take 0.5 tablets (12.5 mg total) by mouth daily.  ?  ? Cardiovascular:  Beta Blockers Passed - 01/07/2022  1:51 PM  ?  ?  Passed - Last BP in normal range  ?  BP Readings from Last 1 Encounters:  ?12/03/21 130/82  ?   ?  ?  Passed - Last Heart Rate in normal range  ?  Pulse Readings from Last 1 Encounters:  ?12/03/21 (!) 106  ?   ?  ?  Passed - Valid encounter within last 6 months  ?  Recent Outpatient Visits   ?      ? 4 months ago Well adult exam  ? [facility] Steele Sizer, MD  ? 1 year ago Benign hypertension  ? [facility] [place], Drue Stager, MD  ? 2 years ago Benign hypertension  ? [facility] [place], Drue Stager, MD  ? 3 years ago Benign hypertension  ? [facility] Steele Sizer, MD  ? 4 years ago Well woman exam  ? [facility] Steele Sizer, MD  ?  ?  ?Future Appointments   ?        ? In 2 weeks Steele Sizer, MD [facility] [place]  ? In 3 weeks [facility]  ? In 7 months Steele Sizer, MD [facility]  ?  ? ?  ?  ?  ?? hydrochlorothiazide (MICROZIDE) 12.5 MG capsule 90 capsule 1  ?  Sig: TAKE 1 CAPSULE BY MOUTH DAILY.  ?  ? Cardiovascular: Diuretics - Thiazide Passed - 01/07/2022  1:51 PM  ?  ?  Passed - Cr in normal range and within 180 days  ?  Creat  ?Date Value Ref Range Status  ?08/28/2021 0.79 0.50 - 1.03 mg/dL Final  ?   ?  ?  Passed - K in normal range and within 180 days  ?  Potassium  ?Date Value Ref Range Status  ?08/28/2021 4.2 3.5 - 5.3 mmol/L Final  ?   ?  ?  Passed - Na in normal range and within 180 days  ?  Sodium  ?Date Value Ref Range Status  ?08/28/2021 139 135 - 146 mmol/L Final  ?03/28/2015 140 134 - 144 mmol/L Final  ?   ?  ?  Passed - Last BP in normal range  ?  BP Readings  from Last 1 Encounters:  ?12/03/21 130/82  ?   ?  ?  Passed - Valid encounter within last 6 months  ?  Recent Outpatient Visits   ?      ? 4 months ago Well adult exam  ? [facility] Steele Sizer, MD  ? 1 year ago Benign hypertension  ? [facility] [place], Drue Stager, MD  ? 2 years ago Benign hypertension  ? [facility] [place], Drue Stager, MD  ? 3 years ago Benign hypertension  ? [facility] Sizer, MD  ? 4 years ago Well woman exam  ? [facility] Steele Sizer, MD  ?  ?  ?Future Appointments   ?        ? In 2 weeks Steele Sizer, MD [facility] [place]  ? In 3 weeks Lyndal Pulley, DO [facility]  ? In 7 months Steele Sizer, MD [facility], [place]  ?  ? ?  ?  ?  ?Refused Prescriptions Disp Refills  ?? hydrochlorothiazide (MICROZIDE) 12.5 MG capsule 90 capsule 1  ?  Sig: TAKE 1 CAPSULE BY MOUTH DAILY.  ?  ? Cardiovascular: Diuretics - Thiazide Passed - 01/07/2022  1:51 PM  ?  ?  Passed - Cr in normal range and within 180 days  ?  Creat  ?Date Value Ref Range Status  ?08/28/2021 0.79 0.50 - 1.03 mg/dL Final  ?   ?  ?  Passed - K in normal range and within 180 days  ?  Potassium  ?Date Value Ref Range Status  ?08/28/2021 4.2 3.5 - 5.3 mmol/L Final  ?   ?  ?  Passed - Na in normal range and within 180 days  ?  Sodium  ?Date Value Ref Range Status  ?08/28/2021 139 135 - 146 mmol/L Final  ?03/28/2015 140 134 - 144 mmol/L Final  ?   ?  ?  Passed - Last BP in normal range  ?  BP Readings from Last 1 Encounters:  ?12/03/21 130/82  ?   ?  ?  Passed - Valid encounter within last 6 months  ?  Recent Outpatient Visits   ?      ? 4 months ago Well adult exam  ? [facility] Steele Sizer, MD  ? 1 year ago Benign hypertension  ? [facility] [place], Drue Stager, MD  ? 2 years ago Benign hypertension  ? [facility]  [place], Drue Stager, MD  ? 3 years ago Benign hypertension  ? [facility] Steele Sizer, MD  ? 4 years ago Well woman exam  ? [facility] Steele Sizer, MD  ?  ?  ?Future Appointments   ?        ? In 2 weeks Steele Sizer, MD [facility] [place]  ? In 3 weeks [facility]  ? In 7 months Steele Sizer, MD [facility]  ?  ? ?  ?  ?  ?? metoprolol succinate (TOPROL-XL) 25 MG 24 hr tablet 45 tablet 1  ?  Sig: Take 0.5 tablets (12.5 mg total) by mouth daily.  ?  ? Cardiovascular:  Beta Blockers Passed - 01/07/2022  1:51 PM  ?  ?  Passed - Last BP in normal range  ?  BP Readings from Last 1 Encounters:  ?12/03/21 130/82  ?   ?  ?  Passed - Last Heart Rate in normal range  ?  Pulse Readings from Last 1 Encounters:  ?12/03/21 (!) 106  ?   ?  ?  Passed - Valid encounter within last 6 months  ?  Recent Outpatient Visits   ?      ? 4 months ago Well adult exam  ? [facility] Steele Sizer, MD  ? 1 year ago Benign hypertension  ? [facility] [place], Drue Stager, MD  ? 2 years ago Benign hypertension  ? [facility] [place], Drue Stager, MD  ? 3 years ago Benign hypertension  ? [facility] Steele Sizer, MD  ? 4 years ago Well woman exam  ? [facility] Steele Sizer, MD  ?  ?  ?Future Appointments   ?        ? In  2 weeks Steele Sizer, MD [facility] [place]  ? In 3 weeks [facility]  ? In 7 months Steele Sizer, MD [facility] [place]  ?  ? ?  ?  ?  ? ? ?

## 2022-01-21 NOTE — Progress Notes (Unsigned)
Name: Sydney [name]   MRN: [id number]    DOB: July 25, 1966   Date:01/22/2022       Progress Note  Subjective  Chief Complaint  Medication Refill  HPI  HTN: she has been taking Metoprolol half pill and HCTZ for the past year, she  denies dizziness, SOB or chest pain. She has been taking medications as prescribed.    Dyslipidemia: on diet only, no chest pain or palpitation   The 10-year ASCVD risk score (Arnett DK, et al., 2019) is: 2.9%   Values used to calculate the score:     Age: 56 years     Sex: Female     Is Non-Hispanic African American: No     Diabetic: No     Tobacco smoker: No     Systolic Blood Pressure: 56 mmHg     Is BP treated: Yes     HDL Cholesterol: 70 mg/dL     Total Cholesterol: 956 mg/dL    Metabolic Metabolic: she denies polyphagia, polyuria or polyphagia. She states she has been eating healthier and walking more at work   Right achilles pain: seeing sports medicine and still has pain and swelling   Patient Active Problem List   Diagnosis Date Noted   Acne vulgaris 01/22/2022   Achilles tendinitis of right lower extremity 05/07/2021   Patellofemoral syndrome of left knee 05/07/2021   Low back pain with sciatica 02/24/2015   Dyslipidemia 02/24/2015   Dermatitis, eczematoid 02/24/2015   GERD without esophagitis 02/24/2015   Benign hypertension 02/24/2015   History of iron deficiency anemia 38/75/6433   Dysmetabolic syndrome 29/51/8841   Obesity (BMI 30.0-34.9) 02/24/2015   Allergic rhinitis 02/24/2015   Hematuria, microscopic 06/04/2010    Past Surgical History:  Procedure Laterality Date   oral surgery  Left 10/2020   molar tooth implant    URETHRAL DILATION  Age 85    Family History  Problem Relation Age of Onset   Hypertension Mother    Hyperlipidemia Mother    Dementia Mother    Diabetes Father    CAD Father    Psoriasis Father    Heart disease Father    Cancer Father        Prostate and Multiple Myeloma   Asthma Sister     Thyroid disease Sister    Diabetes Sister    Breast cancer Neg Hx     Social History   Tobacco Use   Smoking status: Never   Smokeless tobacco: Never  Substance Use Topics   Alcohol use: No    Alcohol/week: 0.0 standard drinks     Current Outpatient Medications:    Calcium-Phosphorus-Vitamin D (CITRACAL +D3 PO), Take 2 tablets by mouth daily., Disp: , Rfl:    Cholecalciferol (D3-1000 PO), Take by mouth daily., Disp: , Rfl:    hydrochlorothiazide (MICROZIDE) 12.5 MG capsule, TAKE 1 CAPSULE BY MOUTH DAILY., Disp: 90 capsule, Rfl: 1   ibuprofen (ADVIL) 200 MG tablet, Take 200 mg by mouth as needed., Disp: , Rfl:    Lutein-Zeaxanthin-Bilberry 20-4-40 MG CHEW, Chew by mouth daily., Disp: , Rfl:    metoprolol succinate (TOPROL-XL) 25 MG 24 hr tablet, Take 0.5 tablets (12.5 mg total) by mouth daily., Disp: 45 tablet, Rfl: 1   Misc Natural Products (AIRBORNE ELDERBERRY) CHEW, Chew 2 each by mouth daily. Nature's Bounty Elderberry Gummies, Disp: , Rfl:    Misc Natural Products (LUTEIN 20 PO), Take 1 each by mouth daily., Disp: , Rfl:    Multiple Vitamins-Minerals (  CENTRUM ADULTS PO), Take by mouth., Disp: , Rfl:    Clindamycin-Benzoyl Per, Refr, gel, Apply 1 g topically 2 (two) times daily., Disp: 45 g, Rfl: 2  Allergies  Allergen Reactions   Amoxicillin    Sulfa Antibiotics     I personally reviewed active problem list, medication list, allergies, family history, social history, health maintenance with the patient/caregiver today.   ROS  Constitutional: Negative for fever or weight change.  Respiratory: Negative for cough and shortness of breath.   Cardiovascular: Negative for chest pain or palpitations.  Gastrointestinal: Negative for abdominal pain, no bowel changes.  Musculoskeletal: positive for gait problem and right ankle  joint swelling.  Skin: Negative for rash.  Neurological: Negative for dizziness or headache.  No other specific complaints in a complete review of  systems (except as listed in HPI above).   Objective  Vitals:   01/22/22 0844  BP: 134/86  Pulse: 92  Resp: 16  SpO2: 99%  Weight: 182 lb (82.6 kg)  Height: $Remove'5\' 3"'MZynsZq$  (1.6 m)    Body mass index is 32.24 kg/m.  Physical Exam  Constitutional: Patient appears well-developed and well-nourished. Obese  No distress.  HEENT: head atraumatic, normocephalic, pupils equal and reactive to light, neck supple Cardiovascular: Normal rate, regular rhythm and normal heart sounds.  No murmur heard. No BLE edema. Pulmonary/Chest: Effort normal and breath sounds normal. No respiratory distress. Abdominal: Soft.  There is no tenderness. Psychiatric: Patient has a normal mood and affect. behavior is normal. Judgment and thought content normal.  Muscular skeletal: wearing a brace of right ankle   PHQ2/9:    01/22/2022    8:43 AM 08/28/2021    8:18 AM 12/12/2020    7:44 AM 11/22/2019    9:50 AM 09/29/2018    9:50 AM  Depression screen PHQ 2/9  Decreased Interest 0 0 0 0 0  Down, Depressed, Hopeless 0 0 0 0 0  PHQ - 2 Score 0 0 0 0 0  Altered sleeping 0 0   0  Tired, decreased energy 0 0   1  Change in appetite 0 0   0  Feeling bad or failure about yourself  0 0   0  Trouble concentrating 0 0   0  Moving slowly or fidgety/restless 0 0   0  Suicidal thoughts 0 0   0  PHQ-9 Score 0 0   1  Difficult doing work/chores     Not difficult at all    phq 9 is negative   Fall Risk:    01/22/2022    8:43 AM 08/28/2021    8:18 AM 12/12/2020    7:44 AM 11/22/2019    9:50 AM 09/29/2018    9:48 AM  Fall Risk   Falls in the past year? 0 0 0 1 0  Comment    Fell on [employer]   Number falls in past yr: 0 0 0 0   Injury with Fall? 0 0 0 0   Risk for fall due to : No Fall Risks No Fall Risks     Follow up Falls prevention discussed Falls prevention discussed         Functional Status Survey: Is the patient deaf or have difficulty hearing?: No Does the patient have difficulty seeing, even when wearing  glasses/contacts?: No Does the patient have difficulty concentrating, remembering, or making decisions?: No Does the patient have difficulty walking or climbing stairs?: No Does the patient have difficulty dressing or bathing?: No  Does the patient have difficulty doing errands alone such as visiting a doctor's office or shopping?: No    Assessment & Plan  Problem List Items Addressed This Visit     Benign hypertension    BP is at goal        Acne vulgaris   Relevant Medications   Clindamycin-Benzoyl Per, Refr, gel   Dyslipidemia   Dysmetabolic syndrome - Primary   Other Visit Diagnoses     Need for shingles vaccine       Relevant Orders   Varicella-zoster vaccine IM (Completed)

## 2022-01-22 ENCOUNTER — Other Ambulatory Visit: Payer: Self-pay

## 2022-01-22 ENCOUNTER — Encounter: Payer: Self-pay | Admitting: Family Medicine

## 2022-01-22 ENCOUNTER — Ambulatory Visit: Payer: 59 | Admitting: Family Medicine

## 2022-01-22 VITALS — BP 134/86 | HR 92 | Resp 16 | Ht 63.0 in | Wt 182.0 lb

## 2022-01-22 DIAGNOSIS — E785 Hyperlipidemia, unspecified: Secondary | ICD-10-CM

## 2022-01-22 DIAGNOSIS — L700 Acne vulgaris: Secondary | ICD-10-CM

## 2022-01-22 DIAGNOSIS — Z23 Encounter for immunization: Secondary | ICD-10-CM

## 2022-01-22 DIAGNOSIS — I10 Essential (primary) hypertension: Secondary | ICD-10-CM | POA: Diagnosis not present

## 2022-01-22 DIAGNOSIS — E8881 Metabolic syndrome: Secondary | ICD-10-CM

## 2022-01-22 MED ORDER — CLINDAMYCIN PHOS-BENZOYL PEROX 1.2-5 % EX GEL
1.0000 g | Freq: Two times a day (BID) | CUTANEOUS | 2 refills | Status: DC
  Filled 2022-01-22: qty 45, 30d supply, fill #0

## 2022-01-22 NOTE — Assessment & Plan Note (Signed)
BP is at goal. 

## 2022-01-27 NOTE — Progress Notes (Unsigned)
[name] Phone: [phone] Subjective:   Sydney [name], am serving as a scribe for Dr. Hulan Saas.   I'm seeing this patient by the request  of:  Steele Sizer, MD  CC: right foot pain   [id number]  12/03/2021 Continues to have some calcific changes noted.  Once again discussed the possibility of advanced imaging which patient declined at the moment.  We also discussed the possibility of PRP which patient wants to monitor and see how patient continues to improve.  Did do the shockwave therapy that did make some improvement but continues to have some inflammation in the area.  After discussing with patient greater than 31 minutes patient wants to continue with the conservative therapy and will follow-up again in 2 months  Update 01/28/2022 Sydney [name] is a 56 y.o. female coming in with complaint of R foot pain. Patient states that she has been doing better. Walking more at work and she is able to move with less pain. Doing water aerobics and and pushed off the foot and this increased her pain in achilles tendon. Pain is slowly improving. Also notes foot cramping at night.   Also c/o jamming hand a few months ago. Hit hand again recently and states that R [facility] joint has been sore on and off. Also c/o  R anterior shoulder pain. Patient notes pulling drawer in nightstand from a lying down position and she feels she injured shoulder over time doing this motion.        Past Medical History:  Diagnosis Date   Allergy    Anemia    Eczema    GERD (gastroesophageal reflux disease)    Hematuria    Hyperlipidemia    Hypertension    Kidney stone on left side    Lump in female breast    seen by Dr. Fleet Contras   Metabolic syndrome    Microscopic hematuria    Vitreous detachment of right eye 08/22/2019   Dr. 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  Tobacco Use   Smoking status: Never   Smokeless tobacco: Never  Vaping Use   Vaping Use: Never used  Substance and Sexual Activity   Alcohol use: [name]    Alcohol/week: 0.0 standard drinks   Drug use: [name]   Sexual activity: Not Currently  Other Topics Concern   Not on file  Social History Narrative   Works for [employer], lives alone   She has one cat   Mother is in memory care now    Social Determinants of Health   Financial Resource Strain: Low Risk    Difficulty of Paying Living Expenses: Not hard at all  Food Insecurity: [name] Food Insecurity   Worried About Charity fundraiser in the Last Year: Never true   Arboriculturist in the Last Year: Never true  Transportation Needs: [name] Transportation Needs   Lack of Transportation (Medical):  [name]   Lack of Transportation (Non-Medical): [name]  Physical Activity: Inactive   Days of Exercise per Week: 0 days   Minutes of Exercise per Session: 0 min  Stress: [name] Stress Concern Present   Feeling of Stress : Only a little  Social Connections: Moderately Integrated   Frequency of Communication with Friends and Family: Three times a week   Frequency of Social Gatherings with Friends and Family: Once a week   Attends Religious Services: More than 4 times per year   Active Member of [employer] or Organizations: Yes   Attends Music therapist: 1 to 4 times per year   Marital Status: Never married   Allergies  Allergen Reactions   Amoxicillin    Sulfa Antibiotics    Family History  Problem Relation Age of Onset   Hypertension Mother    Hyperlipidemia Mother     Dementia Mother    Diabetes Father    CAD Father    Psoriasis Father    Heart disease Father    Cancer Father        Prostate and Multiple Myeloma   Asthma Sister    Thyroid disease Sister    Diabetes Sister    Breast cancer Neg Hx      Current Outpatient Medications (Cardiovascular):    hydrochlorothiazide (MICROZIDE) 12.5 MG capsule, TAKE 1 CAPSULE BY MOUTH DAILY.   metoprolol succinate (TOPROL-XL) 25 MG 24 hr tablet, Take 0.5 tablets (12.5 mg total) by mouth daily.   Current Outpatient Medications (Analgesics):    ibuprofen (ADVIL) 200 MG tablet, Take 200 mg by mouth as needed.   Current Outpatient Medications (Other):    Calcium-Phosphorus-Vitamin D (CITRACAL +D3 PO), Take 2 tablets by mouth daily.   Cholecalciferol (D3-1000 PO), Take by mouth daily.   Clindamycin-Benzoyl Per, Refr, gel, Apply 1 g topically 2 (two) times daily.   Lutein-Zeaxanthin-Bilberry 20-4-40 MG CHEW, Chew by mouth daily.   Misc Natural Products (AIRBORNE ELDERBERRY) CHEW, Chew 2 each by mouth daily. Nature's Bounty Elderberry Gummies   Misc Natural Products (LUTEIN 20 PO), Take 1 each by mouth daily.   Multiple Vitamins-Minerals (CENTRUM ADULTS PO), Take by mouth.   Reviewed prior external information including notes and imaging from  primary care provider As well as notes that were available from care everywhere and other healthcare systems.  Past medical history, social, surgical and family history all reviewed in electronic medical record.  [name] pertanent information unless stated regarding to the chief complaint.   Review of Systems:  [name] headache, visual changes, nausea, vomiting, diarrhea, constipation, dizziness, abdominal pain, skin rash, fevers, chills, night sweats, weight loss, swollen lymph nodes, body aches, joint swelling, chest pain, shortness of breath, mood changes. POSITIVE muscle aches  Objective  Blood pressure 124/86, pulse 83, height $RemoveBe'5\' 3"'deoIZeKER$  (1.6 m), weight 182 lb (82.6 kg), last  menstrual period 07/21/2015, SpO2 98 %.   General: [name] apparent distress alert and oriented x3 mood and affect normal, dressed appropriately.  HEENT: Pupils equal, extraocular movements intact  Respiratory: Patient's speak in full sentences and does not appear short of breath  Cardiovascular: [name] lower extremity edema, non tender, [name] erythema  Gait normal with good balance and coordination.  MSK:  right foot pain on the achilles. Haglund nodule noted full ROM  Left wrist exam shows the patient does have some tenderness to palpation over the distal radius.  Patient Outlook negative pain with palpation.  Negative grind test.   Limited muscular skeletal ultrasound was performed  and interpreted by Hulan Saas, M  Limited ultrasound shows the patient does still have some calcific changes noted in the Achilles but [name] significant hypoechoic changes noted. Regarding patient's left wrist patient does have a cortical irregularity noted of the distal radius.  Appears to have some increase in neovascularization that is acute.  This could be consistent with a injury that could be even a nondisplaced fracture.  Scaphoid bone though appears to be unremarkable. Impression: Improvement in Achilles tendinitis and possible distal radius fracture     Impression and Recommendations:     The above documentation has been reviewed and is accurate and complete Lyndal Pulley, DO

## 2022-01-28 ENCOUNTER — Ambulatory Visit: Payer: Self-pay

## 2022-01-28 ENCOUNTER — Other Ambulatory Visit: Payer: Self-pay | Admitting: Family Medicine

## 2022-01-28 ENCOUNTER — Ambulatory Visit: Payer: 59 | Admitting: Family Medicine

## 2022-01-28 ENCOUNTER — Ambulatory Visit (INDEPENDENT_AMBULATORY_CARE_PROVIDER_SITE_OTHER): Payer: 59

## 2022-01-28 VITALS — BP 124/86 | HR 83 | Ht 63.0 in | Wt 182.0 lb

## 2022-01-28 DIAGNOSIS — M25512 Pain in left shoulder: Secondary | ICD-10-CM | POA: Diagnosis not present

## 2022-01-28 DIAGNOSIS — M25531 Pain in right wrist: Secondary | ICD-10-CM

## 2022-01-28 DIAGNOSIS — M25532 Pain in left wrist: Secondary | ICD-10-CM | POA: Diagnosis not present

## 2022-01-28 DIAGNOSIS — M79671 Pain in right foot: Secondary | ICD-10-CM | POA: Diagnosis not present

## 2022-01-28 DIAGNOSIS — M7661 Achilles tendinitis, right leg: Secondary | ICD-10-CM | POA: Diagnosis not present

## 2022-01-28 NOTE — Assessment & Plan Note (Signed)
Patient does have a cortical irregularity noted of the distal radius.  Patient's [place] joint does not appear to have any significant irregularity but does have some mild hypoechoic changes noted on ultrasound.  X-rays are pending.  Discussed with patient about a thumb spica splint.  Due to this being greater than the 2 months since injury I do think that patient is going to heal appropriately and is neurovascular intact with no significant abnormality noted on inspection.  Depending on x-ray findings though this will tell us how long we need to be in the brace.  Patient will follow-up with me again in 4 to 6 weeks to make sure patient is healing appropriately.

## 2022-01-28 NOTE — Patient Instructions (Addendum)
Thumb spica day and night for 2 weeks then nightly for 2 weeks Wrist Xray L today Ankle is better Plus or minus the brace See me in 5-6 weeks

## 2022-01-28 NOTE — Assessment & Plan Note (Signed)
Patient does have calcific changes noted.  Patient has improvement though with no significant tearing which is an improvement from previous exam.  No significant increase in Doppler flow either.  Hopefully patient will continue to make improvement.  Discussed icing regimen and home exercises.  Discussed wearing the brace on a less regular basis.  We will follow-up again in 6 to 8 weeks.

## 2022-01-30 DIAGNOSIS — D225 Melanocytic nevi of trunk: Secondary | ICD-10-CM | POA: Diagnosis not present

## 2022-01-30 DIAGNOSIS — D2271 Melanocytic nevi of right lower limb, including hip: Secondary | ICD-10-CM | POA: Diagnosis not present

## 2022-01-30 DIAGNOSIS — H01134 Eczematous dermatitis of left upper eyelid: Secondary | ICD-10-CM | POA: Diagnosis not present

## 2022-01-30 DIAGNOSIS — L720 Epidermal cyst: Secondary | ICD-10-CM | POA: Diagnosis not present

## 2022-01-30 DIAGNOSIS — H01131 Eczematous dermatitis of right upper eyelid: Secondary | ICD-10-CM | POA: Diagnosis not present

## 2022-01-30 DIAGNOSIS — D2272 Melanocytic nevi of left lower limb, including hip: Secondary | ICD-10-CM | POA: Diagnosis not present

## 2022-02-06 ENCOUNTER — Other Ambulatory Visit: Payer: Self-pay

## 2022-02-09 ENCOUNTER — Telehealth: Payer: Self-pay | Admitting: Family Medicine

## 2022-02-09 NOTE — Telephone Encounter (Signed)
Patient called with questions regarding her recent xray. She said that she saw on MyChart where she saw that no fracture was seen so she would like to know if there are any changes to her current treatment plan?  Patient is not able to use MyChart so she asked if someone could call her in the afternoon.

## 2022-02-10 NOTE — Telephone Encounter (Signed)
Still with what was seen on ultrasound, would continue with the plan.  Sometimes xrays are not as sensitive for small fractures

## 2022-02-10 NOTE — Telephone Encounter (Signed)
Spoke with patient per results.  

## 2022-02-10 NOTE — Telephone Encounter (Signed)
Tied to call patient but VM box is full.

## 2022-02-19 ENCOUNTER — Ambulatory Visit
Admission: RE | Admit: 2022-02-19 | Discharge: 2022-02-19 | Disposition: A | Discharge status: Home / Self Care | Payer: 59 | Source: Ambulatory Visit | Attending: Family Medicine | Admitting: Family Medicine

## 2022-02-19 DIAGNOSIS — Z1231 Encounter for screening mammogram for malignant neoplasm of breast: Secondary | ICD-10-CM | POA: Insufficient documentation

## 2022-03-11 NOTE — Progress Notes (Unsigned)
Sydney [name] [place] [address] [place] Phone: [phone] Subjective:   Sydney [name], am serving as a scribe for Dr. Hulan Saas.  I'm seeing this patient by the request  of:  Steele Sizer, MD  CC: left shoulder   [id number]  01/28/2022 Patient does have a cortical irregularity noted of the distal radius.  Patient's [place] joint does not appear to have any significant irregularity but does have some mild hypoechoic changes noted on ultrasound.  X-rays are pending.  Discussed with patient about a thumb spica splint.  Due to this being greater than the 2 months since injury I do think that patient is going to heal appropriately and is neurovascular intact with no significant abnormality noted on inspection.  Depending on x-ray findings though this will tell us how long we need to be in the brace.  Patient will follow-up with me again in 4 to 6 weeks to make sure patient is healing appropriately.  Patient does have calcific changes noted.  Patient has improvement though with no significant tearing which is an improvement from previous exam.  No significant increase in Doppler flow either.  Hopefully patient will continue to make improvement.  Discussed icing regimen and home exercises.  Discussed wearing the brace on a less regular basis.  We will follow-up again in 6 to 8 weeks.  Update 03/12/2022 Sydney [name] is a 56 y.o. female coming in with complaint of R shoulder, L wrist, R foot pain. Patient states doing better, has less pain. Wrist is also getting better. Right foot concerned about hip ER.       Past Medical History:  Diagnosis Date   Allergy    Anemia    Eczema    GERD (gastroesophageal reflux disease)    Hematuria    Hyperlipidemia    Hypertension    Kidney stone on left side    Lump in female breast    seen by Dr. Fleet Contras   Metabolic syndrome    Microscopic hematuria    Vitreous detachment of right eye 08/22/2019    Dr. 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  Tobacco Use   Smoking status: Never   Smokeless tobacco: Never  Vaping Use   Vaping Use: Never used  Substance and Sexual Activity   Alcohol use: No    Alcohol/week: 0.0 standard drinks of alcohol   Drug use: No   Sexual activity: Not Currently  Other Topics Concern   Not on file  Social History Narrative   Works for [employer], lives alone   She has one cat   Mother is in memory care now    Social Determinants of Health   Financial Resource Strain: Low Risk  (08/28/2021)   Overall Financial Resource Strain (CARDIA)    Difficulty of Paying Living Expenses: Not hard at all  Food Insecurity: No Food Insecurity (08/28/2021)   Hunger Vital Sign    Worried About Running Out of  Food in the Last Year: Never true    [place] in the Last Year: Never true  Transportation Needs: No Transportation Needs (08/28/2021)   PRAPARE - Hydrologist (Medical): No    Lack of Transportation (Non-Medical): No  Physical Activity: Inactive (08/28/2021)   Exercise Vital Sign    Days of Exercise per Week: 0 days    Minutes of Exercise per Session: 0 min  Stress: No Stress Concern Present (08/28/2021)   [place]    Feeling of Stress : Only a little  Social Connections: Moderately Integrated (08/28/2021)   Social Connection and Isolation Panel [NHANES]    Frequency of Communication with Friends and Family: Three times a week     Frequency of Social Gatherings with Friends and Family: Once a week    Attends Religious Services: More than 4 times per year    Active Member of [employer] or Organizations: Yes    Attends Music therapist: 1 to 4 times per year    Marital Status: Never married   Allergies  Allergen Reactions   Amoxicillin    Sulfa Antibiotics    Family History  Problem Relation Age of Onset   Hypertension Mother    Hyperlipidemia Mother    Dementia Mother    Diabetes Father    CAD Father    Psoriasis Father    Heart disease Father    Cancer Father        Prostate and Multiple Myeloma   Asthma Sister    Thyroid disease Sister    Diabetes Sister    Breast cancer Neg Hx      Current Outpatient Medications (Cardiovascular):    hydrochlorothiazide (MICROZIDE) 12.5 MG capsule, TAKE 1 CAPSULE BY MOUTH DAILY.   metoprolol succinate (TOPROL-XL) 25 MG 24 hr tablet, Take 0.5 tablets (12.5 mg total) by mouth daily.   Current Outpatient Medications (Analgesics):    ibuprofen (ADVIL) 200 MG tablet, Take 200 mg by mouth as needed.   Current Outpatient Medications (Other):    Calcium-Phosphorus-Vitamin D (CITRACAL +D3 PO), Take 2 tablets by mouth daily.   Cholecalciferol (D3-1000 PO), Take by mouth daily.   Clindamycin-Benzoyl Per, Refr, gel, Apply 1 g topically 2 (two) times daily.   Lutein-Zeaxanthin-Bilberry 20-4-40 MG CHEW, Chew by mouth daily.   Misc Natural Products (AIRBORNE ELDERBERRY) CHEW, Chew 2 each by mouth daily. Nature's Bounty Elderberry Gummies   Misc Natural Products (LUTEIN 20 PO), Take 1 each by mouth daily.   Multiple Vitamins-Minerals (CENTRUM ADULTS PO), Take by mouth.   Reviewed prior external information including notes and imaging from  primary care provider As well as notes that were available from care everywhere and other healthcare systems.  Past medical history, social, surgical and family history all reviewed in electronic medical record.  No pertanent  information unless stated regarding to the chief complaint.   Review of Systems:  No headache, visual changes, nausea, vomiting, diarrhea, constipation, dizziness, abdominal pain, skin rash, fevers, chills, night sweats, weight loss, swollen lymph nodes, body aches, joint swelling, chest pain, shortness of breath, mood changes. POSITIVE muscle aches  Objective  Blood pressure 124/78, pulse 88, height $RemoveBe'5\' 3"'LtsIbHRXk$  (1.6 m), weight 164 lb (74.4 kg), last menstrual period 07/21/2015, SpO2 97 %.   General: No apparent distress alert and oriented x3 mood and affect normal, dressed appropriately.  HEENT: Pupils equal, extraocular movements intact  Respiratory: Patient's speak in full  sentences and does not appear short of breath  Cardiovascular: No lower extremity edema, non tender, no erythema  Patient still has a mild antalgic gait noted.  Patient does have tightness of the Achilles but continues to improve.  No significant swelling noted in this area. Patient left wrist mild discomfort noted.  Seem to be more over the [facility] joint.  Limited muscular skeletal ultrasound was performed and interpreted by Hulan Saas, M  Limited ultrasound of patient's ankle shows the patient still has some mild calcific changes noted of the Achilles near the insertion.  Patient does have some very mild hypoechoic changes in neovascularization still ongoing in the tendon itself.  No true acute tear appreciated.  Regarding patient's left [facility] joint does have hypoechoic changes noted in it.  Mild narrowing noted.  Patient's cortical irregularity noted at the distal tip radius appears to have a callus formation noted.  Approximately 85% is a hard callus based on ultrasound.  Impression: Interval improvement of both difficulties.    Impression and Recommendations:     The above documentation has been reviewed and is accurate and complete Sydney Pulley, DO

## 2022-03-12 ENCOUNTER — Ambulatory Visit: Payer: 59 | Admitting: Family Medicine

## 2022-03-12 ENCOUNTER — Ambulatory Visit: Payer: Self-pay

## 2022-03-12 ENCOUNTER — Encounter: Payer: Self-pay | Admitting: Family Medicine

## 2022-03-12 VITALS — BP 124/78 | HR 88 | Ht 63.0 in | Wt 164.0 lb

## 2022-03-12 DIAGNOSIS — M25532 Pain in left wrist: Secondary | ICD-10-CM | POA: Diagnosis not present

## 2022-03-12 DIAGNOSIS — M79671 Pain in right foot: Secondary | ICD-10-CM | POA: Diagnosis not present

## 2022-03-12 DIAGNOSIS — M7661 Achilles tendinitis, right leg: Secondary | ICD-10-CM

## 2022-03-12 NOTE — Assessment & Plan Note (Signed)
Patient does have some [place] arthritis we will need to monitor.  Does have some mild swelling also noted.  If worsening symptoms may need to consider the possibility of injection.  Discussed icing regimen and home exercises otherwise.  Increase activity slowly.  Follow-up again in 6 to 8 weeks.Marland Kitchen

## 2022-03-12 NOTE — Patient Instructions (Signed)
Good to see you! Wear the ankle brace less and less Wear wrist brace as needed Both are making good progress No large changes See you again in 5-6 weeks

## 2022-03-12 NOTE — Assessment & Plan Note (Signed)
Patient does have more of an Achilles tendinitis noted.  Patient does have some very mild calcific changes and some mild distal hypoechoic changes and increasing neovascularization that is still little concerning but continues to make improvement.  Patient still wants to avoid anything more aggressive such as injections at the moment.  Follow-up with me again in 6 to 8 weeks

## 2022-03-29 ENCOUNTER — Other Ambulatory Visit: Payer: Self-pay

## 2022-03-30 ENCOUNTER — Other Ambulatory Visit: Payer: Self-pay

## 2022-04-15 NOTE — Progress Notes (Unsigned)
[place] Phone: [phone] Subjective:   Sydney Sydney [name], am serving as a scribe for Dr. Hulan [name].  I'm seeing this patient by the request  of:  Sydney Sizer, MD  CC: Left wrist, right ankle follow-up  IDH:[id number]  03/12/2022 Patient does have more of an Achilles tendinitis noted.  Patient does have some very mild calcific changes and some mild distal hypoechoic changes and increasing neovascularization that is still little concerning but continues to make improvement.  Patient still wants to avoid anything more aggressive such as injections at the moment.  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  Does have some mild swelling also noted.  If worsening symptoms may need to consider the possibility of injection.  Discussed icing regimen and home exercises otherwise.  Increase activity slowly.  Follow-up again in 6 to 8 weeks.Marland Kitchen  Update 04/16/2022 Sydney Sydney [name] is a 56 y.o. female coming in with complaint of R foot and L wrist pain. Patient states that she is doing better. Brace helped R ankle over the weekend when she hosted her family at her home and was on her feet a lot. Little pain.    L wrist is also doing much better. Sydney [name] longer wearing brace on a consistent basis. Has pain with putting on lotion but overall is improving. Wants to know how frequent she should still use brace to continue to hasten healing.        Past Medical History:  Diagnosis Date   Allergy    Anemia    Eczema    GERD (gastroesophageal reflux disease)    Hematuria    Hyperlipidemia    Hypertension    Kidney stone on left side    Lump in female breast    seen by Dr. Fleet Sydney [name]   Metabolic syndrome    Microscopic hematuria    Vitreous detachment of right eye 08/22/2019   Dr. 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  Tobacco Use   Smoking status: Never   Smokeless tobacco: Never  Vaping Use   Vaping Use: Never used  Substance and Sexual Activity   Alcohol use: Sydney [name]    Alcohol/week: 0.0 standard drinks of alcohol   Drug use: Sydney [name]   Sexual activity: Not Currently  Other Topics Concern   Not on file  Social History Narrative   Works for [employer], lives alone   She has one cat   Mother is in memory care now    Social Determinants of Health   Financial Resource Strain: [place]  (08/28/2021)   Overall Financial Resource Strain (CARDIA)    Difficulty of Paying Living Expenses: Not hard at all  Food Insecurity: Sydney [name] Food Insecurity (08/28/2021)   Hunger Vital Sign    Worried About [place] in the Last Year: Never true    Ran  Out of Food in the Last Year: Never true  Transportation Needs: Sydney [name] Transportation Needs (08/28/2021)   PRAPARE - Hydrologist (Medical): Sydney [name]    Lack of Transportation (Non-Medical): Sydney [name]  Physical Activity: Inactive (08/28/2021)   Exercise Vital Sign    Days of Exercise per Week: 0 days    Minutes of Exercise per Session: 0 min  Stress: Sydney [name] Stress Concern Present (08/28/2021)   [place]    Feeling of Stress : Only a little  Social Connections: Moderately Integrated (08/28/2021)   Social Connection and Isolation Panel [NHANES]    Frequency of Communication with Friends and Family: Three times a week    Frequency of Social Gatherings with Friends and Family: Once a week    Attends Religious  Services: More than 4 times per year    Active Member of [employer] or Organizations: Yes    Attends Music therapist: 1 to 4 times per year    Marital Status: Never married   Allergies  Allergen Reactions   Amoxicillin    Sulfa Antibiotics    Family History  Problem Relation Age of Onset   Hypertension Mother    Hyperlipidemia Mother    Dementia Mother    Diabetes Father    CAD Father    Psoriasis Father    Heart disease Father    Cancer Father        Prostate and Multiple Myeloma   Asthma Sister    Thyroid disease Sister    Diabetes Sister    Breast cancer Neg Hx      Current Outpatient Medications (Cardiovascular):    hydrochlorothiazide (MICROZIDE) 12.5 MG capsule, TAKE 1 CAPSULE BY MOUTH DAILY.   metoprolol succinate (TOPROL-XL) 25 MG 24 hr tablet, Take 0.5 tablets (12.5 mg total) by mouth daily.   Current Outpatient Medications (Analgesics):    ibuprofen (ADVIL) 200 MG tablet, Take 200 mg by mouth as needed.   Current Outpatient Medications (Other):    Calcium-Phosphorus-Vitamin D (CITRACAL +D3 PO), Take 2 tablets by mouth daily.   Cholecalciferol (D3-1000 PO), Take by mouth daily.   Clindamycin-Benzoyl Per, Refr, gel, Apply 1 g topically 2 (two) times daily.   Lutein-Zeaxanthin-Bilberry 20-4-40 MG CHEW, Chew by mouth daily.   Misc Natural Products (AIRBORNE ELDERBERRY) CHEW, Chew 2 each by mouth daily. Nature's Bounty Elderberry Gummies   Misc Natural Products (LUTEIN 20 PO), Take 1 each by mouth daily.   Multiple Vitamins-Minerals (CENTRUM ADULTS PO), Take by mouth.   Reviewed prior external information including notes and imaging from  primary care provider As well as notes that were available from care everywhere and other healthcare systems.  Past medical history, social, surgical and family history all reviewed in electronic medical record.  Sydney [name] pertanent information unless stated regarding to the chief complaint.   Review of Systems:  Sydney [name]  headache, visual changes, nausea, vomiting, diarrhea, constipation, dizziness, abdominal pain, skin rash, fevers, chills, night sweats, weight loss, swollen lymph nodes, body aches, joint swelling, chest pain, shortness of breath, mood changes. POSITIVE muscle aches  Objective  Blood pressure 132/88, pulse 89, height $RemoveBe'5\' 3"'ymvkfzTDT$  (1.6 m), weight 180 lb (81.6 kg), last menstrual period 07/21/2015, SpO2 97 %.   General: Sydney [name] apparent distress alert and oriented x3 mood and affect normal, dressed appropriately.  HEENT: Pupils equal, extraocular movements intact  Respiratory: Patient's speak in full sentences and does not appear short of breath  Cardiovascular: Sydney [name]  lower extremity edema, non tender, Sydney [name] erythema  Mild antalgic gait still noted. Patient's right ankle nontender on exam.  Sydney [name] swelling noted.  Good range of motion and good strength.  Patient's left wrist tender to palpation over the [facility] joint.  Positive grind test noted.  Limited muscular skeletal ultrasound was performed and interpreted by Sydney Sydney [name], M  Limited ultrasound of the [facility] joint does show the patient does have calcific changes consistent with arthritic changes.  Hypoechoic changes with increasing Doppler flow consistent with acute inflammation.  Achilles does seem to be significantly better with significant decrease in hypoechoic changes, still some mild calcific changes at the insertion.  The knee Doppler flow seems to be in the superficial aspect. Impression: Interval improvement of the Achilles with continued [facility] joint arthritis   Impression and Recommendations:    The above documentation has been reviewed and is accurate and complete Lyndal Pulley, DO

## 2022-04-16 ENCOUNTER — Ambulatory Visit: Payer: Self-pay

## 2022-04-16 ENCOUNTER — Ambulatory Visit: Payer: 59 | Admitting: Family Medicine

## 2022-04-16 ENCOUNTER — Encounter: Payer: Self-pay | Admitting: Family Medicine

## 2022-04-16 VITALS — BP 132/88 | HR 89 | Ht 63.0 in | Wt 180.0 lb

## 2022-04-16 DIAGNOSIS — M79671 Pain in right foot: Secondary | ICD-10-CM

## 2022-04-16 DIAGNOSIS — M25532 Pain in left wrist: Secondary | ICD-10-CM

## 2022-04-16 DIAGNOSIS — M7661 Achilles tendinitis, right leg: Secondary | ICD-10-CM | POA: Diagnosis not present

## 2022-04-16 NOTE — Assessment & Plan Note (Signed)
Patient is doing significantly better at this time.  Discussed soft tissues as well.  That is where most of the inflammation is at this point.  I do believe that this could be secondary to the brace that she continues to wear and will start wearing it less frequently.  Patient can intermittently see me to check in every 3 months but if worsening symptoms to come back sooner.

## 2022-04-16 NOTE — Assessment & Plan Note (Signed)
We will continue irritation of the [facility] joint.  Arthritic changes that are fairly significant for patient's age.  Discussed icing regimen and home exercises, discussed continuing bracing more at night.  Follow-up again 6 to 8 weeks

## 2022-04-16 NOTE — Patient Instructions (Signed)
Wear brace at night on the hand Ankle looks the best it has Ok to swim and increase activity See me again in 3 months

## 2022-06-03 DIAGNOSIS — L309 Dermatitis, unspecified: Secondary | ICD-10-CM | POA: Diagnosis not present

## 2022-06-03 DIAGNOSIS — L609 Nail disorder, unspecified: Secondary | ICD-10-CM | POA: Diagnosis not present

## 2022-06-24 ENCOUNTER — Encounter: Payer: Self-pay | Admitting: Internal Medicine

## 2022-06-24 ENCOUNTER — Ambulatory Visit: Payer: 59 | Admitting: Internal Medicine

## 2022-06-24 VITALS — BP 144/84 | HR 95 | Temp 98.8°F | Ht 63.0 in | Wt 175.4 lb

## 2022-06-24 DIAGNOSIS — M7661 Achilles tendinitis, right leg: Secondary | ICD-10-CM

## 2022-06-24 DIAGNOSIS — I10 Essential (primary) hypertension: Secondary | ICD-10-CM | POA: Diagnosis not present

## 2022-06-24 DIAGNOSIS — R3129 Other microscopic hematuria: Secondary | ICD-10-CM | POA: Diagnosis not present

## 2022-06-24 DIAGNOSIS — E785 Hyperlipidemia, unspecified: Secondary | ICD-10-CM

## 2022-06-24 DIAGNOSIS — R739 Hyperglycemia, unspecified: Secondary | ICD-10-CM

## 2022-06-24 DIAGNOSIS — Z862 Personal history of diseases of the blood and blood-forming organs and certain disorders involving the immune mechanism: Secondary | ICD-10-CM

## 2022-06-24 LAB — CBC WITH DIFFERENTIAL/PLATELET
Basophils Absolute: 0.1 10*3/uL (ref 0.0–0.1)
Basophils Relative: 1.4 % (ref 0.0–3.0)
Eosinophils Absolute: 0.3 10*3/uL (ref 0.0–0.7)
Eosinophils Relative: 7 % — ABNORMAL HIGH (ref 0.0–5.0)
HCT: 36.3 % (ref 36.0–46.0)
Hemoglobin: 12.1 g/dL (ref 12.0–15.0)
Lymphocytes Relative: 19.4 % (ref 12.0–46.0)
Lymphs Abs: 0.9 10*3/uL (ref 0.7–4.0)
MCHC: 33.3 g/dL (ref 30.0–36.0)
MCV: 89.1 fl (ref 78.0–100.0)
Monocytes Absolute: 0.4 10*3/uL (ref 0.1–1.0)
Monocytes Relative: 8.8 % (ref 3.0–12.0)
Neutro Abs: 2.8 10*3/uL (ref 1.4–7.7)
Neutrophils Relative %: 63.4 % (ref 43.0–77.0)
Platelets: 204 10*3/uL (ref 150.0–400.0)
RBC: 4.07 Mil/uL (ref 3.87–5.11)
RDW: 13.7 % (ref 11.5–15.5)
WBC: 4.5 10*3/uL (ref 4.0–10.5)

## 2022-06-24 LAB — LIPID PANEL
Cholesterol: 207 mg/dL — ABNORMAL HIGH (ref 0–200)
HDL: 51.5 mg/dL (ref >39.00)
LDL Cholesterol: 137 mg/dL — ABNORMAL HIGH (ref 0–99)
NonHDL: 155.78
Total CHOL/HDL Ratio: 4
Triglycerides: 93 mg/dL (ref 0.0–149.0)
VLDL: 18.6 mg/dL (ref 0.0–40.0)

## 2022-06-24 LAB — BASIC METABOLIC PANEL
BUN: 21 mg/dL (ref 6–23)
CO2: 31 mEq/L (ref 19–32)
Calcium: 9 mg/dL (ref 8.4–10.5)
Chloride: 102 mEq/L (ref 96–112)
Creatinine, Ser: 0.59 mg/dL (ref 0.40–1.20)
GFR: 101.1 mL/min (ref >60.00)
Glucose, Bld: 82 mg/dL (ref 70–99)
Potassium: 3.8 mEq/L (ref 3.5–5.1)
Sodium: 139 mEq/L (ref 135–145)

## 2022-06-24 LAB — HEPATIC FUNCTION PANEL
ALT: 15 U/L (ref 0–35)
AST: 19 U/L (ref 0–37)
Albumin: 4.1 g/dL (ref 3.5–5.2)
Alkaline Phosphatase: 63 U/L (ref 39–117)
Bilirubin, Direct: 0.1 mg/dL (ref 0.0–0.3)
Total Bilirubin: 0.5 mg/dL (ref 0.2–1.2)
Total Protein: 6.6 g/dL (ref 6.0–8.3)

## 2022-06-24 LAB — HEMOGLOBIN A1C: Hgb A1c MFr Bld: 5.9 % (ref 4.6–6.5)

## 2022-06-24 LAB — TSH: TSH: 1.69 u[IU]/mL (ref 0.35–5.50)

## 2022-06-24 NOTE — Progress Notes (Signed)
Patient ID: Sydney [name], female   DOB: 15-Jun-1966, 56 y.o.   MRN: [id number]   Subjective:    Patient ID: Sydney [name], female    DOB: [birth year], 56 y.o.   MRN: [id number]   Patient here for  Chief Complaint  Patient presents with   Establish Care   .   HPI Here to establish care.  History of hypertension and hypercholesterolemia.  Blood pressure elevated today. Reports does not take hctz daily.  May average 3x/week.  History of achilles tendinitis - has seen Dr Tamala Julian.  PT.  Previously had issues with facial numbness/left arm. Previous MRI.  Some headaches.  Glasses make her feel "woozy". Better with contacts.  No chest pain or sob reported.  No abdominal pain.  Reports bowels - lighter color.  Last wellness exam 08/28/22.  Reports PAP ok.  S/p left dental implant.    Past Medical History:  Diagnosis Date   Allergy    Anemia    Eczema    GERD (gastroesophageal reflux disease)    Hematuria    Hyperlipidemia    Hypertension    Kidney stone on left side    Lump in female breast    seen by Dr. Fleet Contras   Metabolic syndrome    Microscopic hematuria    Vitreous detachment of right eye 08/22/2019   Dr. George Ina,  [facility]   Past Surgical History:  Procedure Laterality Date   oral surgery  Left 10/2020   molar tooth implant    URETHRAL DILATION  Age 32   Family History  Problem Relation Age of Onset   Hypertension Mother    Hyperlipidemia Mother    Dementia Mother    Diabetes Father    CAD Father    Psoriasis Father    Heart disease Father    Cancer Father        Prostate and Multiple Myeloma   Asthma Sister    Thyroid disease Sister    Diabetes Sister    Breast cancer Neg Hx    Social History   Socioeconomic History   Marital status: Single    Spouse name: Not on file   Number of children: 0   Years of education: Not on file   Highest education level: Bachelor's degree (e.g., BA, AB, BS)  Occupational History    Employer: [employer]     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  Tobacco Use   Smoking status: Never   Smokeless tobacco: Never  Vaping Use   Vaping Use: Never used  Substance and Sexual Activity   Alcohol use: No    Alcohol/week: 0.0 standard drinks of alcohol   Drug use: No   Sexual activity: Not Currently  Other Topics Concern   Not on file  Social History Narrative   Works for [employer], lives alone   She has one cat   Mother is in memory care now    Social Determinants of Health   Financial Resource Strain: [place]  (08/28/2021)   Overall Financial Resource Strain (CARDIA)    Difficulty of Paying Living Expenses: Not hard at all  Food Insecurity: No Food Insecurity (08/28/2021)   Hunger Vital Sign    Worried About [place] in the Last Year: Never true    [place] in the Last Year: Never true  Transportation Needs: No Transportation Needs (08/28/2021)   PRAPARE - Transportation    Lack of Transportation (Medical): No    Lack of  Transportation (Non-Medical): No  Physical Activity: Inactive (08/28/2021)   Exercise Vital Sign    Days of Exercise per Week: 0 days    Minutes of Exercise per Session: 0 min  Stress: No Stress Concern Present (08/28/2021)   [place]    Feeling of Stress : Only a little  Social Connections: Moderately Integrated (08/28/2021)   Social Connection and Isolation Panel [NHANES]    Frequency of Communication with Friends and Family: Three times a week    Frequency of Social Gatherings with Friends and Family: Once a week    Attends Religious Services: More than 4 times per year    Active Member of [employer] or Organizations: Yes    Attends Archivist Meetings: 1 to 4 times per year    Marital Status: Never married     Review of Systems  Constitutional:  Negative for appetite change and unexpected weight change.  HENT:  Negative for congestion and sinus pressure.   Respiratory:   Negative for cough, chest tightness and shortness of breath.   Cardiovascular:  Negative for chest pain, palpitations and leg swelling.  Gastrointestinal:  Negative for abdominal pain, diarrhea, nausea and vomiting.  Genitourinary:  Negative for difficulty urinating and dysuria.  Musculoskeletal:  Negative for joint swelling and myalgias.  Skin:  Negative for color change and rash.  Neurological:  Negative for dizziness and light-headedness.       Some headaches reported.   Psychiatric/Behavioral:  Negative for agitation and dysphoric mood.        Increased stress - health of her cat.        Objective:     BP (!) 144/84 (BP Location: Left Arm, Patient Position: Sitting, Cuff Size: Normal)   Pulse 95   Temp 98.8 F (37.1 C) (Oral)   Ht $R'5\' 3"'lh$  (1.6 m)   Wt 175 lb 6.4 oz (79.6 kg)   LMP 07/21/2015 (Approximate)   SpO2 98%   BMI 31.07 kg/m  Wt Readings from Last 3 Encounters:  06/24/22 175 lb 6.4 oz (79.6 kg)  04/16/22 180 lb (81.6 kg)  03/12/22 164 lb (74.4 kg)    Physical Exam Vitals reviewed.  Constitutional:      General: She is not in acute distress.    Appearance: Normal appearance.  HENT:     Head: Normocephalic and atraumatic.     Right Ear: External ear normal.     Left Ear: External ear normal.  Eyes:     General: No scleral icterus.       Right eye: No discharge.        Left eye: No discharge.     Conjunctiva/sclera: Conjunctivae normal.  Neck:     Thyroid: No thyromegaly.  Cardiovascular:     Rate and Rhythm: Normal rate and regular rhythm.  Pulmonary:     Effort: No respiratory distress.     Breath sounds: Normal breath sounds. No wheezing.  Abdominal:     General: Bowel sounds are normal.     Palpations: Abdomen is soft.     Tenderness: There is no abdominal tenderness.  Musculoskeletal:        General: No swelling or tenderness.     Cervical back: Neck supple. No tenderness.  Lymphadenopathy:     Cervical: No cervical adenopathy.  Skin:     Findings: No erythema or rash.  Neurological:     Mental Status: She is alert.  Psychiatric:  Mood and Affect: Mood normal.        Behavior: Behavior normal.      Outpatient Encounter Medications as of 06/24/2022  Medication Sig   Calcium-Phosphorus-Vitamin D (CITRACAL +D3 PO) Take 2 tablets by mouth daily.   Cholecalciferol (D3-1000 PO) Take by mouth daily.   Clindamycin-Benzoyl Per, Refr, gel Apply 1 g topically 2 (two) times daily. (Patient taking differently: Apply 1 g topically 2 (two) times daily.)   hydrochlorothiazide (MICROZIDE) 12.5 MG capsule TAKE 1 CAPSULE BY MOUTH DAILY.   ibuprofen (ADVIL) 200 MG tablet Take 200 mg by mouth as needed.   Lutein-Zeaxanthin-Bilberry 20-4-40 MG CHEW Chew by mouth daily.   metoprolol succinate (TOPROL-XL) 25 MG 24 hr tablet Take 0.5 tablets (12.5 mg total) by mouth daily.   Misc Natural Products (AIRBORNE ELDERBERRY) CHEW Chew 2 each by mouth daily. Nature's Bounty Elderberry Gummies   Misc Natural Products (LUTEIN 20 PO) Take 1 each by mouth daily.   Multiple Vitamins-Minerals (CENTRUM ADULTS PO) Take by mouth.   No facility-administered encounter medications on file as of 06/24/2022.     Lab Results  Component Value Date   WBC 4.5 06/24/2022   HGB 12.1 06/24/2022   HCT 36.3 06/24/2022   PLT 204.0 06/24/2022   GLUCOSE 82 06/24/2022   CHOL 207 (H) 06/24/2022   TRIG 93.0 06/24/2022   HDL 51.50 06/24/2022   LDLCALC 137 (H) 06/24/2022   ALT 15 06/24/2022   AST 19 06/24/2022   NA 139 06/24/2022   K 3.8 06/24/2022   CL 102 06/24/2022   CREATININE 0.59 06/24/2022   BUN 21 06/24/2022   CO2 31 06/24/2022   TSH 1.69 06/24/2022   HGBA1C 5.9 06/24/2022    MM 3D SCREEN BREAST BILATERAL  Result Date: 02/19/2022 CLINICAL DATA:  Screening. EXAM: DIGITAL SCREENING BILATERAL MAMMOGRAM WITH TOMOSYNTHESIS AND CAD TECHNIQUE: Bilateral screening digital craniocaudal and mediolateral oblique mammograms were obtained. Bilateral screening  digital breast tomosynthesis was performed. The images were evaluated with computer-aided detection. COMPARISON:  Previous exam(s). ACR Breast Density Category b: There are scattered areas of fibroglandular density. FINDINGS: There are no findings suspicious for malignancy. IMPRESSION: No mammographic evidence of malignancy. A result letter of this screening mammogram will be mailed directly to the patient. RECOMMENDATION: Screening mammogram in one year. (Code:SM-B-01Y) BI-RADS CATEGORY  1: Negative. Electronically Signed   By: Dorise Bullion III M.D.   On: 02/19/2022 17:39      Assessment & Plan:   Problem List Items Addressed This Visit     Achilles tendinitis of right lower extremity    Has been seeing Dr Tamala Julian.  PT.        Benign hypertension - Primary    Blood pressure elevated above goal.  Continue current medications.  Have her take hctz daily.  Follow pressures.  Follow metabolic panel.  May need to add medication if blood pressure remains elevated.       Relevant Orders   Basic metabolic panel (Completed)   Dyslipidemia    Low cholesterol diet and exercise.  Follow lipid panel.       Relevant Orders   CBC with Differential/Platelet (Completed)   Lipid panel (Completed)   Hepatic function panel (Completed)   TSH (Completed)   Hematuria, microscopic    Previously saw Dr Erlene Quan.  She was initially seen last year for a microscopic hematuria workup in 05/2014 which demonstrated an incidental 2 mm left upper pole nonobstructing stone on CT urogram, cystoscopy was negative.  History of iron deficiency anemia    Follow cbc.       Hyperglycemia    Low carb diet and exercise.  Follow met b and A1c.       Relevant Orders   Hemoglobin A1c (Completed)     Einar Pheasant, MD

## 2022-07-02 ENCOUNTER — Ambulatory Visit
Admission: RE | Admit: 2022-07-02 | Discharge: 2022-07-02 | Disposition: A | Discharge status: Home / Self Care | Payer: PRIVATE HEALTH INSURANCE | Source: Ambulatory Visit | Attending: Physician Assistant | Admitting: Physician Assistant

## 2022-07-02 ENCOUNTER — Other Ambulatory Visit: Payer: Self-pay | Admitting: Physician Assistant

## 2022-07-02 DIAGNOSIS — S6992XA Unspecified injury of left wrist, hand and finger(s), initial encounter: Secondary | ICD-10-CM | POA: Diagnosis present

## 2022-07-05 ENCOUNTER — Encounter: Payer: Self-pay | Admitting: Internal Medicine

## 2022-07-05 NOTE — Assessment & Plan Note (Signed)
Has been seeing Dr Tamala Julian.  PT.

## 2022-07-05 NOTE — Assessment & Plan Note (Signed)
Follow cbc.  

## 2022-07-05 NOTE — Assessment & Plan Note (Signed)
Low cholesterol diet and exercise.  Follow lipid panel.   

## 2022-07-05 NOTE — Assessment & Plan Note (Signed)
Blood pressure elevated above goal.  Continue current medications.  Have her take hctz daily.  Follow pressures.  Follow metabolic panel.  May need to add medication if blood pressure remains elevated.

## 2022-07-05 NOTE — Assessment & Plan Note (Signed)
Previously saw Dr Erlene Quan.  She was initially seen last year for a microscopic hematuria workup in 05/2014 which demonstrated an incidental 2 mm left upper pole nonobstructing stone on CT urogram, cystoscopy was negative.

## 2022-07-05 NOTE — Assessment & Plan Note (Signed)
Low-carb diet and exercise.  Follow met b and A1c. ?

## 2022-07-07 ENCOUNTER — Telehealth: Payer: Self-pay | Admitting: Internal Medicine

## 2022-07-07 DIAGNOSIS — I10 Essential (primary) hypertension: Secondary | ICD-10-CM

## 2022-07-07 MED ORDER — HYDROCHLOROTHIAZIDE 12.5 MG PO CAPS
ORAL_CAPSULE | Freq: Every day | ORAL | 1 refills | Status: DC
  Filled 2022-07-07: qty 90, 90d supply, fill #0

## 2022-07-07 MED ORDER — METOPROLOL SUCCINATE ER 25 MG PO TB24
12.5000 mg | ORAL_TABLET | Freq: Every day | ORAL | 1 refills | Status: DC
  Filled 2022-07-07: qty 45, 90d supply, fill #0

## 2022-07-07 NOTE — Telephone Encounter (Signed)
Notify - rx for metoprolol and hctz sent in to [facility]

## 2022-07-07 NOTE — Telephone Encounter (Signed)
Needing new script under new provider name  hydrochlorothiazide (MICROZIDE) 12.5 MG capsule .  metoprolol succinate (TOPROL-XL) 25 MG 24 hr tablet

## 2022-07-07 NOTE — Telephone Encounter (Signed)
Rx ok'd for hctz and metoprolol.

## 2022-07-08 ENCOUNTER — Telehealth: Payer: Self-pay

## 2022-07-08 ENCOUNTER — Other Ambulatory Visit: Payer: Self-pay

## 2022-07-08 NOTE — Telephone Encounter (Signed)
Mychart sent to pt to advise rx's sent to [facility]

## 2022-07-15 NOTE — Progress Notes (Unsigned)
Sydney [name] Phone: [phone] Subjective:   Sydney [name], am serving as a scribe for Sydney [name].  I'm seeing this patient by the request  of:  Sydney Pheasant, MD  CC:   [id number]  04/16/2022 We will continue irritation of the [facility] joint.  Arthritic changes that are fairly significant for patient's age.  Discussed icing regimen and home exercises, discussed continuing bracing more at night.  Follow-up again 6 to 8 weeks     Patient is doing significantly better at this time.  Discussed soft tissues as well.  That is where most of the inflammation is at this point.  I do believe that this could be secondary to the brace that she continues to wear and will start wearing it less frequently.  Patient can intermittently see me to check in every 3 months but if worsening symptoms to come back sooner.      Update 07/16/2022 Sydney [name] is a 56 y.o. female coming in with complaint of L thumb and R ankle pain. Patient states that her thumb pain is improving but still bothers her at random times. R ankle pain is still doing well.  Hit L hand over middle MCP on a piece of furniture at work on Oct 30th. Saw her workman's comp doctor.       Past Medical History:  Diagnosis Date   Allergy    Anemia    Eczema    GERD (gastroesophageal reflux disease)    Hematuria    Hyperlipidemia    Hypertension    Kidney stone on left side    Lump in female breast    seen by Sydney [name]   Metabolic syndrome    Microscopic hematuria    Vitreous detachment of right eye 08/22/2019   Sydney [name],  [facility]   Past Surgical History:  Procedure Laterality Date   oral surgery  Left 10/2020   molar tooth implant    URETHRAL DILATION  Age 29   Social History   Socioeconomic History   Marital status: Single    Spouse name: Not on file   Number of children: 0   Years of education: Not on file    Highest education level: Bachelor's degree (e.g., BA, AB, BS)  Occupational History    Employer: [employer]    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  Tobacco Use   Smoking status: Never   Smokeless tobacco: Never  Vaping Use   Vaping Use: Never used  Substance and Sexual Activity   Alcohol use: [name]    Alcohol/week: 0.0 standard drinks of alcohol   Drug use: [name]   Sexual activity: Not Currently  Other Topics Concern   Not on file  Social History Narrative   Works for Sydney [name], lives alone   She has one cat   Mother is in memory care now    Social Determinants of Health   Financial Resource Strain: [place]  (08/28/2021)   Overall Financial Resource Strain (CARDIA)    Difficulty of Paying Living Expenses: Not hard at all  Food Insecurity: [name] Food Insecurity (08/28/2021)   Hunger Vital Sign    Worried About [place] in the Last Year: Never true    [place] in the Last Year: Never true  Transportation Needs: [name] Transportation Needs (08/28/2021)   PRAPARE - Transportation    Lack of Transportation (Medical): [name]    Lack  of Transportation (Non-Medical): [name]  Physical Activity: Inactive (08/28/2021)   Exercise Vital Sign    Days of Exercise per Week: 0 days    Minutes of Exercise per Session: 0 min  Stress: [name] Stress Concern Present (08/28/2021)   [place]    Feeling of Stress : Only a little  Social Connections: Moderately Integrated (08/28/2021)   Social Connection and Isolation Panel [NHANES]    Frequency of Communication with Friends and Family: Three times a week    Frequency of Social Gatherings with Friends and Family: Once a week    Attends Religious Services: More than 4 times per year    Active Member of [employer] or Organizations: Yes    Attends Music therapist: 1 to 4 times per year    Marital Status: Never married   Allergies  Allergen Reactions   Amoxicillin    Sulfa  Antibiotics    Family History  Problem Relation Age of Onset   Hypertension Mother    Hyperlipidemia Mother    Dementia Mother    Diabetes Father    CAD Father    Psoriasis Father    Heart disease Father    Cancer Father        Prostate and Multiple Myeloma   Asthma Sister    Thyroid disease Sister    Diabetes Sister    Breast cancer Neg Hx      Current Outpatient Medications (Cardiovascular):    hydrochlorothiazide (MICROZIDE) 12.5 MG capsule, TAKE 1 CAPSULE BY MOUTH DAILY.   metoprolol succinate (TOPROL-XL) 25 MG 24 hr tablet, Take 0.5 tablets (12.5 mg total) by mouth daily.   Current Outpatient Medications (Analgesics):    ibuprofen (ADVIL) 200 MG tablet, Take 200 mg by mouth as needed.   meloxicam (MOBIC) 15 MG tablet, Take 1 tablet (15 mg total) by mouth daily.   Current Outpatient Medications (Other):    Calcium-Phosphorus-Vitamin D (CITRACAL +D3 PO), Take 2 tablets by mouth daily.   Cholecalciferol (D3-1000 PO), Take by mouth daily.   Clindamycin-Benzoyl Per, Refr, gel, Apply 1 g topically 2 (two) times daily. (Patient taking differently: Apply 1 g topically 2 (two) times daily.)   Lutein-Zeaxanthin-Bilberry 20-4-40 MG CHEW, Chew by mouth daily.   Misc Natural Products (AIRBORNE ELDERBERRY) CHEW, Chew 2 each by mouth daily. Nature's Bounty Elderberry Gummies   Misc Natural Products (LUTEIN 20 PO), Take 1 each by mouth daily.   Multiple Vitamins-Minerals (CENTRUM ADULTS PO), Take by mouth.   Reviewed prior external information including notes and imaging from  primary care provider As well as notes that were available from care everywhere and other healthcare systems.  Past medical history, social, surgical and family history all reviewed in electronic medical record.  [name] pertanent information unless stated regarding to the chief complaint.   Review of Systems:  [name] headache, visual changes, nausea, vomiting, diarrhea, constipation, dizziness, abdominal pain, skin  rash, fevers, chills, night sweats, weight loss, swollen lymph nodes, body aches, joint swelling, chest pain, shortness of breath, mood changes. POSITIVE muscle aches  Objective  Blood pressure 132/84, pulse 88, height _0  (1.6 m), weight 174 lb (78.9 kg), last menstrual period 07/21/2015, SpO2 99 %.   General: [name] apparent distress alert and oriented x3 mood and affect normal, dressed appropriately.  HEENT: Pupils equal, extraocular movements intact  Respiratory: Patient's speak in full sentences and does not appear short of breath  Cardiovascular: [name] lower extremity edema, non tender, [name]  erythema  Patient still has a mild antalgic gait noted.  The left CMC does have a positive grind test noted.  [name] crepitus of the knee noted.  Left greater than right.  Valgus deformity of the knees bilaterally  Left CMC does have a positive grind test  Limited muscular skeletal ultrasound was performed and interpreted by Sydney [name], M   Limited ultrasound of the [facility] does show some hypoechoic changes noted.  The patient does have some narrowing of the joint space noted. Impression: CMC arthritis    Impression and Recommendations:     The above documentation has been reviewed and is accurate and complete Lyndal Pulley, DO

## 2022-07-16 ENCOUNTER — Ambulatory Visit: Payer: 59 | Admitting: Family Medicine

## 2022-07-16 ENCOUNTER — Other Ambulatory Visit: Payer: Self-pay

## 2022-07-16 ENCOUNTER — Encounter: Payer: Self-pay | Admitting: Family Medicine

## 2022-07-16 ENCOUNTER — Ambulatory Visit: Payer: Self-pay

## 2022-07-16 VITALS — BP 132/84 | HR 88 | Ht 63.0 in | Wt 174.0 lb

## 2022-07-16 DIAGNOSIS — M79645 Pain in left finger(s): Secondary | ICD-10-CM

## 2022-07-16 DIAGNOSIS — M7661 Achilles tendinitis, right leg: Secondary | ICD-10-CM | POA: Diagnosis not present

## 2022-07-16 DIAGNOSIS — G8929 Other chronic pain: Secondary | ICD-10-CM

## 2022-07-16 DIAGNOSIS — M222X2 Patellofemoral disorders, left knee: Secondary | ICD-10-CM | POA: Diagnosis not present

## 2022-07-16 DIAGNOSIS — M25532 Pain in left wrist: Secondary | ICD-10-CM | POA: Diagnosis not present

## 2022-07-16 MED ORDER — MELOXICAM 15 MG PO TABS
15.0000 mg | ORAL_TABLET | Freq: Every day | ORAL | 0 refills | Status: DC
  Filled 2022-07-16: qty 30, 30d supply, fill #0

## 2022-07-16 NOTE — Patient Instructions (Addendum)
Tinactin or lamisil Meloxicam '15mg'$  daily for 10 days then as needed in 5 day bursts Turmeric 500 mg daily  Tart cherry extract '1200mg'$  at night  See me in 2 months

## 2022-07-16 NOTE — Assessment & Plan Note (Signed)
Prescription for meloxicam given.  Chronic problem with worsening pain

## 2022-07-16 NOTE — Assessment & Plan Note (Signed)
Much improved at this time.  Discussed icing regimen and home exercises  This point I do not think any bracing is necessary.  Follow-up with me as needed

## 2022-07-16 NOTE — Assessment & Plan Note (Signed)
Does have the arthritic changes of the [facility] joint noted.  We discussed different treatment options including the possibility of injection.  Patient will continue with some bracing at night.  Discussed bracing at this time.  Increase activity as tolerated.  We will follow-up again in 6 to 8 weeks worsening pain highly would recommend an injection.

## 2022-07-17 ENCOUNTER — Ambulatory Visit: Payer: 59 | Admitting: Family Medicine

## 2022-07-27 ENCOUNTER — Ambulatory Visit (INDEPENDENT_AMBULATORY_CARE_PROVIDER_SITE_OTHER): Payer: 59 | Admitting: Internal Medicine

## 2022-07-27 ENCOUNTER — Encounter: Payer: Self-pay | Admitting: Internal Medicine

## 2022-07-27 ENCOUNTER — Other Ambulatory Visit: Payer: Self-pay

## 2022-07-27 VITALS — BP 144/86 | HR 90 | Temp 98.1°F | Resp 17 | Ht 63.0 in | Wt 174.3 lb

## 2022-07-27 DIAGNOSIS — R739 Hyperglycemia, unspecified: Secondary | ICD-10-CM | POA: Diagnosis not present

## 2022-07-27 DIAGNOSIS — I10 Essential (primary) hypertension: Secondary | ICD-10-CM | POA: Diagnosis not present

## 2022-07-27 DIAGNOSIS — E7800 Pure hypercholesterolemia, unspecified: Secondary | ICD-10-CM

## 2022-07-27 DIAGNOSIS — E785 Hyperlipidemia, unspecified: Secondary | ICD-10-CM

## 2022-07-27 MED ORDER — TELMISARTAN 20 MG PO TABS
20.0000 mg | ORAL_TABLET | Freq: Every day | ORAL | 2 refills | Status: DC
  Filled 2022-07-27: qty 30, 30d supply, fill #0

## 2022-07-27 NOTE — Assessment & Plan Note (Signed)
Low-carb diet and exercise.  Follow met b and A1c. ?

## 2022-07-27 NOTE — Assessment & Plan Note (Signed)
Low cholesterol diet and exercise.  Follow lipid panel.   

## 2022-07-27 NOTE — Assessment & Plan Note (Addendum)
The 10-year ASCVD risk score (Arnett DK, et al., 2019) is: 5%   Values used to calculate the score:     Age: 56 years     Sex: Female     Is Non-Hispanic African American: No     Diabetic: No     Tobacco smoker: No     Systolic Blood Pressure: 063 mmHg     Is BP treated: Yes     HDL Cholesterol: 51.5 mg/dL     Total Cholesterol: 207 mg/dL  Low cholesterol diet and exercise.  Follow lipid panel.

## 2022-07-27 NOTE — Progress Notes (Signed)
Patient ID: LAURY HUIZAR, female   DOB: Oct 12, 1965, 56 y.o.   MRN: [id number]   Subjective:    Patient ID: Cyndi Lennert, female    DOB: [birth year], 56 y.o.   MRN: [id number]   Patient here for  Chief Complaint  Patient presents with   Hypertension   .   HPI Blood pressure elevated last visit.  Was not taking hctz daily.  Has started taking daily.  Blood pressure remains elevated.  Discussed diet adjustment - monitoring sodium intake.  Not taking meloxicam currently.  Seeing Dr Tamala Julian - hands.  Planning to start walking and water aerobics.  Increased stress.  Discussed.     Past Medical History:  Diagnosis Date   Allergy    Anemia    Eczema    GERD (gastroesophageal reflux disease)    Hematuria    Hyperlipidemia    Hypertension    Kidney stone on left side    Lump in female breast    seen by Dr. Fleet Contras   Metabolic syndrome    Microscopic hematuria    Vitreous detachment of right eye 08/22/2019   Dr. George Ina,  [facility]   Past Surgical History:  Procedure Laterality Date   oral surgery  Left 10/2020   molar tooth implant    URETHRAL DILATION  Age 72   Family History  Problem Relation Age of Onset   Hypertension Mother    Hyperlipidemia Mother    Dementia Mother    Diabetes Father    CAD Father    Psoriasis Father    Heart disease Father    Cancer Father        Prostate and Multiple Myeloma   Asthma Sister    Thyroid disease Sister    Diabetes Sister    Breast cancer Neg Hx    Social History   Socioeconomic History   Marital status: Single    Spouse name: Not on file   Number of children: 0   Years of education: Not on file   Highest education level: Bachelor's degree (e.g., BA, AB, BS)  Occupational History    Employer: [employer]    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  Tobacco Use   Smoking status: Never   Smokeless tobacco: Never  Vaping Use   Vaping Use: Never used  Substance and Sexual Activity   Alcohol use: No     Alcohol/week: 0.0 standard drinks of alcohol   Drug use: No   Sexual activity: Not Currently  Other Topics Concern   Not on file  Social History Narrative   Works for [employer], lives alone   She has one cat   Mother is in memory care now    Social Determinants of Health   Financial Resource Strain: [place]  (08/28/2021)   Overall Financial Resource Strain (CARDIA)    Difficulty of Paying Living Expenses: Not hard at all  Food Insecurity: No Food Insecurity (08/28/2021)   Hunger Vital Sign    Worried About Running Out of Food in the Last Year: Never true    [place] in the Last Year: Never true  Transportation Needs: No Transportation Needs (08/28/2021)   PRAPARE - Hydrologist (Medical): No    Lack of Transportation (Non-Medical): No  Physical Activity: Inactive (08/28/2021)   Exercise Vital Sign    Days of Exercise per Week: 0 days    Minutes of Exercise per Session: 0 min  Stress: No Stress  Concern Present (08/28/2021)   [place]    Feeling of Stress : Only a little  Social Connections: Moderately Integrated (08/28/2021)   Social Connection and Isolation Panel [NHANES]    Frequency of Communication with Friends and Family: Three times a week    Frequency of Social Gatherings with Friends and Family: Once a week    Attends Religious Services: More than 4 times per year    Active Member of [employer] or Organizations: Yes    Attends Archivist Meetings: 1 to 4 times per year    Marital Status: Never married     Review of Systems  Constitutional:  Negative for appetite change and unexpected weight change.  HENT:  Negative for congestion and sinus pressure.   Respiratory:  Negative for cough, chest tightness and shortness of breath.   Cardiovascular:  Negative for chest pain, palpitations and leg swelling.  Gastrointestinal:  Negative for abdominal pain, diarrhea,  nausea and vomiting.  Genitourinary:  Negative for difficulty urinating and dysuria.  Musculoskeletal:  Negative for myalgias.       Hand pain/swelling   Skin:  Negative for color change and rash.  Neurological:  Negative for dizziness, light-headedness and headaches.  Psychiatric/Behavioral:  Negative for agitation and dysphoric mood.        Objective:     BP (!) 144/86   Pulse 90   Temp 98.1 F (36.7 C) (Temporal)   Resp 17   Ht _0  (1.6 m)   Wt 174 lb 4.8 oz (79.1 kg)   LMP 07/21/2015 (Approximate)   SpO2 98%   BMI 30.88 kg/m  Wt Readings from Last 3 Encounters:  07/27/22 174 lb 4.8 oz (79.1 kg)  07/16/22 174 lb (78.9 kg)  06/24/22 175 lb 6.4 oz (79.6 kg)    Physical Exam Vitals reviewed.  Constitutional:      General: She is not in acute distress.    Appearance: Normal appearance.  HENT:     Head: Normocephalic and atraumatic.     Right Ear: External ear normal.     Left Ear: External ear normal.  Eyes:     General: No scleral icterus.       Right eye: No discharge.        Left eye: No discharge.     Conjunctiva/sclera: Conjunctivae normal.  Neck:     Thyroid: No thyromegaly.  Cardiovascular:     Rate and Rhythm: Normal rate and regular rhythm.  Pulmonary:     Effort: No respiratory distress.     Breath sounds: Normal breath sounds. No wheezing.  Abdominal:     General: Bowel sounds are normal.     Palpations: Abdomen is soft.     Tenderness: There is no abdominal tenderness.  Musculoskeletal:        General: No swelling or tenderness.     Cervical back: Neck supple. No tenderness.  Lymphadenopathy:     Cervical: No cervical adenopathy.  Skin:    Findings: No erythema or rash.  Neurological:     Mental Status: She is alert.  Psychiatric:        Mood and Affect: Mood normal.        Behavior: Behavior normal.      Outpatient Encounter Medications as of 07/27/2022  Medication Sig   Calcium-Phosphorus-Vitamin D (CITRACAL +D3 PO) Take 2  tablets by mouth daily.   Cholecalciferol (D3-1000 PO) Take by mouth daily.   Clindamycin-Benzoyl Per,  Refr, gel Apply 1 g topically 2 (two) times daily. (Patient taking differently: Apply 1 g topically 2 (two) times daily.)   hydrochlorothiazide (MICROZIDE) 12.5 MG capsule TAKE 1 CAPSULE BY MOUTH DAILY.   ibuprofen (ADVIL) 200 MG tablet Take 200 mg by mouth as needed.   Lutein-Zeaxanthin-Bilberry 20-4-40 MG CHEW Chew by mouth daily.   meloxicam (MOBIC) 15 MG tablet Take 1 tablet (15 mg total) by mouth daily.   metoprolol succinate (TOPROL-XL) 25 MG 24 hr tablet Take 0.5 tablets (12.5 mg total) by mouth daily.   Misc Natural Products (AIRBORNE ELDERBERRY) CHEW Chew 2 each by mouth daily. Nature's Bounty Elderberry Gummies   Misc Natural Products (LUTEIN 20 PO) Take 1 each by mouth daily.   Multiple Vitamins-Minerals (CENTRUM ADULTS PO) Take by mouth.   TART CHERRY PO Take by mouth.   telmisartan (MICARDIS) 20 MG tablet Take 1 tablet (20 mg total) by mouth daily.   Turmeric (QC TUMERIC COMPLEX PO) Take by mouth.   No facility-administered encounter medications on file as of 07/27/2022.     Lab Results  Component Value Date   WBC 4.5 06/24/2022   HGB 12.1 06/24/2022   HCT 36.3 06/24/2022   PLT 204.0 06/24/2022   GLUCOSE 82 06/24/2022   CHOL 207 (H) 06/24/2022   TRIG 93.0 06/24/2022   HDL 51.50 06/24/2022   LDLCALC 137 (H) 06/24/2022   ALT 15 06/24/2022   AST 19 06/24/2022   NA 139 06/24/2022   K 3.8 06/24/2022   CL 102 06/24/2022   CREATININE 0.59 06/24/2022   BUN 21 06/24/2022   CO2 31 06/24/2022   TSH 1.69 06/24/2022   HGBA1C 5.9 06/24/2022    DG Hand Complete Left  Result Date: 07/02/2022 CLINICAL DATA:  Injury of left hand. Hit hand on a table. Knot in between the index and middle finger near the metacarpophalangeal joints with bruising and swelling to area. EXAM: LEFT HAND - COMPLETE 3+ VIEW COMPARISON:  Left wrist radiographs 01/28/2022 FINDINGS: Moderate thumb  carpometacarpal joint space narrowing with mild peripheral osteophytosis, similar to prior. No acute fracture is seen. No dislocation. IMPRESSION: 1. No acute fracture. 2. Moderate thumb carpometacarpal osteoarthritis. Electronically Signed   By: Yvonne Kendall M.D.   On: 07/02/2022 16:47       Assessment & Plan:   Problem List Items Addressed This Visit     Benign hypertension - Primary    Blood pressure remains elevated.  On hctz and taking daily.  Also taking metoprolol daily.  Add micardis 95m q day.  Follow pressures.  Follow metabolic panel.  Check met b 10-14 day after starting.  Get her back in soon to reassess.       Relevant Medications   telmisartan (MICARDIS) 20 MG tablet   Other Relevant Orders   Basic metabolic panel   Dyslipidemia    Low cholesterol diet and exercise.  Follow lipid panel.       Hypercholesteremia    The 10-year ASCVD risk score (Arnett DK, et al., 2019) is: 5%   Values used to calculate the score:     Age: [age]years     Sex: Female     Is Non-Hispanic African American: No     Diabetic: No     Tobacco smoker: No     Systolic Blood Pressure: 1885mmHg     Is BP treated: Yes     HDL Cholesterol: 51.5 mg/dL     Total Cholesterol: 207 mg/dL  Low cholesterol  diet and exercise.  Follow lipid panel.       Relevant Medications   telmisartan (MICARDIS) 20 MG tablet   Hyperglycemia    Low carb diet and exercise.  Follow met b and A1c.         Einar Pheasant, MD

## 2022-07-27 NOTE — Assessment & Plan Note (Signed)
Blood pressure remains elevated.  On hctz and taking daily.  Also taking metoprolol daily.  Add micardis 63m q day.  Follow pressures.  Follow metabolic panel.  Check met b 10-14 day after starting.  Get her back in soon to reassess.

## 2022-08-05 DIAGNOSIS — L609 Nail disorder, unspecified: Secondary | ICD-10-CM | POA: Diagnosis not present

## 2022-08-05 DIAGNOSIS — L309 Dermatitis, unspecified: Secondary | ICD-10-CM | POA: Diagnosis not present

## 2022-08-05 DIAGNOSIS — L290 Pruritus ani: Secondary | ICD-10-CM | POA: Diagnosis not present

## 2022-08-05 DIAGNOSIS — L304 Erythema intertrigo: Secondary | ICD-10-CM | POA: Diagnosis not present

## 2022-08-06 ENCOUNTER — Encounter: Payer: Self-pay | Admitting: Internal Medicine

## 2022-08-06 NOTE — Telephone Encounter (Signed)
Pt advised Keep appt 12/11

## 2022-08-06 NOTE — Telephone Encounter (Signed)
I certainly do not mind an appt to discuss her blood pressure and medication if she has concerns.

## 2022-08-10 ENCOUNTER — Encounter: Payer: Self-pay | Admitting: Internal Medicine

## 2022-08-10 ENCOUNTER — Other Ambulatory Visit: Payer: Self-pay

## 2022-08-10 ENCOUNTER — Other Ambulatory Visit (HOSPITAL_COMMUNITY)
Admission: RE | Admit: 2022-08-10 | Discharge: 2022-08-10 | Disposition: A | Discharge status: Home / Self Care | Payer: 59 | Source: Ambulatory Visit | Attending: Internal Medicine | Admitting: Internal Medicine

## 2022-08-10 ENCOUNTER — Ambulatory Visit (INDEPENDENT_AMBULATORY_CARE_PROVIDER_SITE_OTHER): Payer: 59 | Admitting: Internal Medicine

## 2022-08-10 VITALS — BP 162/90 | HR 91 | Temp 98.0°F | Resp 17 | Ht 63.0 in | Wt 174.3 lb

## 2022-08-10 DIAGNOSIS — R300 Dysuria: Secondary | ICD-10-CM | POA: Diagnosis not present

## 2022-08-10 DIAGNOSIS — Z124 Encounter for screening for malignant neoplasm of cervix: Secondary | ICD-10-CM | POA: Diagnosis not present

## 2022-08-10 DIAGNOSIS — R21 Rash and other nonspecific skin eruption: Secondary | ICD-10-CM

## 2022-08-10 DIAGNOSIS — E7800 Pure hypercholesterolemia, unspecified: Secondary | ICD-10-CM | POA: Diagnosis not present

## 2022-08-10 DIAGNOSIS — N898 Other specified noninflammatory disorders of vagina: Secondary | ICD-10-CM | POA: Diagnosis not present

## 2022-08-10 DIAGNOSIS — N760 Acute vaginitis: Secondary | ICD-10-CM | POA: Diagnosis not present

## 2022-08-10 DIAGNOSIS — R739 Hyperglycemia, unspecified: Secondary | ICD-10-CM | POA: Diagnosis not present

## 2022-08-10 DIAGNOSIS — I10 Essential (primary) hypertension: Secondary | ICD-10-CM

## 2022-08-10 MED ORDER — AMLODIPINE BESYLATE 5 MG PO TABS
5.0000 mg | ORAL_TABLET | Freq: Every day | ORAL | 3 refills | Status: DC
  Filled 2022-08-10: qty 30, 30d supply, fill #0
  Filled 2022-09-04: qty 30, 30d supply, fill #1
  Filled 2022-10-05: qty 30, 30d supply, fill #2
  Filled 2022-11-07: qty 30, 30d supply, fill #3

## 2022-08-10 MED ORDER — NYSTATIN 100000 UNIT/GM EX CREA
1.0000 | TOPICAL_CREAM | Freq: Two times a day (BID) | CUTANEOUS | 0 refills | Status: DC
  Filled 2022-08-10: qty 30, 15d supply, fill #0

## 2022-08-10 NOTE — Progress Notes (Signed)
Patient ID: Sydney [name], female   DOB: [birth year], 56 y.o.   MRN: [id number]   Subjective:    Patient ID: Sydney [name], female    DOB: 05-29-1966, 56 y.o.   MRN: [id number]   Patient here for  Chief Complaint  Patient presents with   Hypertension   .   HPI Here as work in to discuss her blood pressure and new medication prescribed.  Is concerned about taking the currently prescribed blood pressure medication.  Would like to change to a different medication.  Taking hctz and metoprolol.  Also reports noticing one day last week - dysuria.  Some vaginal itching.  Noticed pink on toilet paper when wiped.  No other bleeding.  No abdominal pain or cramping.  Eating.  No nausea or vomiting reported.  LMP  >1 year.     Past Medical History:  Diagnosis Date   Allergy    Anemia    Eczema    GERD (gastroesophageal reflux disease)    Hematuria    Hyperlipidemia    Hypertension    Kidney stone on left side    Lump in female breast    seen by Dr. Fleet Contras   Metabolic syndrome    Microscopic hematuria    Vitreous detachment of right eye 08/22/2019   Dr. George Ina,  [facility]   Past Surgical History:  Procedure Laterality Date   oral surgery  Left 10/2020   molar tooth implant    URETHRAL DILATION  Age 77   Family History  Problem Relation Age of Onset   Hypertension Mother    Hyperlipidemia Mother    Dementia Mother    Diabetes Father    CAD Father    Psoriasis Father    Heart disease Father    Cancer Father        Prostate and Multiple Myeloma   Asthma Sister    Thyroid disease Sister    Diabetes Sister    Breast cancer Neg Hx    Social History   Socioeconomic History   Marital status: Single    Spouse name: Not on file   Number of children: 0   Years of education: Not on file   Highest education level: Bachelor's degree (e.g., BA, AB, BS)  Occupational History    Employer: [employer]    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  Tobacco Use    Smoking status: Never   Smokeless tobacco: Never  Vaping Use   Vaping Use: Never used  Substance and Sexual Activity   Alcohol use: No    Alcohol/week: 0.0 standard drinks of alcohol   Drug use: No   Sexual activity: Not Currently  Other Topics Concern   Not on file  Social History Narrative   Works for [employer], lives alone   She has one cat   Mother is in memory care now    Social Determinants of Health   Financial Resource Strain: [place]  (08/28/2021)   Overall Financial Resource Strain (CARDIA)    Difficulty of Paying Living Expenses: Not hard at all  Food Insecurity: No Food Insecurity (08/28/2021)   Hunger Vital Sign    Worried About Running Out of Food in the Last Year: Never true    [place] in the Last Year: Never true  Transportation Needs: No Transportation Needs (08/28/2021)   PRAPARE - Hydrologist (Medical): No    Lack of Transportation (Non-Medical): No  Physical Activity: Inactive (  08/28/2021)   Exercise Vital Sign    Days of Exercise per Week: 0 days    Minutes of Exercise per Session: 0 min  Stress: No Stress Concern Present (08/28/2021)   [place]    Feeling of Stress : Only a little  Social Connections: Moderately Integrated (08/28/2021)   Social Connection and Isolation Panel [NHANES]    Frequency of Communication with Friends and Family: Three times a week    Frequency of Social Gatherings with Friends and Family: Once a week    Attends Religious Services: More than 4 times per year    Active Member of [employer] or Organizations: Yes    Attends Archivist Meetings: 1 to 4 times per year    Marital Status: Never married     Review of Systems  Constitutional:  Negative for appetite change and unexpected weight change.  HENT:  Negative for congestion and sinus pressure.   Respiratory:  Negative for cough, chest tightness and shortness of  breath.   Cardiovascular:  Negative for chest pain, palpitations and leg swelling.  Gastrointestinal:  Negative for abdominal pain, diarrhea, nausea and vomiting.  Genitourinary:        One day last week - dysuria.  Pink on toilet paper x 1.  Vaginal itching.   Musculoskeletal:  Negative for joint swelling and myalgias.  Skin:  Negative for color change and rash.  Neurological:  Negative for dizziness and headaches.  Psychiatric/Behavioral:  Negative for agitation and dysphoric mood.        Objective:     BP (!) 162/90 (BP Location: Left Arm, Patient Position: Sitting, Cuff Size: Large)   Pulse 91   Temp 98 F (36.7 C) (Temporal)   Resp 17   Ht _0  (1.6 m)   Wt 174 lb 4.8 oz (79.1 kg)   LMP 07/21/2015 (Approximate)   SpO2 97%   BMI 30.88 kg/m  Wt Readings from Last 3 Encounters:  08/10/22 174 lb 4.8 oz (79.1 kg)  07/27/22 174 lb 4.8 oz (79.1 kg)  07/16/22 174 lb (78.9 kg)    Physical Exam Vitals reviewed.  Constitutional:      General: She is not in acute distress.    Appearance: Normal appearance.  HENT:     Head: Normocephalic and atraumatic.     Right Ear: External ear normal.     Left Ear: External ear normal.  Eyes:     General: No scleral icterus.       Right eye: No discharge.        Left eye: No discharge.     Conjunctiva/sclera: Conjunctivae normal.  Neck:     Thyroid: No thyromegaly.  Cardiovascular:     Rate and Rhythm: Normal rate and regular rhythm.  Pulmonary:     Effort: No respiratory distress.     Breath sounds: Normal breath sounds. No wheezing.  Abdominal:     General: Bowel sounds are normal.     Palpations: Abdomen is soft.     Tenderness: There is no abdominal tenderness.  Genitourinary:    Comments: Normal external genitalia.  Minimal erythema - external vagina. Vaginal vault without lesions.  Cervix identified. PAP performed. Could not appreciate any adnexal masses or tenderness.  KOH/wet prept obtained.  Musculoskeletal:         General: No swelling or tenderness.     Cervical back: Neck supple. No tenderness.  Lymphadenopathy:     Cervical: No cervical  adenopathy.  Skin:    Findings: No erythema or rash.  Neurological:     Mental Status: She is alert.  Psychiatric:        Mood and Affect: Mood normal.        Behavior: Behavior normal.      Outpatient Encounter Medications as of 08/10/2022  Medication Sig   amLODipine (NORVASC) 5 MG tablet Take 1 tablet (5 mg total) by mouth daily.   Calcium-Phosphorus-Vitamin D (CITRACAL +D3 PO) Take 2 tablets by mouth daily.   Cholecalciferol (D3-1000 PO) Take by mouth daily.   hydrochlorothiazide (MICROZIDE) 12.5 MG capsule TAKE 1 CAPSULE BY MOUTH DAILY.   ibuprofen (ADVIL) 200 MG tablet Take 200 mg by mouth as needed.   Lutein-Zeaxanthin-Bilberry 20-4-40 MG CHEW Chew by mouth daily.   meloxicam (MOBIC) 15 MG tablet Take 1 tablet (15 mg total) by mouth daily.   metoprolol succinate (TOPROL-XL) 25 MG 24 hr tablet Take 0.5 tablets (12.5 mg total) by mouth daily.   Misc Natural Products (AIRBORNE ELDERBERRY) CHEW Chew 2 each by mouth daily. Nature's Bounty Elderberry Gummies   Misc Natural Products (LUTEIN 20 PO) Take 1 each by mouth daily.   Multiple Vitamins-Minerals (CENTRUM ADULTS PO) Take by mouth.   nystatin cream (MYCOSTATIN) Apply 1 Application topically 2 (two) times daily.   TART CHERRY PO Take by mouth.   Turmeric (QC TUMERIC COMPLEX PO) Take by mouth.   [DISCONTINUED] Clindamycin-Benzoyl Per, Refr, gel Apply 1 g topically 2 (two) times daily. (Patient taking differently: Apply 1 g topically 2 (two) times daily.)   [DISCONTINUED] telmisartan (MICARDIS) 20 MG tablet Take 1 tablet (20 mg total) by mouth daily. (Patient not taking: Reported on 08/10/2022)   No facility-administered encounter medications on file as of 08/10/2022.     Lab Results  Component Value Date   WBC 4.5 06/24/2022   HGB 12.1 06/24/2022   HCT 36.3 06/24/2022   PLT 204.0 06/24/2022    GLUCOSE 82 06/24/2022   CHOL 207 (H) 06/24/2022   TRIG 93.0 06/24/2022   HDL 51.50 06/24/2022   LDLCALC 137 (H) 06/24/2022   ALT 15 06/24/2022   AST 19 06/24/2022   NA 139 06/24/2022   K 3.8 06/24/2022   CL 102 06/24/2022   CREATININE 0.59 06/24/2022   BUN 21 06/24/2022   CO2 31 06/24/2022   TSH 1.69 06/24/2022   HGBA1C 5.9 06/24/2022    DG Hand Complete Left  Result Date: 07/02/2022 CLINICAL DATA:  Injury of left hand. Hit hand on a table. Knot in between the index and middle finger near the metacarpophalangeal joints with bruising and swelling to area. EXAM: LEFT HAND - COMPLETE 3+ VIEW COMPARISON:  Left wrist radiographs 01/28/2022 FINDINGS: Moderate thumb carpometacarpal joint space narrowing with mild peripheral osteophytosis, similar to prior. No acute fracture is seen. No dislocation. IMPRESSION: 1. No acute fracture. 2. Moderate thumb carpometacarpal osteoarthritis. Electronically Signed   By: Yvonne Kendall M.D.   On: 07/02/2022 16:47       Assessment & Plan:   Problem List Items Addressed This Visit     Benign hypertension    Blood pressure remains elevated.  On hctz and taking daily.  Also taking metoprolol daily.  Previously prescribed micardis 22m q day.  She does not want to take micardis.  Will start amlodipine 598mq day.  Follow pressures.  Follow metabolic panel.        Relevant Medications   amLODipine (NORVASC) 5 MG tablet   Dysuria -  Primary    No symptoms currently.  Check urine and culture to confirm if infection present.  Hold on abx until culture returns.        Relevant Orders   POCT Urinalysis Dipstick   Urine Microscopic Only (Completed)   Urine Culture (Completed)   Hypercholesteremia    The 10-year ASCVD risk score (Arnett DK, et al., 2019) is: 5.2%   Values used to calculate the score:     Age: 42 years     Sex: Female     Is Non-Hispanic African American: No     Diabetic: No     Tobacco smoker: No     Systolic Blood Pressure: 093 mmHg      Is BP treated: Yes     HDL Cholesterol: 51.5 mg/dL     Total Cholesterol: 207 mg/dL  Low cholesterol diet and exercise.  Follow lipid panel.       Relevant Medications   amLODipine (NORVASC) 5 MG tablet   Hyperglycemia    Low carb diet and exercise.  Follow met b and A1c.       Rash    Under breast.  Nystatin cream.       Vaginal itching    KOH/wet prep - obtained.  Nystatin cream - outer vaginal irritation.        Other Visit Diagnoses     Screening for cervical cancer       Relevant Orders   Cytology - PAP( [place]) (Completed)   Acute vaginitis       Relevant Orders   Cervicovaginal ancillary only( [place]) (Completed)        Einar Pheasant, MD

## 2022-08-11 LAB — URINALYSIS, MICROSCOPIC ONLY: RBC / HPF: NONE SEEN (ref 0–?)

## 2022-08-11 LAB — URINE CULTURE
MICRO NUMBER:: 14297268
SPECIMEN QUALITY:: ADEQUATE

## 2022-08-12 LAB — CERVICOVAGINAL ANCILLARY ONLY
Bacterial Vaginitis (gardnerella): NEGATIVE
Candida Glabrata: NEGATIVE
Candida Vaginitis: NEGATIVE
Chlamydia: NEGATIVE
Comment: NEGATIVE
Comment: NEGATIVE
Comment: NEGATIVE
Comment: NEGATIVE
Comment: NEGATIVE
Comment: NORMAL
Neisseria Gonorrhea: NEGATIVE
Trichomonas: NEGATIVE

## 2022-08-14 LAB — CYTOLOGY - PAP
Comment: NEGATIVE
Diagnosis: NEGATIVE
High risk HPV: NEGATIVE

## 2022-08-19 ENCOUNTER — Encounter: Payer: Self-pay | Admitting: Internal Medicine

## 2022-08-19 NOTE — Telephone Encounter (Signed)
Please confirm with her if the cream has helped at all.  What specific symptoms is she having now?   Any change in symptoms?

## 2022-08-20 NOTE — Telephone Encounter (Signed)
Have her keep the area dry as possible.  Would recommend continuing the cream for now.  Let me know if persistent.

## 2022-08-21 ENCOUNTER — Other Ambulatory Visit: Payer: Self-pay

## 2022-08-21 DIAGNOSIS — L700 Acne vulgaris: Secondary | ICD-10-CM

## 2022-08-21 MED ORDER — CLINDAMYCIN PHOS-BENZOYL PEROX 1.2-5 % EX GEL
1.0000 g | Freq: Two times a day (BID) | CUTANEOUS | 2 refills | Status: AC
  Filled 2022-08-21: qty 45, 30d supply, fill #0

## 2022-08-25 ENCOUNTER — Encounter: Payer: Self-pay | Admitting: Internal Medicine

## 2022-08-25 DIAGNOSIS — R21 Rash and other nonspecific skin eruption: Secondary | ICD-10-CM | POA: Insufficient documentation

## 2022-08-25 DIAGNOSIS — R300 Dysuria: Secondary | ICD-10-CM | POA: Insufficient documentation

## 2022-08-25 DIAGNOSIS — N898 Other specified noninflammatory disorders of vagina: Secondary | ICD-10-CM | POA: Insufficient documentation

## 2022-08-25 NOTE — Assessment & Plan Note (Signed)
Blood pressure remains elevated.  On hctz and taking daily.  Also taking metoprolol daily.  Previously prescribed micardis '20mg'$  q day.  She does not want to take micardis.  Will start amlodipine '5mg'$  q day.  Follow pressures.  Follow metabolic panel.

## 2022-08-25 NOTE — Assessment & Plan Note (Signed)
No symptoms currently.  Check urine and culture to confirm if infection present.  Hold on abx until culture returns.

## 2022-08-25 NOTE — Assessment & Plan Note (Signed)
KOH/wet prep - obtained.  Nystatin cream - outer vaginal irritation.

## 2022-08-25 NOTE — Assessment & Plan Note (Signed)
Low carb diet and exercise.  Follow met b and A1c.

## 2022-08-25 NOTE — Assessment & Plan Note (Signed)
The 10-year ASCVD risk score (Arnett DK, et al., 2019) is: 5.2%   Values used to calculate the score:     Age: 56 years     Sex: Female     Is Non-Hispanic African American: No     Diabetic: No     Tobacco smoker: No     Systolic Blood Pressure: 599 mmHg     Is BP treated: Yes     HDL Cholesterol: 51.5 mg/dL     Total Cholesterol: 207 mg/dL  Low cholesterol diet and exercise.  Follow lipid panel.

## 2022-08-25 NOTE — Assessment & Plan Note (Signed)
Under breast.  Nystatin cream.

## 2022-08-28 ENCOUNTER — Other Ambulatory Visit: Payer: 59

## 2022-08-28 ENCOUNTER — Other Ambulatory Visit: Payer: Self-pay

## 2022-08-28 MED ORDER — NYSTATIN 100000 UNIT/GM EX CREA
1.0000 | TOPICAL_CREAM | Freq: Two times a day (BID) | CUTANEOUS | 0 refills | Status: DC
  Filled 2022-08-28: qty 30, 15d supply, fill #0

## 2022-09-03 ENCOUNTER — Encounter: Payer: 59 | Admitting: Family Medicine

## 2022-09-10 ENCOUNTER — Other Ambulatory Visit: Payer: Self-pay

## 2022-09-14 NOTE — Progress Notes (Signed)
[facility] [place] Phone: [phone] Subjective:   Rito Ehrlich, am serving as a scribe for Dr. Hulan Saas.  I'm seeing this patient by the request  of:  Einar Pheasant, MD  CC: Ankle pain, hand pain follow-up  ION:[id number]  07/16/2022 Prescription for meloxicam given.  Chronic problem with worsening pain   Does have the arthritic changes of the [facility] joint noted.  We discussed different treatment options including the possibility of injection.  Patient will continue with some bracing at night.  Discussed bracing at this time.  Increase activity as tolerated.  We will follow-up again in 6 to 8 weeks worsening pain highly would recommend an injection.     Much improved at this time.  Discussed icing regimen and home exercises     Update 09/17/2022 AYRIANNA MCGINNISS is a 57 y.o. female coming in with complaint of L wrist and L knee pain. Patient states left wrist is doing pretty good. Still gets some pain, but wants to know what type of glove to get that is safe and when is she supposed to wear it since she wears her brace at night. States left knee "she is feeling"  knee is worse than last time. Right tendon was doing well but when she stepped over a pile of something she grazed it and now she has some pain. Patient wants to know if she could take the turmeric in the morning.       Past Medical History:  Diagnosis Date   Allergy    Anemia    Eczema    GERD (gastroesophageal reflux disease)    Hematuria    Hyperlipidemia    Hypertension    Kidney stone on left side    Lump in female breast    seen by Dr. Fleet Contras   Metabolic syndrome    Microscopic hematuria    Vitreous detachment of right eye 08/22/2019   Dr. 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  Tobacco Use   Smoking status: Never   Smokeless tobacco: Never  Vaping Use   Vaping Use: Never used  Substance and Sexual Activity   Alcohol use: No    Alcohol/week: 0.0 standard drinks of alcohol   Drug use: No   Sexual activity: Not Currently  Other Topics Concern   Not on file  Social History Narrative   Works for [employer], lives alone   She has one cat   Mother is in memory care now    Social Determinants of Health   Financial Resource Strain: Low Risk  (08/28/2021)   Overall Financial Resource Strain (CARDIA)    Difficulty of Paying Living Expenses: Not hard at all  Food Insecurity: No Food Insecurity (08/28/2021)   Hunger Vital Sign    Worried About Running Out of Food in the Last Year: Never true    [place] in the Last Year: Never true  Transportation  Needs: No Transportation Needs (08/28/2021)   PRAPARE - Hydrologist (Medical): No    Lack of Transportation (Non-Medical): No  Physical Activity: Inactive (08/28/2021)   Exercise Vital Sign    Days of Exercise per Week: 0 days    Minutes of Exercise per Session: 0 min  Stress: No Stress Concern Present (08/28/2021)   [place]    Feeling of Stress : Only a little  Social Connections: Moderately Integrated (08/28/2021)   Social Connection and Isolation Panel [NHANES]    Frequency of Communication with Friends and Family: Three times a week    Frequency of Social Gatherings with Friends and Family: Once a week    Attends Religious Services: More than 4 times per year    Active Member of [employer] or Organizations: Yes    Attends Arts development officer: 1 to 4 times per year    Marital Status: Never married   Allergies  Allergen Reactions   Amoxicillin    Sulfa Antibiotics    Family History  Problem Relation Age of Onset   Hypertension Mother    Hyperlipidemia Mother    Dementia Mother    Diabetes Father    CAD Father    Psoriasis Father    Heart disease Father    Cancer Father        Prostate and Multiple Myeloma   Asthma Sister    Thyroid disease Sister    Diabetes Sister    Breast cancer Neg Hx      Current Outpatient Medications (Cardiovascular):    amLODipine (NORVASC) 5 MG tablet, Take 1 tablet (5 mg total) by mouth daily.   hydrochlorothiazide (MICROZIDE) 12.5 MG capsule, TAKE 1 CAPSULE BY MOUTH DAILY.   metoprolol succinate (TOPROL-XL) 25 MG 24 hr tablet, Take 0.5 tablets (12.5 mg total) by mouth daily.   Current Outpatient Medications (Analgesics):    ibuprofen (ADVIL) 200 MG tablet, Take 200 mg by mouth as needed.   meloxicam (MOBIC) 15 MG tablet, Take 1 tablet (15 mg total) by mouth daily.   Current Outpatient Medications (Other):    Calcium-Phosphorus-Vitamin D (CITRACAL +D3 PO), Take 2 tablets by mouth daily.   Cholecalciferol (D3-1000 PO), Take by mouth daily.   Clindamycin-Benzoyl Per, Refr, gel, Apply to affected area 2 (two) times daily.   Lutein-Zeaxanthin-Bilberry 20-4-40 MG CHEW, Chew by mouth daily.   Misc Natural Products (AIRBORNE ELDERBERRY) CHEW, Chew 2 each by mouth daily. Nature's Bounty Elderberry Gummies   Misc Natural Products (LUTEIN 20 PO), Take 1 each by mouth daily.   Multiple Vitamins-Minerals (CENTRUM ADULTS PO), Take by mouth.   nystatin cream (MYCOSTATIN), Apply 1 Application topically 2 (two) times daily.   TART CHERRY PO, Take by mouth.   Turmeric (QC TUMERIC COMPLEX PO), Take by mouth.   Reviewed prior external information including notes and imaging from  primary care provider As well as notes that were available from care everywhere and other  healthcare systems.  Past medical history, social, surgical and family history all reviewed in electronic medical record.  No pertanent information unless stated regarding to the chief complaint.   Review of Systems:  No headache, visual changes, nausea, vomiting, diarrhea, constipation, dizziness, abdominal pain, skin rash, fevers, chills, night sweats, weight loss, swollen lymph nodes, body aches, joint swelling, chest pain, shortness of breath, mood changes. POSITIVE muscle aches  Objective  Blood pressure 130/82, pulse 82, height '5\' 3"'$  (1.6 m),  weight 176 lb (79.8 kg), last menstrual period 07/21/2015, SpO2 98 %.   General: No apparent distress alert and oriented x3 mood and affect normal, dressed appropriately.  HEENT: Pupils equal, extraocular movements intact  Respiratory: Patient's speak in full sentences and does not appear short of breath  Cardiovascular: No lower extremity edema, non tender, no erythema  Right knee exam does have some mild crepitus noted.  Some tenderness to palpation of the paraspinal musculature.  Patient's hands do have some mild swelling noted.  Positive grind test of the left thumb.  Some mild thenar eminence wasting noted as well. Ankle exam on the right side does have full range of motion noted.  Very minimal tenderness at the insertion of the Achilles. 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  Limited muscular skeletal ultrasound was performed and interpreted by Hulan Saas, M  Limited ultrasound of patient's right heel appears to have some very mild calcific changes but no significant hypoechoic changes that are concerning at the moment. Impression: No significant findings of any hypoechoic changes or new injury      Impression and Recommendations:     The above documentation has been reviewed and is accurate and complete Lyndal Pulley, DO

## 2022-09-17 ENCOUNTER — Encounter: Payer: Self-pay | Admitting: Family Medicine

## 2022-09-17 ENCOUNTER — Ambulatory Visit: Payer: Commercial Managed Care - PPO | Admitting: Family Medicine

## 2022-09-17 ENCOUNTER — Ambulatory Visit: Payer: Self-pay

## 2022-09-17 VITALS — BP 130/82 | HR 82 | Ht 63.0 in | Wt 176.0 lb

## 2022-09-17 DIAGNOSIS — M25532 Pain in left wrist: Secondary | ICD-10-CM | POA: Diagnosis not present

## 2022-09-17 DIAGNOSIS — M7661 Achilles tendinitis, right leg: Secondary | ICD-10-CM

## 2022-09-17 DIAGNOSIS — M1812 Unilateral primary osteoarthritis of first carpometacarpal joint, left hand: Secondary | ICD-10-CM

## 2022-09-17 DIAGNOSIS — M222X2 Patellofemoral disorders, left knee: Secondary | ICD-10-CM | POA: Diagnosis not present

## 2022-09-17 NOTE — Patient Instructions (Addendum)
Good to see you Can take the turmeric in the AM just watch for reflux Continue the toe regimen  New brace for the left knee with walking  Will hoold on hand brace See me again in 7-8 weeks

## 2022-09-17 NOTE — Assessment & Plan Note (Signed)
Known arthritic changes and does have a positive grind test on the left side.  Discussed with patient about different treatment options and patient has elected to try true pull lite brace which I think is going to be helpful for her.  Discussed icing regimen and home exercises otherwise.  Follow-up again in 6 to 8 weeks

## 2022-09-17 NOTE — Assessment & Plan Note (Signed)
No recent injury at this time.  Is doing better overall.  Discussed icing regimen and home exercises, which activities to do and which ones to avoid.  Follow-up with me 2 to 3 months

## 2022-09-17 NOTE — Assessment & Plan Note (Signed)
Arthritis of the wrist noted in the [facility] joint.  Discussed potential injections with patient declined.  Could do custom bracing if needed.  Patient will consider it.  Follow-up again in 6 to 8 weeks otherwise.

## 2022-10-02 ENCOUNTER — Other Ambulatory Visit: Payer: Self-pay

## 2022-10-02 ENCOUNTER — Ambulatory Visit: Payer: Commercial Managed Care - PPO | Admitting: Internal Medicine

## 2022-10-02 ENCOUNTER — Encounter: Payer: Self-pay | Admitting: Internal Medicine

## 2022-10-02 VITALS — BP 138/80 | HR 91 | Temp 97.7°F | Ht 63.0 in | Wt 175.6 lb

## 2022-10-02 DIAGNOSIS — M1812 Unilateral primary osteoarthritis of first carpometacarpal joint, left hand: Secondary | ICD-10-CM | POA: Diagnosis not present

## 2022-10-02 DIAGNOSIS — Z862 Personal history of diseases of the blood and blood-forming organs and certain disorders involving the immune mechanism: Secondary | ICD-10-CM | POA: Diagnosis not present

## 2022-10-02 DIAGNOSIS — R3129 Other microscopic hematuria: Secondary | ICD-10-CM | POA: Diagnosis not present

## 2022-10-02 DIAGNOSIS — R739 Hyperglycemia, unspecified: Secondary | ICD-10-CM | POA: Diagnosis not present

## 2022-10-02 DIAGNOSIS — I10 Essential (primary) hypertension: Secondary | ICD-10-CM

## 2022-10-02 DIAGNOSIS — E785 Hyperlipidemia, unspecified: Secondary | ICD-10-CM | POA: Diagnosis not present

## 2022-10-02 DIAGNOSIS — E7800 Pure hypercholesterolemia, unspecified: Secondary | ICD-10-CM | POA: Diagnosis not present

## 2022-10-02 DIAGNOSIS — R21 Rash and other nonspecific skin eruption: Secondary | ICD-10-CM | POA: Diagnosis not present

## 2022-10-02 MED ORDER — METOPROLOL SUCCINATE ER 25 MG PO TB24
12.5000 mg | ORAL_TABLET | Freq: Every day | ORAL | 1 refills | Status: DC
  Filled 2022-10-02: qty 45, 90d supply, fill #0

## 2022-10-02 MED ORDER — FLUCONAZOLE 150 MG PO TABS
ORAL_TABLET | ORAL | 0 refills | Status: DC
  Filled 2022-10-02: qty 2, fill #0

## 2022-10-02 MED ORDER — FLUCONAZOLE 150 MG PO TABS: ORAL_TABLET | ORAL | 0 refills | Status: DC

## 2022-10-02 NOTE — Progress Notes (Unsigned)
Subjective:    Patient ID: Sydney [name], female    DOB: 06/29/1966, 57 y.o.   MRN: [id number]  Patient here for  Chief Complaint  Patient presents with   Medical Management of Chronic Issues    HPI Here to follow up regarding her blood pressure and persistent rash.  Started amlodipine last visit.  Tolerating.  Blood pressure appears to be doing better.  She does not feel it has been as elevated.  Seeing Dr Tamala Julian for f/u wrist and ankle pain.  Recommended brace, stretches and ice.  No chest pain or sob reported.  No abdominal pain or bowel issues reported. Does report persistent rash - under left breast and peri vaginal area and around the rectum.     Past Medical History:  Diagnosis Date   Allergy    Anemia    Eczema    GERD (gastroesophageal reflux disease)    Hematuria    Hyperlipidemia    Hypertension    Kidney stone on left side    Lump in female breast    seen by Dr. Fleet Contras   Metabolic syndrome    Microscopic hematuria    Vitreous detachment of right eye 08/22/2019   Dr. George Ina,  [facility]   Past Surgical History:  Procedure Laterality Date   oral surgery  Left 10/2020   molar tooth implant    URETHRAL DILATION  Age 56   Family History  Problem Relation Age of Onset   Hypertension Mother    Hyperlipidemia Mother    Dementia Mother    Diabetes Father    CAD Father    Psoriasis Father    Heart disease Father    Cancer Father        Prostate and Multiple Myeloma   Asthma Sister    Thyroid disease Sister    Diabetes Sister    Breast cancer Neg Hx    Social History   Socioeconomic History   Marital status: Single    Spouse name: Not on file   Number of children: 0   Years of education: Not on file   Highest education level: Bachelor's degree (e.g., BA, AB, BS)  Occupational History    Employer:     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  Tobacco Use   Smoking status: Never   Smokeless tobacco: Never  Vaping Use    Vaping Use: Never used  Substance and Sexual Activity   Alcohol use: No    Alcohol/week: 0.0 standard drinks of alcohol   Drug use: No   Sexual activity: Not Currently  Other Topics Concern   Not on file  Social History Narrative   Works for [employer], lives alone   She has one cat   Mother is in memory care now    Social Determinants of Health   Financial Resource Strain: [place]  (08/28/2021)   Overall Financial Resource Strain (CARDIA)    Difficulty of Paying Living Expenses: Not hard at all  Food Insecurity: No Food Insecurity (08/28/2021)   Hunger Vital Sign    Worried About Running Out of Food in the Last Year: Never true    [place] in the Last Year: Never true  Transportation Needs: No Transportation Needs (08/28/2021)   PRAPARE - Hydrologist (Medical): No    Lack of Transportation (Non-Medical): No  Physical Activity: Inactive (08/28/2021)   Exercise Vital Sign    Days of Exercise per Week: 0 days  Minutes of Exercise per Session: 0 min  Stress: No Stress Concern Present (08/28/2021)   [place]    Feeling of Stress : Only a little  Social Connections: Moderately Integrated (08/28/2021)   Social Connection and Isolation Panel [NHANES]    Frequency of Communication with Friends and Family: Three times a week    Frequency of Social Gatherings with Friends and Family: Once a week    Attends Religious Services: More than 4 times per year    Active Member of [employer] or Organizations: Yes    Attends Archivist Meetings: 1 to 4 times per year    Marital Status: Never married     Review of Systems  Constitutional:  Negative for appetite change and unexpected weight change.  HENT:  Negative for congestion and sinus pressure.   Respiratory:  Negative for cough, chest tightness and shortness of breath.   Cardiovascular:  Negative for chest pain, palpitations and  leg swelling.  Gastrointestinal:  Negative for abdominal pain, diarrhea, nausea and vomiting.  Genitourinary:  Negative for difficulty urinating and dysuria.  Musculoskeletal:  Negative for myalgias.       Ankle issues as outlined.   Skin:  Positive for rash. Negative for wound.  Neurological:  Negative for dizziness, light-headedness and headaches.  Psychiatric/Behavioral:  Negative for agitation and dysphoric mood.        Objective:     BP 138/80   Pulse 91   Temp 97.7 F (36.5 C) (Oral)   Ht '5\' 3"'$  (1.6 m)   Wt 175 lb 9.6 oz (79.7 kg)   LMP 07/21/2015 (Approximate)   SpO2 98%   BMI 31.11 kg/m  Wt Readings from Last 3 Encounters:  10/02/22 175 lb 9.6 oz (79.7 kg)  09/17/22 176 lb (79.8 kg)  08/10/22 174 lb 4.8 oz (79.1 kg)    Physical Exam Vitals reviewed.  Constitutional:      General: She is not in acute distress.    Appearance: Normal appearance.  HENT:     Head: Normocephalic and atraumatic.     Right Ear: External ear normal.     Left Ear: External ear normal.  Eyes:     General: No scleral icterus.       Right eye: No discharge.        Left eye: No discharge.     Conjunctiva/sclera: Conjunctivae normal.  Neck:     Thyroid: No thyromegaly.  Cardiovascular:     Rate and Rhythm: Normal rate and regular rhythm.  Pulmonary:     Effort: No respiratory distress.     Breath sounds: Normal breath sounds. No wheezing.  Abdominal:     General: Bowel sounds are normal.     Palpations: Abdomen is soft.     Tenderness: There is no abdominal tenderness.  Musculoskeletal:        General: No swelling or tenderness.     Cervical back: Neck supple. No tenderness.  Lymphadenopathy:     Cervical: No cervical adenopathy.  Skin:    Comments: Erythematous rash under left breast - appears to be c/w yeast.  Peri vaginal erythema and erythema around the rectum.   Neurological:     Mental Status: She is alert.  Psychiatric:        Mood and Affect: Mood normal.         Behavior: Behavior normal.      Outpatient Encounter Medications as of 10/02/2022  Medication Sig  amLODipine (NORVASC) 5 MG tablet Take 1 tablet (5 mg total) by mouth daily.   Calcium-Phosphorus-Vitamin D (CITRACAL +D3 PO) Take 2 tablets by mouth daily.   Cholecalciferol (D3-1000 PO) Take by mouth daily.   Clindamycin-Benzoyl Per, Refr, gel Apply to affected area 2 (two) times daily.   fluconazole (DIFLUCAN) 150 MG tablet Take one tablet x 1 and then repeat x 1 in 3 days.   hydrochlorothiazide (MICROZIDE) 12.5 MG capsule TAKE 1 CAPSULE BY MOUTH DAILY.   ibuprofen (ADVIL) 200 MG tablet Take 200 mg by mouth as needed.   Lutein-Zeaxanthin-Bilberry 20-4-40 MG CHEW Chew by mouth daily.   meloxicam (MOBIC) 15 MG tablet Take 1 tablet (15 mg total) by mouth daily.   Misc Natural Products (AIRBORNE ELDERBERRY) CHEW Chew 2 each by mouth daily. Nature's Bounty Elderberry Gummies   Misc Natural Products (LUTEIN 20 PO) Take 1 each by mouth daily.   Multiple Vitamins-Minerals (CENTRUM ADULTS PO) Take by mouth.   nystatin cream (MYCOSTATIN) Apply 1 Application topically 2 (two) times daily.   TART CHERRY PO Take by mouth.   Turmeric (QC TUMERIC COMPLEX PO) Take by mouth.   [DISCONTINUED] fluconazole (DIFLUCAN) 150 MG tablet Take 1 tablet x 1 day and then repeat x 1 in 3 days.   [DISCONTINUED] metoprolol succinate (TOPROL-XL) 25 MG 24 hr tablet Take 0.5 tablets (12.5 mg total) by mouth daily.   metoprolol succinate (TOPROL-XL) 25 MG 24 hr tablet Take 0.5 tablets (12.5 mg total) by mouth daily.   No facility-administered encounter medications on file as of 10/02/2022.     Lab Results  Component Value Date   WBC 4.5 06/24/2022   HGB 12.1 06/24/2022   HCT 36.3 06/24/2022   PLT 204.0 06/24/2022   GLUCOSE 82 06/24/2022   CHOL 207 (H) 06/24/2022   TRIG 93.0 06/24/2022   HDL 51.50 06/24/2022   LDLCALC 137 (H) 06/24/2022   ALT 15 06/24/2022   AST 19 06/24/2022   NA 139 06/24/2022   K 3.8 06/24/2022    CL 102 06/24/2022   CREATININE 0.59 06/24/2022   BUN 21 06/24/2022   CO2 31 06/24/2022   TSH 1.69 06/24/2022   HGBA1C 5.9 06/24/2022    No results found.     Assessment & Plan:  Arthritis of carpometacarpal [facility]) joint of left thumb Assessment & Plan: Being followed by Dr Tamala Julian.  Note reviewed.    Benign hypertension Assessment & Plan: On hctz, metoprolol and amlodipine.  Desired not to take ace inhibitor or ARB.   Pressure is better.  Continue current medication regimen.  Follow pressures.  Follow metabolic panel.    Orders: -     Metoprolol Succinate ER; Take 0.5 tablets (12.5 mg total) by mouth daily.  Dispense: 45 tablet; Refill: 1  Dyslipidemia Assessment & Plan: Low cholesterol diet and exercise.  Follow lipid panel.    Hematuria, microscopic Assessment & Plan: Previously saw Dr Erlene Quan.  She was initially seen last year for a microscopic hematuria workup in 05/2014 which demonstrated an incidental 2 mm left upper pole nonobstructing stone on CT urogram, cystoscopy was negative.    History of iron deficiency anemia Assessment & Plan: Follow cbc.    Hypercholesteremia Assessment & Plan: The 10-year ASCVD risk score (Arnett DK, et al., 2019) is: 3.8%   Values used to calculate the score:     Age: 62 years     Sex: Female     Is Non-Hispanic African American: No     Diabetic: No  Tobacco smoker: No     Systolic Blood Pressure: 480 mmHg     Is BP treated: Yes     HDL Cholesterol: 51.5 mg/dL     Total Cholesterol: 207 mg/dL  Low cholesterol diet and exercise.  Follow lipid panel.    Hyperglycemia Assessment & Plan: Low carb diet and exercise.  Follow met b and A1c.    Rash Assessment & Plan: Persistent as outlined.  Appears to be c/w yeast.  Topical nystatin not helping.  Having the peri vaginal and peri rectal irritation as well.  Takes showers not baths.  Is not using wipes.  Does wear panty liners daily.  Sometimes waits to go to the bathroom  and pad will be wet.  Discussed emptying bladder regularly and not waiting to go to the bathroom.  If she can do this, will not need the pad.  Avoid perfume soaps.  Trial of diflucan.  Follow.  If persistent, may need gyn/dermatology evaluation.    Other orders -     Fluconazole; Take one tablet x 1 and then repeat x 1 in 3 days.  Dispense: 2 tablet; Refill: 0     Einar Pheasant, MD

## 2022-10-04 ENCOUNTER — Encounter: Payer: Self-pay | Admitting: Internal Medicine

## 2022-10-04 NOTE — Assessment & Plan Note (Signed)
Low-carb diet and exercise.  Follow met b and A1c. 

## 2022-10-04 NOTE — Assessment & Plan Note (Signed)
On hctz, metoprolol and amlodipine.  Desired not to take ace inhibitor or ARB.   Pressure is better.  Continue current medication regimen.  Follow pressures.  Follow metabolic panel.   

## 2022-10-04 NOTE — Assessment & Plan Note (Signed)
The 10-year ASCVD risk score (Arnett DK, et al., 2019) is: 3.8%   Values used to calculate the score:     Age: 57 years     Sex: Female     Is Non-Hispanic African American: No     Diabetic: No     Tobacco smoker: No     Systolic Blood Pressure: 188 mmHg     Is BP treated: Yes     HDL Cholesterol: 51.5 mg/dL     Total Cholesterol: 207 mg/dL  Low cholesterol diet and exercise.  Follow lipid panel.

## 2022-10-04 NOTE — Assessment & Plan Note (Signed)
Previously saw Dr Erlene Quan.  She was initially seen last year for a microscopic hematuria workup in 05/2014 which demonstrated an incidental 2 mm left upper pole nonobstructing stone on CT urogram, cystoscopy was negative.

## 2022-10-04 NOTE — Assessment & Plan Note (Signed)
Low cholesterol diet and exercise.  Follow lipid panel.   

## 2022-10-04 NOTE — Assessment & Plan Note (Signed)
Being followed by Dr Tamala Julian.  Note reviewed.

## 2022-10-04 NOTE — Assessment & Plan Note (Signed)
Persistent as outlined.  Appears to be c/w yeast.  Topical nystatin not helping.  Having the peri vaginal and peri rectal irritation as well.  Takes showers not baths.  Is not using wipes.  Does wear panty liners daily.  Sometimes waits to go to the bathroom and pad will be wet.  Discussed emptying bladder regularly and not waiting to go to the bathroom.  If she can do this, will not need the pad.  Avoid perfume soaps.  Trial of diflucan.  Follow.  If persistent, may need gyn/dermatology evaluation.

## 2022-10-04 NOTE — Assessment & Plan Note (Signed)
Follow cbc.  

## 2022-10-05 ENCOUNTER — Other Ambulatory Visit: Payer: Self-pay

## 2022-10-08 ENCOUNTER — Other Ambulatory Visit: Payer: Self-pay

## 2022-10-08 ENCOUNTER — Telehealth: Payer: Self-pay

## 2022-10-08 MED ORDER — FLUCONAZOLE 150 MG PO TABS
ORAL_TABLET | ORAL | 0 refills | Status: DC
  Filled 2022-10-08: qty 2, 3d supply, fill #0

## 2022-10-08 NOTE — Telephone Encounter (Signed)
Pt advised.

## 2022-10-08 NOTE — Addendum Note (Signed)
Addended by: Alisa Graff on: 10/08/2022 02:14 PM   Modules accepted: Orders

## 2022-10-08 NOTE — Telephone Encounter (Signed)
I sent in refill for difucan - take same directions  let me know if persistent problems.

## 2022-10-08 NOTE — Telephone Encounter (Signed)
Patient was given diflucan at her last appt with you for a yeast infection. She says that she just took her last one and symptoms are improving but not resolved. Wondering if she can have a refill to see if one more round of diflucan will clear it up.

## 2022-10-19 ENCOUNTER — Encounter: Payer: Self-pay | Admitting: Internal Medicine

## 2022-10-19 ENCOUNTER — Ambulatory Visit: Payer: Commercial Managed Care - PPO | Admitting: Internal Medicine

## 2022-10-19 ENCOUNTER — Other Ambulatory Visit: Payer: Self-pay

## 2022-10-19 VITALS — BP 134/70 | HR 83 | Resp 16 | Ht 63.0 in | Wt 178.3 lb

## 2022-10-19 DIAGNOSIS — I781 Nevus, non-neoplastic: Secondary | ICD-10-CM

## 2022-10-19 DIAGNOSIS — R21 Rash and other nonspecific skin eruption: Secondary | ICD-10-CM | POA: Diagnosis not present

## 2022-10-19 DIAGNOSIS — I10 Essential (primary) hypertension: Secondary | ICD-10-CM | POA: Diagnosis not present

## 2022-10-19 DIAGNOSIS — E785 Hyperlipidemia, unspecified: Secondary | ICD-10-CM

## 2022-10-19 MED ORDER — FLUCONAZOLE 100 MG PO TABS
100.0000 mg | ORAL_TABLET | ORAL | 0 refills | Status: DC
  Filled 2022-10-19: qty 4, 28d supply, fill #0

## 2022-10-19 NOTE — Progress Notes (Signed)
Subjective:    Patient ID: Sydney [name], female    DOB: 1966-07-28, 57 y.o.   MRN: [id number]  Patient here for  Chief Complaint  Patient presents with   Vaginitis    HPI Here as a work in appt.  Persistent rash - under breast and in peri vaginal area.  Is better.  Has had a couple of courses of diflucan - with noted improvement. Still persistent, but improved.  Tolerating blood pressure medication.  She is concerned regarding - left popliteal region - states feels it swells.  On evaluation, increased spider veins.  Discussed further evaluation.  Discussed compression hose.     Past Medical History:  Diagnosis Date   Allergy    Anemia    Eczema    GERD (gastroesophageal reflux disease)    Hematuria    Hyperlipidemia    Hypertension    Kidney stone on left side    Lump in female breast    seen by Dr. Fleet Contras   Metabolic syndrome    Microscopic hematuria    Vitreous detachment of right eye 08/22/2019   Dr. George Ina,  [facility]   Past Surgical History:  Procedure Laterality Date   oral surgery  Left 10/2020   molar tooth implant    URETHRAL DILATION  Age 4   Family History  Problem Relation Age of Onset   Hypertension Mother    Hyperlipidemia Mother    Dementia Mother    Diabetes Father    CAD Father    Psoriasis Father    Heart disease Father    Cancer Father        Prostate and Multiple Myeloma   Asthma Sister    Thyroid disease Sister    Diabetes Sister    Breast cancer Neg Hx    Social History   Socioeconomic History   Marital status: Single    Spouse name: Not on file   Number of children: 0   Years of education: Not on file   Highest education level: Bachelor's degree (e.g., BA, AB, BS)  Occupational History    Employer: [employer]    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  Tobacco Use   Smoking status: Never   Smokeless tobacco: Never  Vaping Use   Vaping Use: Never used  Substance and Sexual Activity   Alcohol use: No     Alcohol/week: 0.0 standard drinks of alcohol   Drug use: No   Sexual activity: Not Currently  Other Topics Concern   Not on file  Social History Narrative   Works for [employer], lives alone   She has one cat   Mother is in memory care now    Social Determinants of Health   Financial Resource Strain: [place]  (08/28/2021)   Overall Financial Resource Strain (CARDIA)    Difficulty of Paying Living Expenses: Not hard at all  Food Insecurity: No Food Insecurity (08/28/2021)   Hunger Vital Sign    Worried About Running Out of Food in the Last Year: Never true    [place] in the Last Year: Never true  Transportation Needs: No Transportation Needs (08/28/2021)   PRAPARE - Hydrologist (Medical): No    Lack of Transportation (Non-Medical): No  Physical Activity: Inactive (08/28/2021)   Exercise Vital Sign    Days of Exercise per Week: 0 days    Minutes of Exercise per Session: 0 min  Stress: No Stress Concern Present (08/28/2021)  [place] Questionnaire    Feeling of Stress : Only a little  Social Connections: Moderately Integrated (08/28/2021)   Social Connection and Isolation Panel [NHANES]    Frequency of Communication with Friends and Family: Three times a week    Frequency of Social Gatherings with Friends and Family: Once a week    Attends Religious Services: More than 4 times per year    Active Member of [employer] or Organizations: Yes    Attends Archivist Meetings: 1 to 4 times per year    Marital Status: Never married     Review of Systems  Constitutional:  Negative for appetite change and unexpected weight change.  HENT:  Negative for congestion and sinus pressure.   Respiratory:  Negative for cough, chest tightness and shortness of breath.   Cardiovascular:  Negative for chest pain and palpitations.  Gastrointestinal:  Negative for abdominal pain, diarrhea, nausea and  vomiting.  Genitourinary:  Negative for difficulty urinating and dysuria.  Musculoskeletal:  Negative for joint swelling and myalgias.  Skin:        Rash - improved - peri vaginal area and under breast.   Neurological:  Negative for dizziness and headaches.  Psychiatric/Behavioral:  Negative for agitation and dysphoric mood.        Objective:     BP 134/70   Pulse 83   Resp 16   Ht '5\' 3"'$  (1.6 m)   Wt 178 lb 4.8 oz (80.9 kg)   LMP 07/21/2015 (Approximate)   SpO2 98%   BMI 31.58 kg/m  Wt Readings from Last 3 Encounters:  10/19/22 178 lb 4.8 oz (80.9 kg)  10/02/22 175 lb 9.6 oz (79.7 kg)  09/17/22 176 lb (79.8 kg)    Physical Exam Vitals reviewed.  Constitutional:      General: She is not in acute distress.    Appearance: Normal appearance.  HENT:     Head: Normocephalic and atraumatic.     Right Ear: External ear normal.     Left Ear: External ear normal.  Eyes:     General: No scleral icterus.       Right eye: No discharge.        Left eye: No discharge.     Conjunctiva/sclera: Conjunctivae normal.  Neck:     Thyroid: No thyromegaly.  Cardiovascular:     Rate and Rhythm: Normal rate and regular rhythm.  Pulmonary:     Effort: No respiratory distress.     Breath sounds: Normal breath sounds. No wheezing.  Abdominal:     General: Bowel sounds are normal.     Palpations: Abdomen is soft.     Tenderness: There is no abdominal tenderness.  Musculoskeletal:        General: No swelling or tenderness.     Cervical back: Neck supple. No tenderness.  Lymphadenopathy:     Cervical: No cervical adenopathy.  Skin:    Comments: Minimal persistent erythema /rash under breast.  Improved.  Improved peri vaginal rash.  Minimal erythema.   Neurological:     Mental Status: She is alert.  Psychiatric:        Mood and Affect: Mood normal.        Behavior: Behavior normal.      Outpatient Encounter Medications as of 10/19/2022  Medication Sig   amLODipine (NORVASC) 5 MG  tablet Take 1 tablet (5 mg total) by mouth daily.   Calcium-Phosphorus-Vitamin D (CITRACAL +D3 PO) Take 2  tablets by mouth daily.   Cholecalciferol (D3-1000 PO) Take by mouth daily.   Clindamycin-Benzoyl Per, Refr, gel Apply to affected area 2 (two) times daily.   fluconazole (DIFLUCAN) 100 MG tablet Take 1 tablet (100 mg total) by mouth once a week x 4 weeks.   hydrochlorothiazide (MICROZIDE) 12.5 MG capsule TAKE 1 CAPSULE BY MOUTH DAILY.   ibuprofen (ADVIL) 200 MG tablet Take 200 mg by mouth as needed.   Lutein-Zeaxanthin-Bilberry 20-4-40 MG CHEW Chew by mouth daily.   meloxicam (MOBIC) 15 MG tablet Take 1 tablet (15 mg total) by mouth daily.   metoprolol succinate (TOPROL-XL) 25 MG 24 hr tablet Take 0.5 tablets (12.5 mg total) by mouth daily.   Misc Natural Products (AIRBORNE ELDERBERRY) CHEW Chew 2 each by mouth daily. Nature's Bounty Elderberry Gummies   Misc Natural Products (LUTEIN 20 PO) Take 1 each by mouth daily.   Multiple Vitamins-Minerals (CENTRUM ADULTS PO) Take by mouth.   TART CHERRY PO Take by mouth.   Turmeric (QC TUMERIC COMPLEX PO) Take by mouth.   nystatin cream (MYCOSTATIN) Apply 1 Application topically 2 (two) times daily. (Patient not taking: Reported on 10/19/2022)   [DISCONTINUED] fluconazole (DIFLUCAN) 150 MG tablet Take one tablet by month once, then repeat in 3 days (Patient not taking: Reported on 10/19/2022)   No facility-administered encounter medications on file as of 10/19/2022.     Lab Results  Component Value Date   WBC 4.5 06/24/2022   HGB 12.1 06/24/2022   HCT 36.3 06/24/2022   PLT 204.0 06/24/2022   GLUCOSE 82 06/24/2022   CHOL 207 (H) 06/24/2022   TRIG 93.0 06/24/2022   HDL 51.50 06/24/2022   LDLCALC 137 (H) 06/24/2022   ALT 15 06/24/2022   AST 19 06/24/2022   NA 139 06/24/2022   K 3.8 06/24/2022   CL 102 06/24/2022   CREATININE 0.59 06/24/2022   BUN 21 06/24/2022   CO2 31 06/24/2022   TSH 1.69 06/24/2022   HGBA1C 5.9 06/24/2022    No  results found.     Assessment & Plan:  Benign hypertension Assessment & Plan: On hctz, metoprolol and amlodipine.  Desired not to take ace inhibitor or ARB.   Pressure is better.  Continue current medication regimen.  Follow pressures.  Follow metabolic panel.     Dyslipidemia Assessment & Plan: Low cholesterol diet and exercise.  Follow lipid panel.    Rash Assessment & Plan: Rash under breast and peri vaginal rash - improved.  Will continue diflucan course to see if resolves.  Diflucan as directed.  Follow.  Notify if persistent.     Spider veins Assessment & Plan: Increased spider veins. Discussed venous insufficiency.  Discussed compression hose.  Request referral to [facility].   Orders: -     Ambulatory referral to Vascular Surgery  Other orders -     Fluconazole; Take 1 tablet (100 mg total) by mouth once a week x 4 weeks.  Dispense: 4 tablet; Refill: 0     Einar Pheasant, MD

## 2022-10-20 ENCOUNTER — Ambulatory Visit: Payer: Commercial Managed Care - PPO | Admitting: Internal Medicine

## 2022-10-25 ENCOUNTER — Encounter: Payer: Self-pay | Admitting: Internal Medicine

## 2022-10-25 DIAGNOSIS — I781 Nevus, non-neoplastic: Secondary | ICD-10-CM | POA: Insufficient documentation

## 2022-10-25 NOTE — Assessment & Plan Note (Signed)
On hctz, metoprolol and amlodipine.  Desired not to take ace inhibitor or ARB.   Pressure is better.  Continue current medication regimen.  Follow pressures.  Follow metabolic panel.

## 2022-10-25 NOTE — Assessment & Plan Note (Signed)
Increased spider veins. Discussed venous insufficiency.  Discussed compression hose.  Request referral to [facility].

## 2022-10-25 NOTE — Assessment & Plan Note (Signed)
Rash under breast and peri vaginal rash - improved.  Will continue diflucan course to see if resolves.  Diflucan as directed.  Follow.  Notify if persistent.

## 2022-10-25 NOTE — Assessment & Plan Note (Signed)
Low cholesterol diet and exercise.  Follow lipid panel.   

## 2022-10-25 NOTE — Addendum Note (Signed)
Addended by: Alisa Graff on: 10/25/2022 05:55 PM   Modules accepted: Level of Service

## 2022-11-12 ENCOUNTER — Encounter: Payer: Self-pay | Admitting: Family Medicine

## 2022-11-12 ENCOUNTER — Other Ambulatory Visit: Payer: Self-pay

## 2022-11-12 ENCOUNTER — Ambulatory Visit: Payer: Commercial Managed Care - PPO | Admitting: Family Medicine

## 2022-11-12 VITALS — BP 134/86 | HR 92 | Ht 63.0 in | Wt 177.0 lb

## 2022-11-12 DIAGNOSIS — G8929 Other chronic pain: Secondary | ICD-10-CM

## 2022-11-12 DIAGNOSIS — M25562 Pain in left knee: Secondary | ICD-10-CM

## 2022-11-12 DIAGNOSIS — M25532 Pain in left wrist: Secondary | ICD-10-CM | POA: Diagnosis not present

## 2022-11-12 DIAGNOSIS — M222X2 Patellofemoral disorders, left knee: Secondary | ICD-10-CM | POA: Diagnosis not present

## 2022-11-12 NOTE — Patient Instructions (Addendum)
MRI L knee [phone] We will be in touch See me in 6 weeks

## 2022-11-12 NOTE — Progress Notes (Signed)
[place] Phone: [phone] Subjective:   Sydney [name], am serving as a scribe for Dr. Hulan Saas.  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  Discussed potential injections with patient declined.  Could do custom bracing if needed.  Patient will consider it.  Follow-up again in 6 to 8 weeks otherwise.     Known arthritic changes and does have a positive grind test on the left side.  Discussed with patient about different treatment options and patient has elected to try true pull lite brace which I think is going to be helpful for her.  Discussed icing regimen and home exercises otherwise.  Follow-up again in 6 to 8 weeks     [name] recent injury at this time.  Is doing better overall.  Discussed icing regimen and home exercises, which activities to do and which ones to avoid.  Follow-up with me 2 to 3 months      Update 11/12/2022 Sydney [name] is a 57 y.o. female coming in with complaint of L wrist, L knee and L thumb pain. Patient states that her knee has been swollen for past 2 weeks. R ankle is starting to hurt due to compensatory patterns from her L knee pain. Is not wearing her brace but feels like her knee is unstable and "moving around a lot". Also experiencing pain radiating down the anterior aspect.   Seeing vascular tmrw to discuss if veins are causing swelling.   L thumb and wrist pain have improved.       Past Medical History:  Diagnosis Date   Allergy    Anemia    Eczema    GERD (gastroesophageal reflux disease)    Hematuria    Hyperlipidemia    Hypertension    Kidney stone on left side    Lump in female breast    seen by Dr. Fleet Contras   Metabolic syndrome    Microscopic hematuria    Vitreous detachment of right eye 08/22/2019   Dr. 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  Tobacco Use   Smoking status: Never   Smokeless tobacco: Never  Vaping Use   Vaping Use: Never used  Substance and Sexual Activity   Alcohol use: [name]    Alcohol/week: 0.0 standard drinks of alcohol   Drug use: [name]   Sexual activity: Not Currently  Other Topics Concern   Not on file  Social History Narrative   Works for [employer], lives alone   She has one cat   Mother is in memory care now    Social Determinants of Health   Financial Resource Strain: [place]  (08/28/2021)   Overall Financial Resource Strain (CARDIA)    Difficulty of Paying Living Expenses: Not hard at all  Food Insecurity: [name] Food  Insecurity (08/28/2021)   Hunger Vital Sign    Worried About Running Out of Food in the Last Year: Never true    [place] in the Last Year: Never true  Transportation Needs: [name] Transportation Needs (08/28/2021)   PRAPARE - Hydrologist (Medical): [name]    Lack of Transportation (Non-Medical): [name]  Physical Activity: Inactive (08/28/2021)   Exercise Vital Sign    Days of Exercise per Week: 0 days    Minutes of Exercise per Session: 0 min  Stress: [name] Stress Concern Present (08/28/2021)   [place]    Feeling of Stress : Only a little  Social Connections: Moderately Integrated (08/28/2021)   Social Connection and Isolation Panel [NHANES]    Frequency of Communication with Friends and Family: Three times a week    Frequency of Social Gatherings with  Friends and Family: Once a week    Attends Religious Services: More than 4 times per year    Active Member of [employer] or Organizations: Yes    Attends Music therapist: 1 to 4 times per year    Marital Status: Never married   Allergies  Allergen Reactions   Amoxicillin    Sulfa Antibiotics    Family History  Problem Relation Age of Onset   Hypertension Mother    Hyperlipidemia Mother    Dementia Mother    Diabetes Father    CAD Father    Psoriasis Father    Heart disease Father    Cancer Father        Prostate and Multiple Myeloma   Asthma Sister    Thyroid disease Sister    Diabetes Sister    Breast cancer Neg Hx      Current Outpatient Medications (Cardiovascular):    amLODipine (NORVASC) 5 MG tablet, Take 1 tablet (5 mg total) by mouth daily.   hydrochlorothiazide (MICROZIDE) 12.5 MG capsule, TAKE 1 CAPSULE BY MOUTH DAILY.   metoprolol succinate (TOPROL-XL) 25 MG 24 hr tablet, Take 0.5 tablets (12.5 mg total) by mouth daily.   Current Outpatient Medications (Analgesics):    ibuprofen (ADVIL) 200 MG tablet, Take 200 mg by mouth as needed.   meloxicam (MOBIC) 15 MG tablet, Take 1 tablet (15 mg total) by mouth daily.   Current Outpatient Medications (Other):    Calcium-Phosphorus-Vitamin D (CITRACAL +D3 PO), Take 2 tablets by mouth daily.   Cholecalciferol (D3-1000 PO), Take by mouth daily.   Clindamycin-Benzoyl Per, Refr, gel, Apply to affected area 2 (two) times daily.   fluconazole (DIFLUCAN) 100 MG tablet, Take 1 tablet (100 mg total) by mouth once a week x 4 weeks.   Lutein-Zeaxanthin-Bilberry 20-4-40 MG CHEW, Chew by mouth daily.   Misc Natural Products (AIRBORNE ELDERBERRY) CHEW, Chew 2 each by mouth daily. Nature's Bounty Elderberry Gummies   Misc Natural Products (LUTEIN 20 PO), Take 1 each by mouth daily.   Multiple Vitamins-Minerals (CENTRUM ADULTS PO), Take by mouth.   nystatin cream (MYCOSTATIN), Apply 1 Application topically 2 (two) times  daily.   TART CHERRY PO, Take by mouth.   Turmeric (QC TUMERIC COMPLEX PO), Take by mouth.   Reviewed prior external information including notes and imaging from  primary care provider As well as notes that were available from care everywhere and other healthcare systems.  Past medical history, social, surgical and family history all reviewed in electronic medical record.  [name] pertanent information unless stated regarding to  the chief complaint.   Review of Systems:  [name] headache, visual changes, nausea, vomiting, diarrhea, constipation, dizziness, abdominal pain, skin rash, fevers, chills, night sweats, weight loss, swollen lymph nodes, body aches, joint swelling, chest pain, shortness of breath, mood changes. POSITIVE muscle aches, joint swelling   Objective  Blood pressure 134/86, pulse 92, height '5\' 3"'$  (1.6 m), weight 177 lb (80.3 kg), last menstrual period 07/21/2015, SpO2 99 %.   General: [name] apparent distress alert and oriented x3 mood and affect normal, dressed appropriately.  HEENT: Pupils equal, extraocular movements intact  Respiratory: Patient's speak in full sentences and does not appear short of breath  Cardiovascular: [name] lower extremity edema, non tender, [name] erythema  Left knee does have some limited range of motion.  Significant effusion of the left knee noted.  Lateral tracking of the patella noted.  Crepitus noted with range of motion.  Positive McMurray's noted. Limited muscular skeletal ultrasound was performed and interpreted by Hulan Saas, M   Limited ultrasound shows that patient does have some abnormality noted of the medial meniscus but difficult to assess secondary to the severity of the effusion and the hypoechoic changes. Impression: Recurrent effusion of the knee questionable meniscal injury    Impression and Recommendations:     The above documentation has been reviewed and is accurate and complete Lyndal Pulley, DO

## 2022-11-12 NOTE — Assessment & Plan Note (Addendum)
Patient is showing signs of left-sided knee instability.  Worsening effusion also noted.  Because of the x-ray showing that there has been some mild arthritic changes. At this point continue decreasing the severity of symptoms oriented.  Patient has failed physical therapy, bracing, home exercises.  Will get advanced imaging and see if any surgical intervention may be necessary.

## 2022-11-13 ENCOUNTER — Ambulatory Visit (INDEPENDENT_AMBULATORY_CARE_PROVIDER_SITE_OTHER): Payer: Commercial Managed Care - PPO | Admitting: Vascular Surgery

## 2022-11-13 ENCOUNTER — Encounter (INDEPENDENT_AMBULATORY_CARE_PROVIDER_SITE_OTHER): Payer: Self-pay | Admitting: Vascular Surgery

## 2022-11-13 VITALS — BP 131/75 | HR 87 | Resp 16 | Wt 177.0 lb

## 2022-11-13 DIAGNOSIS — I10 Essential (primary) hypertension: Secondary | ICD-10-CM

## 2022-11-13 DIAGNOSIS — I83813 Varicose veins of bilateral lower extremities with pain: Secondary | ICD-10-CM | POA: Diagnosis not present

## 2022-11-13 DIAGNOSIS — M7989 Other specified soft tissue disorders: Secondary | ICD-10-CM | POA: Diagnosis not present

## 2022-11-13 NOTE — Assessment & Plan Note (Signed)
Recommend:  I have had a long discussion with the patient regarding swelling and why it  causes symptoms.  Patient will begin wearing graduated compression on a daily basis if he knee pain will allow it. The patient will  wear the stockings first thing in the morning and removing them in the evening. The patient is instructed specifically not to sleep in the stockings.   In addition, behavioral modification will be initiated.  This will include frequent elevation, use of over the counter pain medications and exercise such as walking.  Consideration for a lymph pump will also be made based upon the effectiveness of conservative therapy.  This would help to improve the edema control and prevent sequela such as ulcers and infections   Patient should undergo duplex ultrasound of the venous system to ensure that DVT or reflux is not present.  The patient will follow-up with me after the ultrasound.

## 2022-11-13 NOTE — Patient Instructions (Signed)
Chronic Venous Insufficiency Chronic venous insufficiency is a condition where the leg veins cannot effectively pump blood from the legs to the heart. This happens when the vein walls are either stretched, weakened, or damaged, or when the valves inside the vein are damaged. With the right treatment, you should be able to continue with an active life. This condition is also called venous stasis. What are the causes? Common causes of this condition include: High blood pressure inside the veins (venous hypertension). Sitting or standing too long, causing increased blood pressure in the leg veins. A blood clot that blocks blood flow in a vein (deep vein thrombosis, DVT). Inflammation of a vein (phlebitis) that causes a blood clot to form. Tumors in the pelvis that cause blood to back up. What increases the risk? The following factors may make you more likely to develop this condition: Having a family history of this condition. Obesity. Pregnancy. Living without enough regular physical activity or exercise (sedentary lifestyle). Smoking. Having a job that requires long periods of standing or sitting in one place. Being a certain age. Women in their 40s and 50s and men in their 70s are more likely to develop this condition. What are the signs or symptoms? Symptoms of this condition include: Veins that are enlarged, bulging, or twisted (varicose veins). Skin breakdown or ulcers. Reddened skin or dark discoloration of skin on the leg between the knee and ankle. Brown, smooth, tight, and painful skin just above the ankle, usually on the inside of the leg (lipodermatosclerosis). Swelling of the legs. How is this diagnosed? This condition may be diagnosed based on: Your medical history. A physical exam. Tests, such as: A procedure that creates an image of a blood vessel and nearby organs and provides information about blood flow through the blood vessel (duplex ultrasound). A procedure that  tests blood flow (plethysmography). A procedure that looks at the veins using X-ray and dye (venogram). How is this treated? The goals of treatment are to help you return to an active life and to minimize pain or disability. Treatment depends on the severity of your condition, and it may include: Wearing compression stockings. These can help relieve symptoms and help prevent your condition from getting worse. However, they do not cure the condition. Sclerotherapy. This procedure involves an injection of a solution that shrinks damaged veins. Surgery. This may involve: Removing a diseased vein (vein stripping). Cutting off blood flow through the vein (laser ablation surgery). Repairing or reconstructing a valve within the affected vein. Follow these instructions at home:     Wear compression stockings as told by your health care provider. These stockings help to prevent blood clots and reduce swelling in your legs. Take over-the-counter and prescription medicines only as told by your health care provider. Stay active by exercising, walking, or doing different activities. Ask your health care provider what activities are safe for you and how much exercise you need. Drink enough fluid to keep your urine pale yellow. Do not use any products that contain nicotine or tobacco, such as cigarettes, e-cigarettes, and chewing tobacco. If you need help quitting, ask your health care provider. Keep all follow-up visits as told by your health care provider. This is important. Contact a health care provider if you: Have redness, swelling, or more pain in the affected area. See a red streak or line that goes up or down from the affected area. Have skin breakdown or skin loss in the affected area, even if the breakdown is small. Get   an injury in the affected area. Get help right away if: You get an injury and an open wound in the affected area. You have: Severe pain that does not get better with  medicine. Sudden numbness or weakness in the foot or ankle below the affected area. Trouble moving your foot or ankle. A fever. Worse or persistent symptoms. Chest pain. Shortness of breath. Summary Chronic venous insufficiency is a condition where the leg veins cannot effectively pump blood from the legs to the heart. Chronic venous insufficiency occurs when the vein walls become stretched, weakened, or damaged, or when valves within the vein are damaged. Treatment depends on how severe your condition is. It often involves wearing compression stockings and may involve having a procedure. Make sure you stay active by exercising, walking, or doing different activities. Ask your health care provider what activities are safe for you and how much exercise you need. This information is not intended to replace advice given to you by your health care provider. Make sure you discuss any questions you have with your health care provider. Document Revised: 10/28/2020 Document Reviewed: 10/29/2020 Elsevier Patient Education  2023 Elsevier Inc.  

## 2022-11-13 NOTE — Assessment & Plan Note (Signed)
We discussed how venous insufficiency could be contributing to her lower extremity swelling.  We discussed the pathophysiology and natural history of venous disease.  Compression, elevation, and activities would be the hallmarks of conservative therapy and she will do these to the best of her abilities particularly given her left knee issues currently.  The venous reflux study has been ordered as described above.  Will see her back following the study to discuss the results and determine further treatment options.

## 2022-11-13 NOTE — Assessment & Plan Note (Signed)
blood pressure control important in reducing the progression of atherosclerotic disease. On appropriate oral medications.  

## 2022-11-13 NOTE — Progress Notes (Signed)
Patient ID: Sydney [name], female   DOB: [birth year], 57 y.o.   MRN: [id number]  Chief Complaint  Patient presents with   New Patient (Initial Visit)    Ref Nicki Reaper consult spider veins   HPI Sydney [name] is a 57 y.o. female.  I am asked to see the patient by Dr. Nicki Reaper for evaluation of leg swelling in association prominent varicose veins.  Patient has had superficial varicosities that have gradually worsened over the years.  Over the past couple of years, she has noticed more leg swelling.  The left leg swells more than the right.  She is also having problems with her left knee and is seeing an orthopedic surgeon and is scheduled to have an MRI in the near future.  Her swelling is most prominent at the end of the day.  She has tried compression socks without significant improvement and recently with her left knee pain, the compression socks are causing her pain due to the constriction on her sore knee.  She does try to elevate her legs.  She remains active and is losing weight.  No previous history of DVT or superficial thrombophlebitis to her knowledge.     Past Medical History:  Diagnosis Date   Allergy    Anemia    Eczema    GERD (gastroesophageal reflux disease)    Hematuria    Hyperlipidemia    Hypertension    Kidney stone on left side    Lump in female breast    seen by Dr. Fleet Contras   Metabolic syndrome    Microscopic hematuria    Vitreous detachment of right eye 08/22/2019   Dr. George Ina,  [facility]    Past Surgical History:  Procedure Laterality Date   oral surgery  Left 10/2020   molar tooth implant    URETHRAL DILATION  Age 45     Family History  Problem Relation Age of Onset   Hypertension Mother    Hyperlipidemia Mother    Dementia Mother    Diabetes Father    CAD Father    Psoriasis Father    Heart disease Father    Cancer Father        Prostate and Multiple Myeloma   Asthma Sister    Thyroid disease Sister    Diabetes Sister     Breast cancer Neg Hx       Social History   Tobacco Use   Smoking status: Never   Smokeless tobacco: Never  Vaping Use   Vaping Use: Never used  Substance Use Topics   Alcohol use: No    Alcohol/week: 0.0 standard drinks of alcohol   Drug use: No     Allergies  Allergen Reactions   Amoxicillin    Sulfa Antibiotics     Current Outpatient Medications  Medication Sig Dispense Refill   amLODipine (NORVASC) 5 MG tablet Take 1 tablet (5 mg total) by mouth daily. 30 tablet 3   Calcium-Phosphorus-Vitamin D (CITRACAL +D3 PO) Take 2 tablets by mouth daily.     Cholecalciferol (D3-1000 PO) Take by mouth daily.     Clindamycin-Benzoyl Per, Refr, gel Apply to affected area 2 (two) times daily. 45 g 2   fluconazole (DIFLUCAN) 100 MG tablet Take 1 tablet (100 mg total) by mouth once a week x 4 weeks. 4 tablet 0   hydrochlorothiazide (MICROZIDE) 12.5 MG capsule TAKE 1 CAPSULE BY MOUTH DAILY. 90 capsule 1   ibuprofen (ADVIL) 200 MG tablet Take  200 mg by mouth as needed.     Lutein-Zeaxanthin-Bilberry 20-4-40 MG CHEW Chew by mouth daily.     meloxicam (MOBIC) 15 MG tablet Take 1 tablet (15 mg total) by mouth daily. 30 tablet 0   metoprolol succinate (TOPROL-XL) 25 MG 24 hr tablet Take 0.5 tablets (12.5 mg total) by mouth daily. 45 tablet 1   Misc Natural Products (AIRBORNE ELDERBERRY) CHEW Chew 2 each by mouth daily. Nature's Bounty Elderberry Gummies     Misc Natural Products (LUTEIN 20 PO) Take 1 each by mouth daily.     Multiple Vitamins-Minerals (CENTRUM ADULTS PO) Take by mouth.     nystatin cream (MYCOSTATIN) Apply 1 Application topically 2 (two) times daily. 30 g 0   TART CHERRY PO Take by mouth.     Turmeric (QC TUMERIC COMPLEX PO) Take by mouth.     No current facility-administered medications for this visit.      REVIEW OF SYSTEMS (Negative unless checked)  Constitutional: [] Weight loss  [] Fever  [] Chills Cardiac: [] Chest pain   [] Chest pressure   [] Palpitations    [] Shortness of breath when laying flat   [] Shortness of breath at rest   [] Shortness of breath with exertion. Vascular:  [] Pain in legs with walking   [] Pain in legs at rest   [] Pain in legs when laying flat   [] Claudication   [] Pain in feet when walking  [] Pain in feet at rest  [] Pain in feet when laying flat   [] History of DVT   [] Phlebitis   [x] Swelling in legs   [x] Varicose veins   [] Non-healing ulcers Pulmonary:   [] Uses home oxygen   [] Productive cough   [] Hemoptysis   [] Wheeze  [] COPD   [] Asthma Neurologic:  [] Dizziness  [] Blackouts   [] Seizures   [] History of stroke   [] History of TIA  [] Aphasia   [] Temporary blindness   [] Dysphagia   [] Weakness or numbness in arms   [] Weakness or numbness in legs Musculoskeletal:  [] Arthritis   [] Joint swelling   [x] Joint pain   [] Low back pain Hematologic:  [] Easy bruising  [] Easy bleeding   [] Hypercoagulable state   [x] Anemic  [] Hepatitis Gastrointestinal:  [] Blood in stool   [] Vomiting blood  [x] Gastroesophageal reflux/heartburn   [] Abdominal pain Genitourinary:  [] Chronic kidney disease   [] Difficult urination  [] Frequent urination  [] Burning with urination   [x] Hematuria Skin:  [] Rashes   [] Ulcers   [] Wounds Psychological:  [] History of anxiety   []  History of major depression.    Physical Exam BP 131/75 (BP Location: Left Arm)   Pulse 87   Resp 16   Wt 177 lb (80.3 kg)   LMP 07/21/2015 (Approximate)   BMI 31.35 kg/m  Gen:  WD/WN, NAD Head: [place]/AT, No temporalis wasting.  Ear/Nose/Throat: Hearing grossly intact, nares w/o erythema or drainage, oropharynx w/o Erythema/Exudate Eyes: Conjunctiva clear, sclera non-icteric  Neck: trachea midline.  No JVD.  Pulmonary:  Good air movement, respirations not labored, no use of accessory muscles  Cardiac: RRR, no JVD Vascular:  Vessel Right Left  Radial Palpable Palpable                          DP Palpable  Palpable   PT Palpable  Palpable    Gastrointestinal:. No masses, surgical  incisions, or scars. Musculoskeletal: M/S 5/5 throughout.  Extremities without ischemic changes.  No deformity or atrophy.  Diffuse varicosities present bilaterally worse on the left than the right.  Trace right lower extremity  edema, 1+ left lower extremity edema.  The left knee is also visibly swollen. Neurologic: Sensation grossly intact in extremities.  Symmetrical.  Speech is fluent. Motor exam as listed above. Psychiatric: Judgment intact, Mood & affect appropriate for pt's clinical situation. Dermatologic: No rashes or ulcers noted.  No cellulitis or open wounds.    Radiology No results found.  Labs No results found for this or any previous visit (from the past 2160 hour(s)).  Assessment/Plan:  Swelling of limb Recommend:  I have had a long discussion with the patient regarding swelling and why it  causes symptoms.  Patient will begin wearing graduated compression on a daily basis if he knee pain will allow it. The patient will  wear the stockings first thing in the morning and removing them in the evening. The patient is instructed specifically not to sleep in the stockings.   In addition, behavioral modification will be initiated.  This will include frequent elevation, use of over the counter pain medications and exercise such as walking.  Consideration for a lymph pump will also be made based upon the effectiveness of conservative therapy.  This would help to improve the edema control and prevent sequela such as ulcers and infections   Patient should undergo duplex ultrasound of the venous system to ensure that DVT or reflux is not present.  The patient will follow-up with me after the ultrasound.   Benign hypertension blood pressure control important in reducing the progression of atherosclerotic disease. On appropriate oral medications.   Varicose veins of both lower extremities with pain We discussed how venous insufficiency could be contributing to her lower extremity  swelling.  We discussed the pathophysiology and natural history of venous disease.  Compression, elevation, and activities would be the hallmarks of conservative therapy and she will do these to the best of her abilities particularly given her left knee issues currently.  The venous reflux study has been ordered as described above.  Will see her back following the study to discuss the results and determine further treatment options.      Leotis Pain 11/13/2022, 10:40 AM   This note was created with Dragon medical transcription system.  Any errors from dictation are unintentional.

## 2022-12-03 ENCOUNTER — Ambulatory Visit
Admission: RE | Admit: 2022-12-03 | Discharge: 2022-12-03 | Disposition: A | Discharge status: Home / Self Care | Payer: Commercial Managed Care - PPO | Source: Ambulatory Visit | Attending: Family Medicine | Admitting: Family Medicine

## 2022-12-03 DIAGNOSIS — G8929 Other chronic pain: Secondary | ICD-10-CM

## 2022-12-03 DIAGNOSIS — S83272A Complex tear of lateral meniscus, current injury, left knee, initial encounter: Secondary | ICD-10-CM | POA: Diagnosis not present

## 2022-12-06 ENCOUNTER — Other Ambulatory Visit: Payer: Self-pay | Admitting: Internal Medicine

## 2022-12-07 ENCOUNTER — Other Ambulatory Visit: Payer: Self-pay | Admitting: Internal Medicine

## 2022-12-07 ENCOUNTER — Other Ambulatory Visit: Payer: Self-pay

## 2022-12-07 MED FILL — Amlodipine Besylate Tab 5 MG (Base Equivalent): ORAL | 30 days supply | Qty: 30 | Fill #0 | Status: AC

## 2022-12-08 ENCOUNTER — Other Ambulatory Visit: Payer: Self-pay

## 2022-12-10 ENCOUNTER — Ambulatory Visit (INDEPENDENT_AMBULATORY_CARE_PROVIDER_SITE_OTHER): Payer: Commercial Managed Care - PPO | Admitting: Internal Medicine

## 2022-12-10 ENCOUNTER — Other Ambulatory Visit: Payer: Self-pay

## 2022-12-10 ENCOUNTER — Encounter: Payer: Self-pay | Admitting: Internal Medicine

## 2022-12-10 VITALS — BP 128/80 | HR 86 | Temp 98.0°F | Resp 16 | Ht 63.0 in | Wt 178.2 lb

## 2022-12-10 DIAGNOSIS — M25562 Pain in left knee: Secondary | ICD-10-CM

## 2022-12-10 DIAGNOSIS — I10 Essential (primary) hypertension: Secondary | ICD-10-CM | POA: Diagnosis not present

## 2022-12-10 DIAGNOSIS — E7800 Pure hypercholesterolemia, unspecified: Secondary | ICD-10-CM

## 2022-12-10 DIAGNOSIS — R21 Rash and other nonspecific skin eruption: Secondary | ICD-10-CM | POA: Diagnosis not present

## 2022-12-10 DIAGNOSIS — R3129 Other microscopic hematuria: Secondary | ICD-10-CM | POA: Diagnosis not present

## 2022-12-10 DIAGNOSIS — R739 Hyperglycemia, unspecified: Secondary | ICD-10-CM

## 2022-12-10 DIAGNOSIS — Z862 Personal history of diseases of the blood and blood-forming organs and certain disorders involving the immune mechanism: Secondary | ICD-10-CM | POA: Diagnosis not present

## 2022-12-10 DIAGNOSIS — Z0000 Encounter for general adult medical examination without abnormal findings: Secondary | ICD-10-CM

## 2022-12-10 DIAGNOSIS — E2839 Other primary ovarian failure: Secondary | ICD-10-CM | POA: Diagnosis not present

## 2022-12-10 MED ORDER — HYDROCHLOROTHIAZIDE 12.5 MG PO CAPS
12.5000 mg | ORAL_CAPSULE | Freq: Every day | ORAL | 1 refills | Status: DC
  Filled 2022-12-10: qty 90, 90d supply, fill #0
  Filled 2023-05-14: qty 90, 90d supply, fill #1

## 2022-12-10 MED ORDER — METOPROLOL SUCCINATE ER 25 MG PO TB24
12.5000 mg | ORAL_TABLET | Freq: Every day | ORAL | 1 refills | Status: DC
  Filled 2022-12-10 – 2022-12-28 (×2): qty 45, 90d supply, fill #0
  Filled 2023-04-07: qty 45, 90d supply, fill #1

## 2022-12-10 NOTE — Progress Notes (Signed)
Subjective:    Patient ID: Sydney [name], female    DOB: Aug 06, 1966, 57 y.o.   MRN: [id number]  Patient here for  Chief Complaint  Patient presents with   Annual Exam    HPI Reports she is doing relatively well. Tolerating blood pressure medication.  Blood pressure doing better.  Seeing Dr Katrinka BlazingSmith - left knee pain.  Had MRI - Complex and fairly extensive tear of the lateral meniscus. Intact ligamentous structures and no acute bony findings. Tricompartmental degenerative changes most significant in the lateral compartment. Large joint effusion and moderate to large leaking Baker's cyst. Discussed.  She is interested in seeing a Careers advisersurgeon.  D/w Dr Katrinka BlazingSmith - preference.  Limits activity.  Saw Dr Wyn Quakerew. Planning for lower extremity ultrasound 12/29/22.  Discussed compression hose.  Aggravate knee.  Breathing stable. No increased cough or congestion.  Persistent rash - under left breast - despite nystatin and diflucan.  Vaginal symptoms seems to have improved.     Past Medical History:  Diagnosis Date   Allergy    Anemia    Eczema    GERD (gastroesophageal reflux disease)    Hematuria    Hyperlipidemia    Hypertension    Kidney stone on left side    Lump in female breast    seen by Dr. Birdie SonsByrnette   Metabolic syndrome    Microscopic hematuria    Vitreous detachment of right eye 08/22/2019   Dr. Druscilla BrowniePorfilio,  [facility]   Past Surgical History:  Procedure Laterality Date   oral surgery  Left 10/2020   molar tooth implant    URETHRAL DILATION  Age 18   Family History  Problem Relation Age of Onset   Hypertension Mother    Hyperlipidemia Mother    Dementia Mother    Diabetes Father    CAD Father    Psoriasis Father    Heart disease Father    Cancer Father        Prostate and Multiple Myeloma   Asthma Sister    Thyroid disease Sister    Diabetes Sister    Breast cancer Neg Hx    Social History   Socioeconomic History   Marital status: Single    Spouse name: Not on  file   Number of children: 0   Years of education: Not on file   Highest education level: Bachelor's degree (e.g., BA, AB, BS)  Occupational History    Employer: [employer]    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  Tobacco Use   Smoking status: Never   Smokeless tobacco: Never  Vaping Use   Vaping Use: Never used  Substance and Sexual Activity   Alcohol use: No    Alcohol/week: 0.0 standard drinks of alcohol   Drug use: No   Sexual activity: Not Currently  Other Topics Concern   Not on file  Social History Narrative   Works for [employer], lives alone   She has one cat   Mother is in memory care now    Social Determinants of Health   Financial Resource Strain: Low Risk  (08/28/2021)   Overall Financial Resource Strain (CARDIA)    Difficulty of Paying Living Expenses: Not hard at all  Food Insecurity: No Food Insecurity (08/28/2021)   Hunger Vital Sign    Worried About Running Out of Food in the Last Year: Never true    Ran Out of Food in the Last Year: Never true  Transportation Needs: No Transportation Needs (08/28/2021)  PRAPARE - Administrator, Civil Service (Medical): No    Lack of Transportation (Non-Medical): No  Physical Activity: Inactive (08/28/2021)   Exercise Vital Sign    Days of Exercise per Week: 0 days    Minutes of Exercise per Session: 0 min  Stress: No Stress Concern Present (08/28/2021)   [employer] of Occupational Health - Occupational Stress Questionnaire    Feeling of Stress : Only a little  Social Connections: Moderately Integrated (08/28/2021)   Social Connection and Isolation Panel [NHANES]    Frequency of Communication with Friends and Family: Three times a week    Frequency of Social Gatherings with Friends and Family: Once a week    Attends Religious Services: More than 4 times per year    Active Member of [employer] or Organizations: Yes    Attends Banker Meetings: 1 to 4 times per year    Marital Status: Never  married     Review of Systems  Constitutional:  Negative for appetite change and unexpected weight change.  HENT:  Negative for congestion, sinus pressure and sore throat.   Eyes:  Negative for pain and visual disturbance.  Respiratory:  Negative for cough, chest tightness and shortness of breath.   Cardiovascular:  Negative for chest pain and palpitations.  Gastrointestinal:  Negative for abdominal pain, diarrhea, nausea and vomiting.  Genitourinary:  Negative for difficulty urinating and dysuria.  Musculoskeletal:  Negative for myalgias.       Knee pain as outlined.   Skin:  Positive for rash. Negative for color change.  Neurological:  Negative for dizziness and headaches.  Hematological:  Negative for adenopathy. Does not bruise/bleed easily.  Psychiatric/Behavioral:  Negative for agitation and dysphoric mood.        Objective:     BP 128/80   Pulse 86   Temp 98 F (36.7 C)   Resp 16   Ht 5\' 3"  (1.6 m)   Wt 178 lb 3.2 oz (80.8 kg)   LMP 07/21/2015 (Approximate)   SpO2 99%   BMI 31.57 kg/m  Wt Readings from Last 3 Encounters:  12/10/22 178 lb 3.2 oz (80.8 kg)  11/13/22 177 lb (80.3 kg)  11/12/22 177 lb (80.3 kg)    Physical Exam Vitals reviewed.  Constitutional:      General: She is not in acute distress.    Appearance: Normal appearance. She is well-developed.  HENT:     Head: Normocephalic and atraumatic.     Right Ear: External ear normal.     Left Ear: External ear normal.  Eyes:     General: No scleral icterus.       Right eye: No discharge.        Left eye: No discharge.     Conjunctiva/sclera: Conjunctivae normal.  Neck:     Thyroid: No thyromegaly.  Cardiovascular:     Rate and Rhythm: Normal rate and regular rhythm.  Pulmonary:     Effort: No tachypnea, accessory muscle usage or respiratory distress.     Breath sounds: Normal breath sounds. No decreased breath sounds or wheezing.  Chest:  Breasts:    Right: No inverted nipple, mass, nipple  discharge or tenderness (no axillary adenopathy).     Left: No inverted nipple, mass, nipple discharge or tenderness (no axilarry adenopathy).  Abdominal:     General: Bowel sounds are normal.     Palpations: Abdomen is soft.     Tenderness: There is no abdominal tenderness.  Musculoskeletal:  General: No swelling or tenderness.     Cervical back: Neck supple. No tenderness.  Lymphadenopathy:     Cervical: No cervical adenopathy.  Skin:    Findings: No erythema.     Comments: Erythematous rash - under left breast.  Persistent.   Neurological:     Mental Status: She is alert and oriented to person, place, and time.  Psychiatric:        Mood and Affect: Mood normal.        Behavior: Behavior normal.      Outpatient Encounter Medications as of 12/10/2022  Medication Sig   amLODipine (NORVASC) 5 MG tablet Take 1 tablet (5 mg total) by mouth daily.   Calcium-Phosphorus-Vitamin D (CITRACAL +D3 PO) Take 2 tablets by mouth daily.   Cholecalciferol (D3-1000 PO) Take by mouth daily.   Clindamycin-Benzoyl Per, Refr, gel Apply to affected area 2 (two) times daily.   hydrochlorothiazide (MICROZIDE) 12.5 MG capsule Take 1 capsule (12.5 mg total) by mouth daily.   ibuprofen (ADVIL) 200 MG tablet Take 200 mg by mouth as needed.   Lutein-Zeaxanthin-Bilberry 20-4-40 MG CHEW Chew by mouth daily.   meloxicam (MOBIC) 15 MG tablet Take 1 tablet (15 mg total) by mouth daily.   metoprolol succinate (TOPROL-XL) 25 MG 24 hr tablet Take 0.5 tablets (12.5 mg total) by mouth daily.   Misc Natural Products (AIRBORNE ELDERBERRY) CHEW Chew 2 each by mouth daily. Nature's Bounty Elderberry Gummies   Misc Natural Products (LUTEIN 20 PO) Take 1 each by mouth daily.   Multiple Vitamins-Minerals (CENTRUM ADULTS PO) Take by mouth.   TART CHERRY PO Take by mouth.   Turmeric (QC TUMERIC COMPLEX PO) Take by mouth.   [DISCONTINUED] fluconazole (DIFLUCAN) 100 MG tablet Take 1 tablet (100 mg total) by mouth once a  week x 4 weeks.   [DISCONTINUED] hydrochlorothiazide (MICROZIDE) 12.5 MG capsule TAKE 1 CAPSULE BY MOUTH DAILY.   [DISCONTINUED] metoprolol succinate (TOPROL-XL) 25 MG 24 hr tablet Take 0.5 tablets (12.5 mg total) by mouth daily.   [DISCONTINUED] nystatin cream (MYCOSTATIN) Apply 1 Application topically 2 (two) times daily.   No facility-administered encounter medications on file as of 12/10/2022.     Lab Results  Component Value Date   WBC 4.5 06/24/2022   HGB 12.1 06/24/2022   HCT 36.3 06/24/2022   PLT 204.0 06/24/2022   GLUCOSE 82 06/24/2022   CHOL 207 (H) 06/24/2022   TRIG 93.0 06/24/2022   HDL 51.50 06/24/2022   LDLCALC 137 (H) 06/24/2022   ALT 15 06/24/2022   AST 19 06/24/2022   NA 139 06/24/2022   K 3.8 06/24/2022   CL 102 06/24/2022   CREATININE 0.59 06/24/2022   BUN 21 06/24/2022   CO2 31 06/24/2022   TSH 1.69 06/24/2022   HGBA1C 5.9 06/24/2022    MR Knee Left  Wo Contrast  Result Date: 12/06/2022 CLINICAL DATA:  Left knee pain and swelling.  No specific injury. EXAM: MRI OF THE LEFT KNEE WITHOUT CONTRAST TECHNIQUE: Multiplanar, multisequence MR imaging of the knee was performed. No intravenous contrast was administered. COMPARISON:  None Available. FINDINGS: MENISCI Medial meniscus:  Intact Lateral meniscus: Complex and fairly extensive tear. In the mid body region there is a horizontal cleavage-type tear. This becomes a complex macerated tear involving the anterior horn. LIGAMENTS Cruciates:  Intact Collaterals:  Intact CARTILAGE Patellofemoral: Moderate degenerative chondrosis mainly involving the lateral facet. Medial: Mild degenerative chondrosis with cartilage thinning and early spurring. Lateral: Moderate to advanced degenerative chondrosis with joint  space narrowing and spurring. Joint: Large joint effusion and mild synovitis. Superior and medial patellar plica are noted. Popliteal Fossa:  Moderate to large leaking Baker's cyst. Extensor Mechanism: The patella  retinacular structures are intact and the quadriceps and patellar tendons are intact. Bones: No acute bony findings. Probable small intraosseous ganglion cyst near the ACL attachment on the tibia. Other: Unremarkable knee musculature. IMPRESSION: 1. Complex and fairly extensive tear of the lateral meniscus. 2. Intact ligamentous structures and no acute bony findings. 3. Tricompartmental degenerative changes most significant in the lateral compartment. 4. Large joint effusion and moderate to large leaking Baker's cyst. Electronically Signed   By: Rudie Meyer M.D.   On: 12/06/2022 11:22       Assessment & Plan:  Routine general medical examination at a health care facility  Benign hypertension Assessment & Plan: On hctz, metoprolol and amlodipine.  Desired not to take ace inhibitor or ARB.   Pressure is better.  Continue current medication regimen.  Follow pressures.  Follow metabolic panel.     Hematuria, microscopic Assessment & Plan: Previously saw Dr Apolinar Junes.  She was initially seen last year for a microscopic hematuria workup in 05/2014 which demonstrated an incidental 2 mm left upper pole nonobstructing stone on CT urogram, cystoscopy was negative.    History of iron deficiency anemia Assessment & Plan: Follow cbc.    Hypercholesteremia Assessment & Plan: The 10-year ASCVD risk score (Arnett DK, et al., 2019) is: 3.2%   Values used to calculate the score:     Age: 11 years     Sex: Female     Is Non-Hispanic African American: No     Diabetic: No     Tobacco smoker: No     Systolic Blood Pressure: 128 mmHg     Is BP treated: Yes     HDL Cholesterol: 51.5 mg/dL     Total Cholesterol: 207 mg/dL  Low cholesterol diet and exercise.  Follow lipid panel.    Hyperglycemia Assessment & Plan: Low carb diet and exercise.  Follow met b and A1c.    Left knee pain, unspecified chronicity Assessment & Plan: Seeing Dr Katrinka Blazing - left knee pain.  Had MRI - Complex and fairly extensive  tear of the lateral meniscus. Intact ligamentous structures and no acute bony findings. Tricompartmental degenerative changes most significant in the lateral compartment. Large joint effusion and moderate to large leaking Baker's cyst. Discussed.  She is interested in seeing a Careers adviser. D/w Dr Katrinka Blazing - preferred surgeon.    Rash Assessment & Plan: Persistent rash - beneath left breast despite nystatin and diflucan.  Refer to dermatology for further evaluation.   Orders: -     Ambulatory referral to Dermatology  Estrogen deficiency -     DG Bone Density; Future  Other orders -     hydroCHLOROthiazide; Take 1 capsule (12.5 mg total) by mouth daily.  Dispense: 90 capsule; Refill: 1 -     Metoprolol Succinate ER; Take 0.5 tablets (12.5 mg total) by mouth daily.  Dispense: 45 tablet; Refill: 1     Dale Mapleton, MD

## 2022-12-13 ENCOUNTER — Encounter: Payer: Self-pay | Admitting: Internal Medicine

## 2022-12-13 DIAGNOSIS — M25569 Pain in unspecified knee: Secondary | ICD-10-CM | POA: Insufficient documentation

## 2022-12-13 NOTE — Assessment & Plan Note (Signed)
The 10-year ASCVD risk score (Arnett DK, et al., 2019) is: 3.2%   Values used to calculate the score:     Age: 57 years     Sex: Female     Is Non-Hispanic African American: No     Diabetic: No     Tobacco smoker: No     Systolic Blood Pressure: 128 mmHg     Is BP treated: Yes     HDL Cholesterol: 51.5 mg/dL     Total Cholesterol: 207 mg/dL  Low cholesterol diet and exercise.  Follow lipid panel.

## 2022-12-13 NOTE — Assessment & Plan Note (Signed)
Previously saw Dr Brandon.  She was initially seen last year for a microscopic hematuria workup in 05/2014 which demonstrated an incidental 2 mm left upper pole nonobstructing stone on CT urogram, cystoscopy was negative.  

## 2022-12-13 NOTE — Assessment & Plan Note (Signed)
Seeing Dr Katrinka Blazing - left knee pain.  Had MRI - Complex and fairly extensive tear of the lateral meniscus. Intact ligamentous structures and no acute bony findings. Tricompartmental degenerative changes most significant in the lateral compartment. Large joint effusion and moderate to large leaking Baker's cyst. Discussed.  She is interested in seeing a Careers adviser. D/w Dr Katrinka Blazing - preferred surgeon.

## 2022-12-13 NOTE — Assessment & Plan Note (Signed)
On hctz, metoprolol and amlodipine.  Desired not to take ace inhibitor or ARB.   Pressure is better.  Continue current medication regimen.  Follow pressures.  Follow metabolic panel.   

## 2022-12-13 NOTE — Assessment & Plan Note (Signed)
Follow cbc.  

## 2022-12-13 NOTE — Assessment & Plan Note (Signed)
Persistent rash - beneath left breast despite nystatin and diflucan.  Refer to dermatology for further evaluation.

## 2022-12-13 NOTE — Assessment & Plan Note (Signed)
Low-carb diet and exercise.  Follow met b and A1c. 

## 2022-12-16 ENCOUNTER — Encounter: Payer: Self-pay | Admitting: Family Medicine

## 2022-12-17 ENCOUNTER — Encounter: Payer: Self-pay | Admitting: Internal Medicine

## 2022-12-17 ENCOUNTER — Other Ambulatory Visit: Payer: Self-pay

## 2022-12-17 DIAGNOSIS — M222X2 Patellofemoral disorders, left knee: Secondary | ICD-10-CM

## 2022-12-23 DIAGNOSIS — M25562 Pain in left knee: Secondary | ICD-10-CM | POA: Diagnosis not present

## 2022-12-29 ENCOUNTER — Other Ambulatory Visit: Payer: Self-pay

## 2022-12-29 ENCOUNTER — Ambulatory Visit (INDEPENDENT_AMBULATORY_CARE_PROVIDER_SITE_OTHER): Payer: Commercial Managed Care - PPO | Admitting: Vascular Surgery

## 2022-12-29 ENCOUNTER — Ambulatory Visit (INDEPENDENT_AMBULATORY_CARE_PROVIDER_SITE_OTHER): Payer: Commercial Managed Care - PPO

## 2022-12-29 VITALS — BP 129/73 | HR 87 | Resp 16 | Ht 63.75 in | Wt 178.8 lb

## 2022-12-29 DIAGNOSIS — M7989 Other specified soft tissue disorders: Secondary | ICD-10-CM | POA: Diagnosis not present

## 2022-12-29 DIAGNOSIS — I83813 Varicose veins of bilateral lower extremities with pain: Secondary | ICD-10-CM | POA: Diagnosis not present

## 2022-12-29 DIAGNOSIS — I10 Essential (primary) hypertension: Secondary | ICD-10-CM

## 2022-12-29 NOTE — Progress Notes (Signed)
MRN : [id number]  Sydney [name] is a 57 y.o. (Jan 02, 1966) female who presents with chief complaint of  Chief Complaint  Patient presents with   Follow-up  .  History of Present Illness: Patient returns today in follow up of her leg pain and swelling. She has a meniscus tear in the left leg.  This is bothering her a fair bit.  She is having some swelling in both legs.  She has not been able to wear her compression socks due to the pain in her left knee and the compression socks essentially pressing on that and being very painful.  She is taking anti-inflammatories and elevating her legs.  Venous reflux study performed today demonstrates significant venous reflux in both great saphenous veins actually worse on the right than the left.  No DVT or superficial thrombophlebitis was seen.  Current Outpatient Medications  Medication Sig Dispense Refill   amLODipine (NORVASC) 5 MG tablet Take 1 tablet (5 mg total) by mouth daily. 30 tablet 3   Calcium-Phosphorus-Vitamin D (CITRACAL +D3 PO) Take 2 tablets by mouth daily.     Cholecalciferol (D3-1000 PO) Take by mouth daily.     Clindamycin-Benzoyl Per, Refr, gel Apply to affected area 2 (two) times daily. 45 g 2   hydrochlorothiazide (MICROZIDE) 12.5 MG capsule Take 1 capsule (12.5 mg total) by mouth daily. 90 capsule 1   ibuprofen (ADVIL) 200 MG tablet Take 200 mg by mouth as needed.     Lutein-Zeaxanthin-Bilberry 20-4-40 MG CHEW Chew by mouth daily.     meloxicam (MOBIC) 15 MG tablet Take 1 tablet (15 mg total) by mouth daily. 30 tablet 0   metoprolol succinate (TOPROL-XL) 25 MG 24 hr tablet Take 0.5 tablets (12.5 mg total) by mouth daily. 45 tablet 1   Misc Natural Products (AIRBORNE ELDERBERRY) CHEW Chew 2 each by mouth daily. Nature's Bounty Elderberry Gummies     Misc Natural Products (LUTEIN 20 PO) Take 1 each by mouth daily.     Multiple Vitamins-Minerals (CENTRUM ADULTS PO) Take by mouth.     TART CHERRY PO Take by mouth.      Turmeric (QC TUMERIC COMPLEX PO) Take by mouth.     No current facility-administered medications for this visit.    Past Medical History:  Diagnosis Date   Allergy    Anemia    Eczema    GERD (gastroesophageal reflux disease)    Hematuria    Hyperlipidemia    Hypertension    Kidney stone on left side    Lump in female breast    seen by Dr. Birdie Sons   Metabolic syndrome    Microscopic hematuria    Vitreous detachment of right eye 08/22/2019   Dr. Druscilla Brownie,  [facility]    Past Surgical History:  Procedure Laterality Date   oral surgery  Left 10/2020   molar tooth implant    URETHRAL DILATION  Age 21     Social History   Tobacco Use   Smoking status: Never   Smokeless tobacco: Never  Vaping Use   Vaping Use: Never used  Substance Use Topics   Alcohol use: No    Alcohol/week: 0.0 standard drinks of alcohol   Drug use: No      Family History  Problem Relation Age of Onset   Hypertension Mother    Hyperlipidemia Mother    Dementia Mother    Diabetes Father    CAD Father    Psoriasis Father  Heart disease Father    Cancer Father        Prostate and Multiple Myeloma   Asthma Sister    Thyroid disease Sister    Diabetes Sister    Breast cancer Neg Hx      Allergies  Allergen Reactions   Amoxicillin    Sulfa Antibiotics     REVIEW OF SYSTEMS (Negative unless checked)   Constitutional: [] Weight loss  [] Fever  [] Chills Cardiac: [] Chest pain   [] Chest pressure   [] Palpitations   [] Shortness of breath when laying flat   [] Shortness of breath at rest   [] Shortness of breath with exertion. Vascular:  [] Pain in legs with walking   [] Pain in legs at rest   [] Pain in legs when laying flat   [] Claudication   [] Pain in feet when walking  [] Pain in feet at rest  [] Pain in feet when laying flat   [] History of DVT   [] Phlebitis   [x] Swelling in legs   [x] Varicose veins   [] Non-healing ulcers Pulmonary:   [] Uses home oxygen   [] Productive cough    [] Hemoptysis   [] Wheeze  [] COPD   [] Asthma Neurologic:  [] Dizziness  [] Blackouts   [] Seizures   [] History of stroke   [] History of TIA  [] Aphasia   [] Temporary blindness   [] Dysphagia   [] Weakness or numbness in arms   [] Weakness or numbness in legs Musculoskeletal:  [] Arthritis   [] Joint swelling   [x] Joint pain   [] Low back pain Hematologic:  [] Easy bruising  [] Easy bleeding   [] Hypercoagulable state   [x] Anemic  [] Hepatitis Gastrointestinal:  [] Blood in stool   [] Vomiting blood  [x] Gastroesophageal reflux/heartburn   [] Abdominal pain Genitourinary:  [] Chronic kidney disease   [] Difficult urination  [] Frequent urination  [] Burning with urination   [x] Hematuria Skin:  [] Rashes   [] Ulcers   [] Wounds Psychological:  [] History of anxiety   []  History of major depression.  Physical Examination  BP 129/73 (BP Location: Right Arm)   Pulse 87   Resp 16   Ht 5' 3.75" (1.619 m)   Wt 178 lb 12.8 oz (81.1 kg)   LMP 07/21/2015 (Approximate)   BMI 30.93 kg/m  Gen:  WD/WN, NAD Head: [place]/AT, No temporalis wasting. Ear/Nose/Throat: Hearing grossly intact, nares w/o erythema or drainage Eyes: Conjunctiva clear. Sclera non-icteric Neck: Supple.  Trachea midline Pulmonary:  Good air movement, no use of accessory muscles.  Cardiac: RRR, no JVD Vascular:  Vessel Right Left  Radial Palpable Palpable                          PT Palpable Palpable  DP Palpable Palpable   Gastrointestinal: soft, non-tender/non-distended. No guarding/reflex.  Musculoskeletal: M/S 5/5 throughout.  No deformity or atrophy. Mild BLE edema. Neurologic: Sensation grossly intact in extremities.  Symmetrical.  Speech is fluent.  Psychiatric: Judgment intact, Mood & affect appropriate for pt's clinical situation. Dermatologic: No rashes or ulcers noted.  No cellulitis or open wounds.      Labs No results found for this or any previous visit (from the past 2160 hour(s)).  Radiology MR Knee Left  Wo Contrast  Result  Date: 12/06/2022 CLINICAL DATA:  Left knee pain and swelling.  No specific injury. EXAM: MRI OF THE LEFT KNEE WITHOUT CONTRAST TECHNIQUE: Multiplanar, multisequence MR imaging of the knee was performed. No intravenous contrast was administered. COMPARISON:  None Available. FINDINGS: MENISCI Medial meniscus:  Intact Lateral meniscus: Complex and fairly extensive tear. In the mid body  region there is a horizontal cleavage-type tear. This becomes a complex macerated tear involving the anterior horn. LIGAMENTS Cruciates:  Intact Collaterals:  Intact CARTILAGE Patellofemoral: Moderate degenerative chondrosis mainly involving the lateral facet. Medial: Mild degenerative chondrosis with cartilage thinning and early spurring. Lateral: Moderate to advanced degenerative chondrosis with joint space narrowing and spurring. Joint: Large joint effusion and mild synovitis. Superior and medial patellar plica are noted. Popliteal Fossa:  Moderate to large leaking Baker's cyst. Extensor Mechanism: The patella retinacular structures are intact and the quadriceps and patellar tendons are intact. Bones: No acute bony findings. Probable small intraosseous ganglion cyst near the ACL attachment on the tibia. Other: Unremarkable knee musculature. IMPRESSION: 1. Complex and fairly extensive tear of the lateral meniscus. 2. Intact ligamentous structures and no acute bony findings. 3. Tricompartmental degenerative changes most significant in the lateral compartment. 4. Large joint effusion and moderate to large leaking Baker's cyst. Electronically Signed   By: Rudie Meyer M.D.   On: 12/06/2022 11:22    Assessment/Plan  Benign hypertension blood pressure control important in reducing the progression of atherosclerotic disease. On appropriate oral medications.   Varicose veins of both lower extremities with pain Venous reflux study performed today demonstrates significant venous reflux in both great saphenous veins actually worse on  the right than the left.  No DVT or superficial thrombophlebitis was seen.  We had a long discussion today.  I think given her knee injury and likely upcoming surgery for her meniscus, we should put her venous treatments on hold for now.  Her venous reflux is actually worse on the right than the left.  It is little bit difficult to discern what level of symptoms are from her knee and water from her venous issues.  Also, when she has knee surgery she can wear compression socks to get her response to conservative therapy.  I will plan to see her back in 3 to 4 months.    Festus Barren, MD  12/29/2022 5:16 PM    This note was created with Dragon medical transcription system.  Any errors from dictation are purely unintentional

## 2022-12-29 NOTE — Assessment & Plan Note (Signed)
blood pressure control important in reducing the progression of atherosclerotic disease. On appropriate oral medications.  

## 2022-12-29 NOTE — Assessment & Plan Note (Signed)
Venous reflux study performed today demonstrates significant venous reflux in both great saphenous veins actually worse on the right than the left.  No DVT or superficial thrombophlebitis was seen.  We had a long discussion today.  I think given her knee injury and likely upcoming surgery for her meniscus, we should put her venous treatments on hold for now.  Her venous reflux is actually worse on the right than the left.  It is little bit difficult to discern what level of symptoms are from her knee and water from her venous issues.  Also, when she has knee surgery she can wear compression socks to get her response to conservative therapy.  I will plan to see her back in 3 to 4 months.

## 2022-12-30 NOTE — Progress Notes (Unsigned)
[name] [address] [place] [address] Phone: [phone] Subjective:   [name], am serving as a scribe for Dr. Antoine Primas.  I'm seeing this patient by the request  of:  Sydney Brady, MD  CC: Ankle pain, knee pain  [id number]  11/12/2022 Patient is showing signs of left-sided knee instability.  Worsening effusion also noted.  Because of the x-ray showing that there has been some mild arthritic changes. At this point continue decreasing the severity of symptoms oriented.  Patient has failed physical therapy, bracing, home exercises.  Will get advanced imaging and see if any surgical intervention may be necessary.  Update 12/31/2022 Sydney [name] is a 57 y.o. female coming in with complaint of L wrist and L knee pain. Referred to Dr. Jerl [name] for meniscal tear. Patient states wants to talk about visits with ortho and vascular. Right foot is swelling again.  MRI L knee 12/03/2022  IMPRESSION: 1. Complex and fairly extensive tear of the lateral meniscus. 2. Intact ligamentous structures and no acute bony findings. 3. Tricompartmental degenerative changes most significant in the lateral compartment. 4. Large joint effusion and moderate to large leaking Baker's cyst.     Past Medical History:  Diagnosis Date   Allergy    Anemia    Eczema    GERD (gastroesophageal reflux disease)    Hematuria    Hyperlipidemia    Hypertension    Kidney stone on left side    Lump in female breast    seen by Dr. Birdie [name]   Metabolic syndrome    Microscopic hematuria    Vitreous detachment of right eye 08/22/2019   Dr[facility] 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  Tobacco Use   Smoking status: Never   Smokeless tobacco: Never  Vaping Use   Vaping Use: Never used  Substance and Sexual Activity   Alcohol use: No    Alcohol/week: 0.0 standard drinks of alcohol   Drug use: No   Sexual activity: Not Currently  Other Topics Concern   Not on file  Social History Narrative   Works for [employer], lives alone   She has one cat   Mother is in memory care now    Social Determinants of Health   Financial Resource Strain: Low Risk  (08/28/2021)   Overall Financial Resource Strain (CARDIA)    Difficulty of Paying Living Expenses: Not hard at all  Food Insecurity: No Food Insecurity (08/28/2021)   Hunger Vital Sign    Worried About Running Out of Food in the Last Year: Never true    Ran Out of Food in the Last Year: Never true  Transportation Needs: No Transportation Needs (08/28/2021)   PRAPARE - Administrator, Civil Service (Medical): No    Lack of Transportation (Non-Medical): No  Physical Activity: Inactive (08/28/2021)   Exercise Vital Sign    Days of Exercise per Week: 0 days    Minutes of  Exercise per Session: 0 min  Stress: No Stress Concern Present (08/28/2021)   [employer] of Occupational Health - Occupational Stress Questionnaire    Feeling of Stress : Only a little  Social Connections: Moderately Integrated (08/28/2021)   Social Connection and Isolation Panel [NHANES]    Frequency of Communication with Friends and Family: Three times a week    Frequency of Social Gatherings with Friends and Family: Once a week    Attends Religious Services: More than 4 times per year    Active Member of [employer] or Organizations: Yes    Attends Engineer, structural: 1 to 4 times per year    Marital Status: Never married   Allergies  Allergen Reactions   Amoxicillin     Sulfa Antibiotics    Family History  Problem Relation Age of Onset   Hypertension Mother    Hyperlipidemia Mother    Dementia Mother    Diabetes Father    CAD Father    Psoriasis Father    Heart disease Father    Cancer Father        Prostate and Multiple Myeloma   Asthma Sister    Thyroid disease Sister    Diabetes Sister    Breast cancer Neg Hx      Current Outpatient Medications (Cardiovascular):    amLODipine (NORVASC) 5 MG tablet, Take 1 tablet (5 mg total) by mouth daily.   hydrochlorothiazide (MICROZIDE) 12.5 MG capsule, Take 1 capsule (12.5 mg total) by mouth daily.   metoprolol succinate (TOPROL-XL) 25 MG 24 hr tablet, Take 0.5 tablets (12.5 mg total) by mouth daily.   Current Outpatient Medications (Analgesics):    ibuprofen (ADVIL) 200 MG tablet, Take 200 mg by mouth as needed.   meloxicam (MOBIC) 15 MG tablet, Take 1 tablet (15 mg total) by mouth daily.   Current Outpatient Medications (Other):    Calcium-Phosphorus-Vitamin D (CITRACAL +D3 PO), Take 2 tablets by mouth daily.   Cholecalciferol (D3-1000 PO), Take by mouth daily.   Clindamycin-Benzoyl Per, Refr, gel, Apply to affected area 2 (two) times daily.   Lutein-Zeaxanthin-Bilberry 20-4-40 MG CHEW, Chew by mouth daily.   Misc Natural Products (AIRBORNE ELDERBERRY) CHEW, Chew 2 each by mouth daily. Nature's Bounty Elderberry Gummies   Misc Natural Products (LUTEIN 20 PO), Take 1 each by mouth daily.   Multiple Vitamins-Minerals (CENTRUM ADULTS PO), Take by mouth.   TART CHERRY PO, Take by mouth.   Turmeric (QC TUMERIC COMPLEX PO), Take by mouth.   Reviewed prior external information including notes and imaging from  primary care provider As well as notes that were available from care everywhere and other healthcare systems.  Past medical history, social, surgical and family history all reviewed in electronic medical record.  No pertanent information unless stated regarding to the chief complaint.    Review of Systems:  No headache, visual changes, nausea, vomiting, diarrhea, constipation, dizziness, abdominal pain, skin rash, fevers, chills, night sweats, weight loss, swollen lymph nodes, body aches, chest pain, shortness of breath, mood changes. POSITIVE muscle aches, joint swelling  Objective  Blood pressure 138/82, pulse 85, height 5\' 3"  (1.6 m), weight 178 lb (80.7 kg), last menstrual period 07/21/2015, SpO2 98 %.   General: No apparent distress alert and oriented x3 mood and affect normal, dressed appropriately.  HEENT: Pupils equal, extraocular movements intact  Respiratory: Patient's speak in full sentences and does not appear short of breath  Cardiovascular: No lower extremity edema, non tender, no erythema  Left  knee does have effusion noted.  Lacks last 2 degrees of extension as well as the last 20 degrees of flexion.  Baker's cyst palpated in the posterior popliteal area as well. Right ankle very mild tenderness over the Achilles tendon but appears to be no significant swelling.  Procedure: Real-time Ultrasound Guided Injection of left knee Device: GE Logiq Q7 Ultrasound guided injection is preferred based studies that show increased duration, increased effect, greater accuracy, decreased procedural pain, increased response rate, and decreased cost with ultrasound guided versus blind injection.  Verbal informed consent obtained.  Time-out conducted.  Noted no overlying erythema, induration, or other signs of local infection.  Skin prepped in a sterile fashion.  Local anesthesia: Topical Ethyl chloride.  With sterile technique and under real time ultrasound guidance: With a 22-gauge 2 inch needle patient was injected with 4 cc of 0.5% Marcaine and aspirated 35 cc of straw light-colored fluid then injected with 1 cc of Kenalog 40 mg/dL. This was from a superior lateral approach.  Completed without difficulty  Pain immediately resolved suggesting accurate placement of the  medication.  Advised to call if fevers/chills, erythema, induration, drainage, or persistent bleeding.  Impression: Technically successful ultrasound guided injection.    Impression and Recommendations:    The above documentation has been reviewed and is accurate and complete Judi Saa, DO

## 2022-12-31 ENCOUNTER — Ambulatory Visit: Payer: Commercial Managed Care - PPO | Admitting: Family Medicine

## 2022-12-31 ENCOUNTER — Other Ambulatory Visit: Payer: Self-pay

## 2022-12-31 ENCOUNTER — Encounter: Payer: Self-pay | Admitting: Family Medicine

## 2022-12-31 VITALS — BP 138/82 | HR 85 | Ht 63.0 in | Wt 178.0 lb

## 2022-12-31 DIAGNOSIS — M222X2 Patellofemoral disorders, left knee: Secondary | ICD-10-CM

## 2022-12-31 DIAGNOSIS — M2550 Pain in unspecified joint: Secondary | ICD-10-CM | POA: Diagnosis not present

## 2022-12-31 NOTE — Assessment & Plan Note (Signed)
Patient continues to try to be active.  Wants to hold on any type of surgical intervention secondary to the meniscal injury as well as the patellofemoral arthritis.  Patient did have good aspiration and will send the laboratory show there is no crystal formation.  Discussed with patient about bracing, home exercises as well as the possibility of viscosupplementation.  Follow-up again in 6 to 8 weeks

## 2022-12-31 NOTE — Patient Instructions (Addendum)
Drained L knee today See you again in 6-8 weeks

## 2023-01-01 LAB — SYNOVIAL FLUID ANALYSIS, COMPLETE
Basophils, %: 0 %
Eosinophils-Synovial: 0 % (ref 0–2)
Lymphocytes-Synovial Fld: 30 % (ref 0–74)
Monocyte/Macrophage: 70 % — ABNORMAL HIGH (ref 0–69)
Neutrophil, Synovial: 0 % (ref 0–24)
Synoviocytes, %: 0 % (ref 0–15)
WBC, Synovial: 463 cells/uL — ABNORMAL HIGH (ref <150)

## 2023-01-06 MED FILL — Amlodipine Besylate Tab 5 MG (Base Equivalent): ORAL | 30 days supply | Qty: 30 | Fill #1 | Status: AC

## 2023-01-14 ENCOUNTER — Ambulatory Visit
Admission: RE | Admit: 2023-01-14 | Discharge: 2023-01-14 | Disposition: A | Discharge status: Home / Self Care | Payer: Commercial Managed Care - PPO | Source: Ambulatory Visit | Attending: Internal Medicine | Admitting: Internal Medicine

## 2023-01-14 DIAGNOSIS — Z780 Asymptomatic menopausal state: Secondary | ICD-10-CM | POA: Diagnosis not present

## 2023-01-14 DIAGNOSIS — E2839 Other primary ovarian failure: Secondary | ICD-10-CM | POA: Insufficient documentation

## 2023-02-05 DIAGNOSIS — D2272 Melanocytic nevi of left lower limb, including hip: Secondary | ICD-10-CM | POA: Diagnosis not present

## 2023-02-05 DIAGNOSIS — L2089 Other atopic dermatitis: Secondary | ICD-10-CM | POA: Diagnosis not present

## 2023-02-05 DIAGNOSIS — D225 Melanocytic nevi of trunk: Secondary | ICD-10-CM | POA: Diagnosis not present

## 2023-02-05 DIAGNOSIS — L728 Other follicular cysts of the skin and subcutaneous tissue: Secondary | ICD-10-CM | POA: Diagnosis not present

## 2023-02-05 DIAGNOSIS — D2271 Melanocytic nevi of right lower limb, including hip: Secondary | ICD-10-CM | POA: Diagnosis not present

## 2023-02-05 DIAGNOSIS — L304 Erythema intertrigo: Secondary | ICD-10-CM | POA: Diagnosis not present

## 2023-02-05 DIAGNOSIS — D2262 Melanocytic nevi of left upper limb, including shoulder: Secondary | ICD-10-CM | POA: Diagnosis not present

## 2023-02-05 DIAGNOSIS — D2261 Melanocytic nevi of right upper limb, including shoulder: Secondary | ICD-10-CM | POA: Diagnosis not present

## 2023-02-06 ENCOUNTER — Other Ambulatory Visit: Payer: Self-pay

## 2023-02-06 MED ORDER — TRIAMCINOLONE ACETONIDE 0.1 % EX CREA
1.0000 | TOPICAL_CREAM | Freq: Two times a day (BID) | CUTANEOUS | 1 refills | Status: DC
  Filled 2023-02-06: qty 80, 40d supply, fill #0

## 2023-02-06 MED ORDER — HYDROCORTISONE 2.5 % EX CREA
1.0000 | TOPICAL_CREAM | Freq: Two times a day (BID) | CUTANEOUS | 2 refills | Status: AC
  Filled 2023-02-06: qty 454, 90d supply, fill #0

## 2023-02-07 ENCOUNTER — Other Ambulatory Visit: Payer: Self-pay

## 2023-02-08 ENCOUNTER — Other Ambulatory Visit: Payer: Self-pay

## 2023-02-09 ENCOUNTER — Other Ambulatory Visit: Payer: Self-pay

## 2023-02-10 MED FILL — Amlodipine Besylate Tab 5 MG (Base Equivalent): ORAL | 30 days supply | Qty: 30 | Fill #2 | Status: AC

## 2023-02-10 NOTE — Progress Notes (Signed)
[name] [address] [place] [address] Phone: [phone] Subjective:   Sydney [name], am serving as a scribe for Dr. Antoine Primas.  I'm seeing this patient by the request  of:  Sydney Lorenz Park, MD  CC: Left knee pain follow-up  [id number]  12/31/2022 Patient continues to try to be active.  Wants to hold on any type of surgical intervention secondary to the meniscal injury as well as the patellofemoral arthritis.  Patient did have good aspiration and will send the laboratory show there is no crystal formation.  Discussed with patient about bracing, home exercises as well as the possibility of viscosupplementation.  Follow-up again in 6 to 8 weeks      Update 02/11/2023 Sydney [name] is a 57 y.o. female coming in with complaint of L knee pain. Patient states that she was feeling good. Was very active on Saturday and her knee pain increased. Knee is irritated over lateral aspect. Wants to know if she is able to do water aerobics today.        Past Medical History:  Diagnosis Date   Allergy    Anemia    Eczema    GERD (gastroesophageal reflux disease)    Hematuria    Hyperlipidemia    Hypertension    Kidney stone on left side    Lump in female breast    seen by Dr. Birdie [name]   Metabolic syndrome    Microscopic hematuria    Vitreous detachment of right eye 08/22/2019   Dr. 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  Tobacco Use   Smoking status: Never   Smokeless tobacco: Never  Vaping Use    Vaping Use: Never used  Substance and Sexual Activity   Alcohol use: No    Alcohol/week: 0.0 standard drinks of alcohol   Drug use: No   Sexual activity: Not Currently  Other Topics Concern   Not on file  Social History Narrative   Works for [employer], lives alone   She has one cat   Mother is in memory care now    Social Determinants of Health   Financial Resource Strain: Low Risk  (08/28/2021)   Overall Financial Resource Strain (CARDIA)    Difficulty of Paying Living Expenses: Not hard at all  Food Insecurity: No Food Insecurity (08/28/2021)   Hunger Vital Sign    Worried About Running Out of Food in the Last Year: Never true    Ran Out of Food in the Last Year: Never true  Transportation Needs: No Transportation Needs (08/28/2021)   PRAPARE - Administrator, Civil Service (Medical): No    Lack of Transportation (Non-Medical): No  Physical Activity: Inactive (08/28/2021)   Exercise Vital Sign    Days of Exercise per Week: 0 days    Minutes of Exercise per Session: 0 min  Stress: No Stress Concern Present (08/28/2021)   [employer] of Occupational Health - Occupational Stress Questionnaire  Feeling of Stress : Only a little  Social Connections: Moderately Integrated (08/28/2021)   Social Connection and Isolation Panel [NHANES]    Frequency of Communication with Friends and Family: Three times a week    Frequency of Social Gatherings with Friends and Family: Once a week    Attends Religious Services: More than 4 times per year    Active Member of [employer] or Organizations: Yes    Attends Engineer, structural: 1 to 4 times per year    Marital Status: Never married   Allergies  Allergen Reactions   Amoxicillin    Sulfa Antibiotics    Family History  Problem Relation Age of Onset   Hypertension Mother    Hyperlipidemia Mother    Dementia Mother    Diabetes Father    CAD Father    Psoriasis Father    Heart disease Father    Cancer Father         Prostate and Multiple Myeloma   Asthma Sister    Thyroid disease Sister    Diabetes Sister    Breast cancer Neg Hx      Current Outpatient Medications (Cardiovascular):    amLODipine (NORVASC) 5 MG tablet, Take 1 tablet (5 mg total) by mouth daily.   hydrochlorothiazide (MICROZIDE) 12.5 MG capsule, Take 1 capsule (12.5 mg total) by mouth daily.   metoprolol succinate (TOPROL-XL) 25 MG 24 hr tablet, Take 0.5 tablets (12.5 mg total) by mouth daily.   Current Outpatient Medications (Analgesics):    ibuprofen (ADVIL) 200 MG tablet, Take 200 mg by mouth as needed.   meloxicam (MOBIC) 15 MG tablet, Take 1 tablet (15 mg total) by mouth daily.   Current Outpatient Medications (Other):    Calcium-Phosphorus-Vitamin D (CITRACAL +D3 PO), Take 2 tablets by mouth daily.   Cholecalciferol (D3-1000 PO), Take by mouth daily.   Clindamycin-Benzoyl Per, Refr, gel, Apply to affected area 2 (two) times daily.   hydrocortisone 2.5 % cream, Apply 1 Application to afffectead area under breast topically 2 (two) times daily until clear.   Lutein-Zeaxanthin-Bilberry 20-4-40 MG CHEW, Chew by mouth daily.   Misc Natural Products (AIRBORNE ELDERBERRY) CHEW, Chew 2 each by mouth daily. Nature's Bounty Elderberry Gummies   Misc Natural Products (LUTEIN 20 PO), Take 1 each by mouth daily.   Multiple Vitamins-Minerals (CENTRUM ADULTS PO), Take by mouth.   TART CHERRY PO, Take by mouth.   triamcinolone cream (KENALOG) 0.1 %, Apply 1 Application topically 2 (two) times daily until clear, then stop.   Turmeric (QC TUMERIC COMPLEX PO), Take by mouth.    Objective  Blood pressure 130/82, pulse 74, height 5\' 3"  (1.6 m), weight 180 lb (81.6 kg), last menstrual period 07/21/2015, SpO2 98 %.   General: No apparent distress alert and oriented x3 mood and affect normal, dressed appropriately.  HEENT: Pupils equal, extraocular movements intact  Respiratory: Patient's speak in full sentences and does not appear short of  breath  Cardiovascular: No lower extremity edema, non tender, no erythema  Left knee exam shows the patient does have some crepitus noted.  Patient does have some fullness noted in the popliteal area.  No significant instability but still has lateral tracking of the patella noted.    Impression and Recommendations:     The above documentation has been reviewed and is accurate and complete Judi Saa, DO

## 2023-02-11 ENCOUNTER — Ambulatory Visit: Payer: Commercial Managed Care - PPO | Admitting: Family Medicine

## 2023-02-11 VITALS — BP 130/82 | HR 74 | Ht 63.0 in | Wt 180.0 lb

## 2023-02-11 DIAGNOSIS — M222X2 Patellofemoral disorders, left knee: Secondary | ICD-10-CM | POA: Diagnosis not present

## 2023-02-11 NOTE — Assessment & Plan Note (Signed)
Patient is doing significantly better.  Does feel like she has a Baker's cyst in the back of her knee.  Discussed the potential for aspiration but because she is feeling so good she wants to continue to monitor.  Discussed icing regimen and home exercises, discussed which activities to do and which ones to avoid.  Follow-up again 2 to 3 months

## 2023-02-11 NOTE — Patient Instructions (Signed)
Deep water running Ice at end of activity Avoid twisting motions Asking exercises See me in 2-3 months

## 2023-03-12 ENCOUNTER — Ambulatory Visit: Payer: Commercial Managed Care - PPO | Admitting: Internal Medicine

## 2023-03-12 ENCOUNTER — Other Ambulatory Visit: Payer: Self-pay

## 2023-03-12 VITALS — BP 124/78 | HR 93 | Temp 97.9°F | Resp 16 | Ht 63.0 in | Wt 178.4 lb

## 2023-03-12 DIAGNOSIS — R739 Hyperglycemia, unspecified: Secondary | ICD-10-CM

## 2023-03-12 DIAGNOSIS — R252 Cramp and spasm: Secondary | ICD-10-CM | POA: Diagnosis not present

## 2023-03-12 DIAGNOSIS — R3129 Other microscopic hematuria: Secondary | ICD-10-CM

## 2023-03-12 DIAGNOSIS — I83813 Varicose veins of bilateral lower extremities with pain: Secondary | ICD-10-CM

## 2023-03-12 DIAGNOSIS — M222X2 Patellofemoral disorders, left knee: Secondary | ICD-10-CM | POA: Diagnosis not present

## 2023-03-12 DIAGNOSIS — Z862 Personal history of diseases of the blood and blood-forming organs and certain disorders involving the immune mechanism: Secondary | ICD-10-CM

## 2023-03-12 DIAGNOSIS — I10 Essential (primary) hypertension: Secondary | ICD-10-CM

## 2023-03-12 DIAGNOSIS — E7800 Pure hypercholesterolemia, unspecified: Secondary | ICD-10-CM | POA: Diagnosis not present

## 2023-03-12 MED ORDER — MUPIROCIN 2 % EX OINT
1.0000 | TOPICAL_OINTMENT | Freq: Two times a day (BID) | CUTANEOUS | 0 refills | Status: DC
  Filled 2023-03-12: qty 22, 30d supply, fill #0

## 2023-03-12 NOTE — Progress Notes (Signed)
Subjective:    Patient ID: Sydney [name], female    DOB: 1966-01-20, 57 y.o.   MRN: [id number]  Patient here for  Chief Complaint  Patient presents with   Medical Management of Chronic Issues    HPI Here to follow up regarding hypertension. Blood pressure doing better. Saw Dr Wyn Quaker - 12/29/22 - Venous reflux study performed today demonstrates significant venous reflux in both great saphenous veins actually worse on the right than the left. No DVT or superficial thrombophlebitis was seen. Discussed today. Discussed holding on venous treatments for now.  Deal with her knee issues first.  Discussed compression hose. Reports leg cramps.  Discussed staying hydrated.  Discussed stretches.  Discussed checking labs - electrolytes, etc. Great toe lesion - discussed bactroban.     Past Medical History:  Diagnosis Date   Allergy    Anemia    Eczema    GERD (gastroesophageal reflux disease)    Hematuria    Hyperlipidemia    Hypertension    Kidney stone on left side    Lump in female breast    seen by Dr. Birdie Sons   Metabolic syndrome    Microscopic hematuria    Vitreous detachment of right eye 08/22/2019   Dr. Druscilla Brownie,  [facility]   Past Surgical History:  Procedure Laterality Date   oral surgery  Left 10/2020   molar tooth implant    URETHRAL DILATION  Age 88   Family History  Problem Relation Age of Onset   Hypertension Mother    Hyperlipidemia Mother    Dementia Mother    Diabetes Father    CAD Father    Psoriasis Father    Heart disease Father    Cancer Father        Prostate and Multiple Myeloma   Asthma Sister    Thyroid disease Sister    Diabetes Sister    Breast cancer Neg Hx    Social History   Socioeconomic History   Marital status: Single    Spouse name: Not on file   Number of children: 0   Years of education: Not on file   Highest education level: Bachelor's degree (e.g., BA, AB, BS)  Occupational History    Employer: [employer]     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  Tobacco Use   Smoking status: Never   Smokeless tobacco: Never  Vaping Use   Vaping status: Never Used  Substance and Sexual Activity   Alcohol use: No    Alcohol/week: 0.0 standard drinks of alcohol   Drug use: No   Sexual activity: Not Currently  Other Topics Concern   Not on file  Social History Narrative   Works for [employer], lives alone   She has one cat   Mother is in memory care now    Social Determinants of Health   Financial Resource Strain: Low Risk  (08/28/2021)   Overall Financial Resource Strain (CARDIA)    Difficulty of Paying Living Expenses: Not hard at all  Food Insecurity: No Food Insecurity (08/28/2021)   Hunger Vital Sign    Worried About Running Out of Food in the Last Year: Never true    Ran Out of Food in the Last Year: Never true  Transportation Needs: No Transportation Needs (08/28/2021)   PRAPARE - Administrator, Civil Service (Medical): No    Lack of Transportation (Non-Medical): No  Physical Activity: Inactive (08/28/2021)   Exercise Vital Sign    Days of  Exercise per Week: 0 days    Minutes of Exercise per Session: 0 min  Stress: No Stress Concern Present (08/28/2021)   [employer] of Occupational Health - Occupational Stress Questionnaire    Feeling of Stress : Only a little  Social Connections: Moderately Integrated (08/28/2021)   Social Connection and Isolation Panel [NHANES]    Frequency of Communication with Friends and Family: Three times a week    Frequency of Social Gatherings with Friends and Family: Once a week    Attends Religious Services: More than 4 times per year    Active Member of [employer] or Organizations: Yes    Attends Banker Meetings: 1 to 4 times per year    Marital Status: Never married     Review of Systems  Constitutional:  Negative for appetite change and unexpected weight change.  HENT:  Negative for congestion and sinus pressure.   Respiratory:   Negative for cough, chest tightness and shortness of breath.   Cardiovascular:  Negative for chest pain and palpitations.  Gastrointestinal:  Negative for abdominal pain, diarrhea, nausea and vomiting.  Genitourinary:  Negative for difficulty urinating and dysuria.  Musculoskeletal:  Negative for myalgias.       Leg cramps as outlined.   Skin:  Negative for color change and rash.  Neurological:  Negative for dizziness and headaches.  Psychiatric/Behavioral:  Negative for agitation and dysphoric mood.        Objective:     BP 124/78   Pulse 93   Temp 97.9 F (36.6 C)   Resp 16   Ht 5\' 3"  (1.6 m)   Wt 178 lb 6.4 oz (80.9 kg)   LMP 07/21/2015 (Approximate)   SpO2 98%   BMI 31.60 kg/m  Wt Readings from Last 3 Encounters:  03/12/23 178 lb 6.4 oz (80.9 kg)  02/11/23 180 lb (81.6 kg)  12/31/22 178 lb (80.7 kg)    Physical Exam Vitals reviewed.  Constitutional:      General: She is not in acute distress.    Appearance: Normal appearance.  HENT:     Head: Normocephalic and atraumatic.     Right Ear: External ear normal.     Left Ear: External ear normal.  Eyes:     General: No scleral icterus.       Right eye: No discharge.        Left eye: No discharge.     Conjunctiva/sclera: Conjunctivae normal.  Neck:     Thyroid: No thyromegaly.  Cardiovascular:     Rate and Rhythm: Normal rate and regular rhythm.  Pulmonary:     Effort: No respiratory distress.     Breath sounds: Normal breath sounds. No wheezing.  Abdominal:     General: Bowel sounds are normal.     Palpations: Abdomen is soft.     Tenderness: There is no abdominal tenderness.  Musculoskeletal:        General: No swelling or tenderness.     Cervical back: Neck supple. No tenderness.  Lymphadenopathy:     Cervical: No cervical adenopathy.  Skin:    Findings: No erythema or rash.  Neurological:     Mental Status: She is alert.  Psychiatric:        Mood and Affect: Mood normal.        Behavior: Behavior  normal.      Outpatient Encounter Medications as of 03/12/2023  Medication Sig   Lutein 20 MG TABS Take 20 mg by mouth daily.  mupirocin ointment (BACTROBAN) 2 % Apply 1 Application topically 2 (two) times daily.   amLODipine (NORVASC) 5 MG tablet Take 1 tablet (5 mg total) by mouth daily.   Calcium-Phosphorus-Vitamin D (CITRACAL +D3 PO) Take 2 tablets by mouth daily.   Cholecalciferol (D3-1000 PO) Take by mouth daily.   Clindamycin-Benzoyl Per, Refr, gel Apply to affected area 2 (two) times daily.   hydrochlorothiazide (MICROZIDE) 12.5 MG capsule Take 1 capsule (12.5 mg total) by mouth daily.   hydrocortisone 2.5 % cream Apply 1 Application to afffectead area under breast topically 2 (two) times daily until clear.   ibuprofen (ADVIL) 200 MG tablet Take 200 mg by mouth as needed.   Lutein-Zeaxanthin-Bilberry 20-4-40 MG CHEW Chew by mouth daily.   meloxicam (MOBIC) 15 MG tablet Take 1 tablet (15 mg total) by mouth daily.   metoprolol succinate (TOPROL-XL) 25 MG 24 hr tablet Take 0.5 tablets (12.5 mg total) by mouth daily.   Misc Natural Products (AIRBORNE ELDERBERRY) CHEW Chew 2 each by mouth daily. Nature's Bounty Elderberry Gummies   Misc Natural Products (LUTEIN 20 PO) Take 1 each by mouth daily.   Multiple Vitamins-Minerals (CENTRUM ADULTS PO) Take by mouth.   TART CHERRY PO Take by mouth.   triamcinolone cream (KENALOG) 0.1 % Apply 1 Application topically 2 (two) times daily until clear, then stop.   Turmeric (QC TUMERIC COMPLEX PO) Take by mouth.   No facility-administered encounter medications on file as of 03/12/2023.     Lab Results  Component Value Date   WBC 4.5 06/24/2022   HGB 12.1 06/24/2022   HCT 36.3 06/24/2022   PLT 204.0 06/24/2022   GLUCOSE 82 06/24/2022   CHOL 207 (H) 06/24/2022   TRIG 93.0 06/24/2022   HDL 51.50 06/24/2022   LDLCALC 137 (H) 06/24/2022   ALT 15 06/24/2022   AST 19 06/24/2022   NA 139 06/24/2022   K 3.8 06/24/2022   CL 102 06/24/2022    CREATININE 0.59 06/24/2022   BUN 21 06/24/2022   CO2 31 06/24/2022   TSH 1.69 06/24/2022   HGBA1C 5.9 06/24/2022    DG Bone Density  Result Date: 01/14/2023 EXAM: DUAL X-RAY ABSORPTIOMETRY (DXA) FOR BONE MINERAL DENSITY IMPRESSION: Your patient Lesley Atkin completed a BMD test on 01/14/2023 using the [employer] iDXA DXA System (software version: 14.10) manufactured by [employer]. The following summarizes the results of our evaluation. Technologist: SCE PATIENT BIOGRAPHICAL: Name: Karrigan, Messamore Patient ID: [id number] Birth Date: 10/15/1965 Height: 62.5 in. Gender: Female Exam Date: 01/14/2023 Weight: 176.7 lbs. Indications: Caucasian, History of Fracture (Adult), Osteoarthritis, Postmenopausal Fractures: Hand Left Treatments: calcium w/ vit D, meloxicam, Vitamin D DENSITOMETRY RESULTS: Site      Region        Measured Date Measured Age WHO Classification Young Adult T-score BMD         %Change vs. Previous Significant Change (*) AP Spine L1-L4 (L2,L3) 01/14/2023 56.4 Normal 1.8 1.402 g/cm2 - - DualFemur Neck Right 01/14/2023 56.4 Normal -0.5 0.973 g/cm2 - - ASSESSMENT: The BMD measured at Femur Neck Right is 0.973 g/cm2 with a T-score of -0.5. This patient is considered normal according to World Health Organization [facility]) criteria. The scan quality is good. L2 and L3 was excluded due to degenerative changes. World Health Organization [facility]) criteria for post-menopausal, Caucasian Women: Normal:                   T-score at or above -1 SD Osteopenia/low bone mass: T-score between -  1 and -2.5 SD Osteoporosis:             T-score at or below -2.5 SD RECOMMENDATIONS: 1. All patients should optimize calcium and vitamin D intake. 2. Consider FDA-approved medical therapies in postmenopausal women and men aged 104 years and older, based on the following: a. A hip or vertebral(clinical or morphometric) fracture b. T-score < -2.5 at the femoral neck or spine after appropriate evaluation to exclude  secondary causes c. Low bone mass (T-score between -1.0 and -2.5 at the femoral neck or spine) and a 10-year probability of a hip fracture > 3% or a 10-year probability of a major osteoporosis-related fracture > 20% based on the [place]-adapted WHO algorithm 3. Clinician judgment and/or patient preferences may indicate treatment for people with 10-year fracture probabilities above or below these levels FOLLOW-UP: People with diagnosed cases of osteoporosis or at high risk for fracture should have regular bone mineral density tests. For patients eligible for Medicare, routine testing is allowed once every 2 years. The testing frequency can be increased to one year for patients who have rapidly progressing disease, those who are receiving or discontinuing medical therapy to restore bone mass, or have additional risk factors. I have reviewed this report, and agree with the above findings. [facility] Electronically Signed   By: Bary Richard M.D.   On: 01/14/2023 09:04       Assessment & Plan:  Hypercholesteremia Assessment & Plan: The 10-year ASCVD risk score (Arnett DK, et al., 2019) is: 3%   Values used to calculate the score:     Age: 39 years     Sex: Female     Is Non-Hispanic African American: No     Diabetic: No     Tobacco smoker: No     Systolic Blood Pressure: 124 mmHg     Is BP treated: Yes     HDL Cholesterol: 51.5 mg/dL     Total Cholesterol: 207 mg/dL  Low cholesterol diet and exercise.  Follow lipid panel.   Orders: -     Basic metabolic panel; Future -     Lipid panel; Future -     Hepatic function panel; Future  Hyperglycemia Assessment & Plan: Low carb diet and exercise.  Follow met b and A1c.   Orders: -     Hemoglobin A1c; Future  Leg cramps Assessment & Plan: Leg cramps as outlined.  Stay hydrated.  Stretches. Check electrolytes, calcium and magnesium.    Orders: -     Magnesium; Future -     CBC with Differential/Platelet; Future  Benign  hypertension Assessment & Plan: On hctz, metoprolol and amlodipine.  Desired not to take ace inhibitor or ARB.   Pressure is better.  Continue current medication regimen.  Follow pressures.  Follow metabolic panel.     Hematuria, microscopic Assessment & Plan: Previously saw Dr Apolinar Junes.  She was initially seen last year for a microscopic hematuria workup in 05/2014 which demonstrated an incidental 2 mm left upper pole nonobstructing stone on CT urogram, cystoscopy was negative.    History of iron deficiency anemia Assessment & Plan: Follow cbc.    Patellofemoral syndrome of left knee Assessment & Plan: Followed by Dr Katrinka Blazing.  Appears to have improved.    Varicose veins of both lower extremities with pain Assessment & Plan: Saw Dr Wyn Quaker - 12/29/22 - Venous reflux study performed today demonstrates significant venous reflux in both great saphenous veins actually worse on the right than the left.  No DVT or superficial thrombophlebitis was seen. Discussed today. Discussed holding on venous treatments for now.    Other orders -     Mupirocin; Apply 1 Application topically 2 (two) times daily.  Dispense: 22 g; Refill: 0     Dale [place], MD

## 2023-03-16 ENCOUNTER — Encounter: Payer: Self-pay | Admitting: Internal Medicine

## 2023-03-16 DIAGNOSIS — R252 Cramp and spasm: Secondary | ICD-10-CM | POA: Insufficient documentation

## 2023-03-16 MED FILL — Amlodipine Besylate Tab 5 MG (Base Equivalent): ORAL | 30 days supply | Qty: 30 | Fill #3 | Status: AC

## 2023-03-16 NOTE — Assessment & Plan Note (Signed)
 Previously saw Dr Erlene Quan.  She was initially seen last year for a microscopic hematuria workup in 05/2014 which demonstrated an incidental 2 mm left upper pole nonobstructing stone on CT urogram, cystoscopy was negative.

## 2023-03-16 NOTE — Assessment & Plan Note (Signed)
The 10-year ASCVD risk score (Arnett DK, et al., 2019) is: 3%   Values used to calculate the score:     Age: 57 years     Sex: Female     Is Non-Hispanic African American: No     Diabetic: No     Tobacco smoker: No     Systolic Blood Pressure: 124 mmHg     Is BP treated: Yes     HDL Cholesterol: 51.5 mg/dL     Total Cholesterol: 207 mg/dL  Low cholesterol diet and exercise.  Follow lipid panel.

## 2023-03-16 NOTE — Assessment & Plan Note (Signed)
 On hctz, metoprolol and amlodipine.  Desired not to take ace inhibitor or ARB.   Pressure is better.  Continue current medication regimen.  Follow pressures.  Follow metabolic panel.

## 2023-03-16 NOTE — Assessment & Plan Note (Signed)
Leg cramps as outlined.  Stay hydrated.  Stretches. Check electrolytes, calcium and magnesium.

## 2023-03-16 NOTE — Assessment & Plan Note (Signed)
Low-carb diet and exercise.  Follow met b and A1c. ?

## 2023-03-16 NOTE — Assessment & Plan Note (Signed)
Follow cbc.  

## 2023-03-16 NOTE — Assessment & Plan Note (Signed)
Saw Dr Wyn Quaker - 12/29/22 - Venous reflux study performed today demonstrates significant venous reflux in both great saphenous veins actually worse on the right than the left. No DVT or superficial thrombophlebitis was seen. Discussed today. Discussed holding on venous treatments for now.

## 2023-03-16 NOTE — Assessment & Plan Note (Signed)
Followed by Dr Katrinka Blazing.  Appears to have improved.

## 2023-03-19 ENCOUNTER — Other Ambulatory Visit (INDEPENDENT_AMBULATORY_CARE_PROVIDER_SITE_OTHER): Payer: Commercial Managed Care - PPO

## 2023-03-19 DIAGNOSIS — R739 Hyperglycemia, unspecified: Secondary | ICD-10-CM | POA: Diagnosis not present

## 2023-03-19 DIAGNOSIS — R252 Cramp and spasm: Secondary | ICD-10-CM

## 2023-03-19 DIAGNOSIS — E7800 Pure hypercholesterolemia, unspecified: Secondary | ICD-10-CM | POA: Diagnosis not present

## 2023-03-19 LAB — CBC WITH DIFFERENTIAL/PLATELET
Basophils Absolute: 49 cells/uL (ref 0–200)
Eosinophils Relative: 7.6 %
HCT: 35.4 % (ref 35.0–45.0)
MCH: 29.5 pg (ref 27.0–33.0)
MCV: 90.1 fL (ref 80.0–100.0)
MPV: 11.7 fL (ref 7.5–12.5)
Neutrophils Relative %: 60.6 %
RBC: 3.93 10*6/uL (ref 3.80–5.10)
RDW: 12.9 % (ref 11.0–15.0)
Total Lymphocyte: 19 %

## 2023-03-19 NOTE — Addendum Note (Signed)
Addended by: Warden Fillers on: 03/19/2023 11:00 AM   Modules accepted: Orders

## 2023-03-20 LAB — BASIC METABOLIC PANEL WITH GFR
BUN: 21 mg/dL (ref 7–25)
CO2: 26 mmol/L (ref 20–32)
Calcium: 9.2 mg/dL (ref 8.6–10.4)
Chloride: 104 mmol/L (ref 98–110)
Creat: 0.7 mg/dL (ref 0.50–1.03)
Glucose, Bld: 80 mg/dL (ref 65–99)
Potassium: 3.6 mmol/L (ref 3.5–5.3)
Sodium: 141 mmol/L (ref 135–146)
eGFR: 101 mL/min/{1.73_m2} (ref >=60)

## 2023-03-20 LAB — LIPID PANEL
Cholesterol: 215 mg/dL — ABNORMAL HIGH (ref <200)
HDL: 56 mg/dL (ref >=50)
LDL Cholesterol (Calc): 142 mg/dL (calc) — ABNORMAL HIGH
Non-HDL Cholesterol (Calc): 159 mg/dL (calc) — ABNORMAL HIGH (ref <130)
Total CHOL/HDL Ratio: 3.8 (calc) (ref <5.0)
Triglycerides: 78 mg/dL (ref <150)

## 2023-03-20 LAB — HEPATIC FUNCTION PANEL
AG Ratio: 1.5 (calc) (ref 1.0–2.5)
ALT: 19 U/L (ref 6–29)
AST: 20 U/L (ref 10–35)
Albumin: 4 g/dL (ref 3.6–5.1)
Alkaline phosphatase (APISO): 68 U/L (ref 37–153)
Bilirubin, Direct: 0.1 mg/dL (ref 0.0–0.2)
Globulin: 2.6 g/dL (calc) (ref 1.9–3.7)
Indirect Bilirubin: 0.4 mg/dL (calc) (ref 0.2–1.2)
Total Bilirubin: 0.5 mg/dL (ref 0.2–1.2)
Total Protein: 6.6 g/dL (ref 6.1–8.1)

## 2023-03-20 LAB — CBC WITH DIFFERENTIAL/PLATELET
Absolute Monocytes: 578 cells/uL (ref 200–950)
Basophils Relative: 1 %
Eosinophils Absolute: 372 cells/uL (ref 15–500)
Hemoglobin: 11.6 g/dL — ABNORMAL LOW (ref 11.7–15.5)
Lymphs Abs: 931 cells/uL (ref 850–3900)
MCHC: 32.8 g/dL (ref 32.0–36.0)
Monocytes Relative: 11.8 %
Neutro Abs: 2969 cells/uL (ref 1500–7800)
Platelets: 229 10*3/uL (ref 140–400)
WBC: 4.9 10*3/uL (ref 3.8–10.8)

## 2023-03-20 LAB — HEMOGLOBIN A1C
Hgb A1c MFr Bld: 5.9 % of total Hgb — ABNORMAL HIGH (ref <5.7)
Mean Plasma Glucose: 123 mg/dL
eAG (mmol/L): 6.8 mmol/L

## 2023-03-20 LAB — MAGNESIUM: Magnesium: 2 mg/dL (ref 1.5–2.5)

## 2023-03-22 ENCOUNTER — Other Ambulatory Visit: Payer: Self-pay

## 2023-03-22 DIAGNOSIS — D649 Anemia, unspecified: Secondary | ICD-10-CM

## 2023-03-30 ENCOUNTER — Ambulatory Visit (INDEPENDENT_AMBULATORY_CARE_PROVIDER_SITE_OTHER): Payer: Commercial Managed Care - PPO | Admitting: Vascular Surgery

## 2023-04-07 ENCOUNTER — Other Ambulatory Visit: Payer: Self-pay | Admitting: Internal Medicine

## 2023-04-08 ENCOUNTER — Other Ambulatory Visit: Payer: Self-pay

## 2023-04-08 ENCOUNTER — Other Ambulatory Visit: Payer: Self-pay | Admitting: Internal Medicine

## 2023-04-08 MED FILL — Amlodipine Besylate Tab 5 MG (Base Equivalent): ORAL | 30 days supply | Qty: 30 | Fill #0 | Status: AC

## 2023-04-09 ENCOUNTER — Other Ambulatory Visit: Payer: Self-pay

## 2023-04-13 NOTE — Progress Notes (Unsigned)
[name] [address] [place] [address] Phone: [phone] Subjective:   [name], am serving as a scribe for Dr. Antoine Primas.  I'm seeing this patient by the request  of:  Dale [place], MD  CC: Left knee pain  [id number]  02/11/2023 Patient is doing significantly better.  Does feel like she has a Baker's cyst in the back of her knee.  Discussed the potential for aspiration but because she is feeling so good she wants to continue to monitor.  Discussed icing regimen and home exercises, discussed which activities to do and which ones to avoid.  Follow-up again 2 to 3 months      Update 04/15/2023 Sydney [name] is a 57 y.o. female coming in with complaint of L knee pain. Patient states that she was feeling really good and did a water aerobics class and swimming laps that increased her pain. Pain throughout the joint with swelling.    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    Past Medical History:  Diagnosis Date   Allergy    Anemia    Eczema    GERD (gastroesophageal reflux disease)    Hematuria    Hyperlipidemia    Hypertension    Kidney stone on left side    Lump in female breast    seen by Dr. Birdie Sons   Metabolic syndrome    Microscopic hematuria    Vitreous detachment of right eye 08/22/2019   Dr. 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  Tobacco Use   Smoking status: Never   Smokeless tobacco: Never  Vaping Use   Vaping status: Never Used  Substance and Sexual Activity   Alcohol use: No    Alcohol/week: 0.0 standard drinks of alcohol   Drug use: No   Sexual activity: Not Currently  Other Topics Concern   Not on file  Social History Narrative   Works for [employer], lives alone   She has one cat   Mother is in memory care now    Social Determinants of Health   Financial Resource Strain: Low Risk  (08/28/2021)   Overall Financial Resource Strain (CARDIA)    Difficulty of Paying Living Expenses: Not hard at all  Food Insecurity: No Food Insecurity (08/28/2021)   Hunger Vital Sign    Worried About Running Out of Food in the Last Year: Never true    Ran Out of Food in the Last Year: Never true  Transportation Needs: No Transportation Needs (08/28/2021)   PRAPARE - Administrator, Civil Service (Medical): No    Lack of Transportation (Non-Medical): No  Physical Activity: Inactive (08/28/2021)   Exercise Vital Sign    Days of Exercise per Week: 0 days    Minutes of Exercise per Session: 0 min  Stress: No Stress Concern Present (08/28/2021)   [employer] of Occupational Health - Occupational Stress Questionnaire    Feeling of Stress : Only a little  Social Connections: Moderately Integrated (08/28/2021)   Social Connection and Isolation Panel [NHANES]    Frequency of Communication with Friends and Family: Three times a week    Frequency of Social Gatherings with Friends and Family: Once a week    Attends Religious Services: More than 4 times per year    Active Member of [employer] or Organizations: Yes    Attends Engineer, structural: 1 to 4 times per year    Marital Status: Never married   Allergies  Allergen Reactions   Amoxicillin    Sulfa Antibiotics    Family History  Problem Relation Age of Onset   Hypertension Mother    Hyperlipidemia Mother    Dementia Mother    Diabetes  Father    CAD Father    Psoriasis Father    Heart disease Father    Cancer Father        Prostate and Multiple Myeloma   Asthma Sister    Thyroid disease Sister    Diabetes Sister    Breast cancer Neg Hx      Current Outpatient Medications (Cardiovascular):    amLODipine (NORVASC) 5 MG tablet, Take 1 tablet (5 mg total) by mouth daily.   hydrochlorothiazide (MICROZIDE) 12.5 MG capsule, Take 1 capsule (12.5 mg total) by mouth daily.   metoprolol succinate (TOPROL-XL) 25 MG 24 hr tablet, Take 0.5 tablets (12.5 mg total) by mouth daily.   Current Outpatient Medications (Analgesics):    ibuprofen (ADVIL) 200 MG tablet, Take 200 mg by mouth as needed.   meloxicam (MOBIC) 15 MG tablet, Take 1 tablet (15 mg total) by mouth daily.   Current Outpatient Medications (Other):    Calcium-Phosphorus-Vitamin D (CITRACAL +D3 PO), Take 2 tablets by mouth daily.   Cholecalciferol (D3-1000 PO), Take by mouth daily.   Clindamycin-Benzoyl Per, Refr, gel, Apply to affected area 2 (two) times daily.   hydrocortisone 2.5 % cream, Apply 1 Application to afffectead area under breast topically 2 (two) times daily until clear.   Lutein 20 MG TABS, Take 20 mg by mouth daily.   Lutein-Zeaxanthin-Bilberry 20-4-40 MG CHEW, Chew by mouth daily.   Misc Natural Products (AIRBORNE ELDERBERRY) CHEW, Chew 2 each by mouth daily. Nature's Bounty Elderberry Gummies   Misc Natural Products (LUTEIN 20 PO), Take 1 each by mouth daily.   Multiple Vitamins-Minerals (CENTRUM ADULTS PO), Take by mouth.   mupirocin ointment (BACTROBAN) 2 %, Apply 1 Application topically 2 (two) times daily.   TART CHERRY PO, Take by mouth.   triamcinolone cream (KENALOG) 0.1 %, Apply 1 Application topically 2 (two) times daily until clear, then stop.   Turmeric (QC TUMERIC COMPLEX PO), Take by mouth.   Reviewed prior external information including notes and imaging from  primary care provider As well as notes that were available from care  everywhere and other healthcare systems.  Past medical history, social, surgical and family history all reviewed in electronic medical record.  No pertanent information unless stated regarding to the chief complaint.   Review of Systems:  No headache, visual changes, nausea, vomiting, diarrhea, constipation, dizziness, abdominal pain, skin rash, fevers, chills, night sweats, weight loss, swollen lymph nodes, body aches, chest pain, shortness of breath, mood changes. POSITIVE muscle aches, Joint swelling  Objective  Blood pressure (!) 132/100, pulse 78, height 5\' 3"  (1.6  m), weight 177 lb (80.3 kg), last menstrual period 07/21/2015, SpO2 98%.   General: No apparent distress alert and oriented x3 mood and affect normal, dressed appropriately.  HEENT: Pupils equal, extraocular movements intact  Respiratory: Patient's speak in full sentences and does not appear short of breath  Cardiovascular: No lower extremity edema, non tender, no erythema   Left knee exam shows trace effusion noted.  Some mild crepitus noted.  Lacks last 10 degrees of flexion.  Full extension.  No significant instability noted otherwise   Impression and Recommendations:    The above documentation has been reviewed and is accurate and complete Judi Saa, DO

## 2023-04-15 ENCOUNTER — Ambulatory Visit: Payer: Commercial Managed Care - PPO | Admitting: Family Medicine

## 2023-04-15 ENCOUNTER — Encounter: Payer: Self-pay | Admitting: Family Medicine

## 2023-04-15 VITALS — BP 132/100 | HR 78 | Ht 63.0 in | Wt 177.0 lb

## 2023-04-15 DIAGNOSIS — M1712 Unilateral primary osteoarthritis, left knee: Secondary | ICD-10-CM | POA: Insufficient documentation

## 2023-04-15 NOTE — Patient Instructions (Signed)
Read about PRP Send MyChart message if you want to do it at next visit: Write on Monday Will get approval for gel

## 2023-04-15 NOTE — Assessment & Plan Note (Signed)
Discussed which activities to do and which ones to avoid.  Discussed home exercises and icing regimen.  Discussed the possibility of PRP which patient thought was more interesting than the steroid or the viscosupplementation at the moment.  Patient given handouts to further evaluate and reading.  Follow-up again in 6 to 8 weeks otherwise.

## 2023-04-19 ENCOUNTER — Other Ambulatory Visit (INDEPENDENT_AMBULATORY_CARE_PROVIDER_SITE_OTHER): Payer: Commercial Managed Care - PPO

## 2023-04-19 DIAGNOSIS — D649 Anemia, unspecified: Secondary | ICD-10-CM

## 2023-04-19 DIAGNOSIS — I10 Essential (primary) hypertension: Secondary | ICD-10-CM | POA: Diagnosis not present

## 2023-04-19 LAB — BASIC METABOLIC PANEL
BUN: 18 mg/dL (ref 6–23)
CO2: 28 mEq/L (ref 19–32)
Calcium: 9.4 mg/dL (ref 8.4–10.5)
Chloride: 105 mEq/L (ref 96–112)
Creatinine, Ser: 0.64 mg/dL (ref 0.40–1.20)
GFR: 98.57 mL/min (ref >60.00)
Glucose, Bld: 92 mg/dL (ref 70–99)
Potassium: 3.9 mEq/L (ref 3.5–5.1)
Sodium: 142 mEq/L (ref 135–145)

## 2023-04-19 LAB — IBC + FERRITIN
Ferritin: 28.1 ng/mL (ref 10.0–291.0)
Iron: 76 ug/dL (ref 42–145)
Saturation Ratios: 19.7 % — ABNORMAL LOW (ref 20.0–50.0)
TIBC: 385 ug/dL (ref 250.0–450.0)
Transferrin: 275 mg/dL (ref 212.0–360.0)

## 2023-04-19 LAB — CBC WITH DIFFERENTIAL/PLATELET
Basophils Absolute: 0.1 10*3/uL (ref 0.0–0.1)
Basophils Relative: 1.1 % (ref 0.0–3.0)
Eosinophils Absolute: 0.4 10*3/uL (ref 0.0–0.7)
Eosinophils Relative: 6.4 % — ABNORMAL HIGH (ref 0.0–5.0)
HCT: 37.1 % (ref 36.0–46.0)
Hemoglobin: 12 g/dL (ref 12.0–15.0)
Lymphocytes Relative: 15.6 % (ref 12.0–46.0)
Lymphs Abs: 0.9 10*3/uL (ref 0.7–4.0)
MCHC: 32.3 g/dL (ref 30.0–36.0)
MCV: 90.2 fl (ref 78.0–100.0)
Monocytes Absolute: 0.6 10*3/uL (ref 0.1–1.0)
Monocytes Relative: 10.5 % (ref 3.0–12.0)
Neutro Abs: 3.9 10*3/uL (ref 1.4–7.7)
Neutrophils Relative %: 66.4 % (ref 43.0–77.0)
Platelets: 243 10*3/uL (ref 150.0–400.0)
RBC: 4.12 Mil/uL (ref 3.87–5.11)
RDW: 13.6 % (ref 11.5–15.5)
WBC: 5.9 10*3/uL (ref 4.0–10.5)

## 2023-04-19 LAB — VITAMIN B12: Vitamin B-12: 1343 pg/mL — ABNORMAL HIGH (ref 211–911)

## 2023-04-20 ENCOUNTER — Telehealth: Payer: Self-pay

## 2023-04-20 NOTE — Telephone Encounter (Signed)
Unable to leave vm due to mailbox being full.  

## 2023-04-20 NOTE — Telephone Encounter (Signed)
-----   Message from Deary sent at 04/19/2023 11:47 PM EDT ----- Notify - hgb level - wnl.  B12 level elevated.  Need to confirm if taking B12 supplements.  If so, document how much she is taking.  Kidney function tests are wnl.

## 2023-04-22 ENCOUNTER — Encounter: Payer: Self-pay | Admitting: Family Medicine

## 2023-05-14 MED FILL — Amlodipine Besylate Tab 5 MG (Base Equivalent): ORAL | 30 days supply | Qty: 30 | Fill #1 | Status: AC

## 2023-05-17 NOTE — Progress Notes (Unsigned)
  [name] [address] [place] [address] Phone: [phone] Subjective:   [name], am serving as a scribe for Dr. Antoine Primas.  I'm seeing this patient by the request  of:  Dale [place], MD  CC: knee pain   [id number]  04/15/2023 Discussed which activities to do and which ones to avoid.  Discussed home exercises and icing regimen.  Discussed the possibility of PRP which patient thought was more interesting than the steroid or the viscosupplementation at the moment.  Patient given handouts to further evaluate and reading.  Follow-up again in 6 to 8 weeks otherwise.      Update 05/18/2023 Sydney [name] is a 57 y.o. female coming in with complaint of L knee pain. Patient states      Objective  Blood pressure 124/82, pulse 90, height 5\' 3"  (1.6 m), weight 176 lb (79.8 kg), last menstrual period 07/21/2015, SpO2 97%.   General: No apparent distress alert and oriented x3 mood and affect normal, dressed appropriately.   After informed written and verbal consent, patient was seated on exam table. Left knee was prepped with alcohol swab and utilizing anterolateral approach, patient's left knee space was injected with with a 21-gauge 2 inch needle injected with 0.5 cc 0.5% Marcaine and then injected with 5 cc of PRP leukocyte poor. Patient tolerated the procedure well without immediate complications.    Impression and Recommendations:     The above documentation has been reviewed and is accurate and complete Judi Saa, DO

## 2023-05-19 ENCOUNTER — Encounter: Payer: Self-pay | Admitting: Family Medicine

## 2023-05-19 ENCOUNTER — Ambulatory Visit (INDEPENDENT_AMBULATORY_CARE_PROVIDER_SITE_OTHER): Payer: Self-pay | Admitting: Family Medicine

## 2023-05-19 VITALS — BP 124/82 | HR 90 | Ht 63.0 in | Wt 176.0 lb

## 2023-05-19 DIAGNOSIS — M222X2 Patellofemoral disorders, left knee: Secondary | ICD-10-CM

## 2023-05-19 NOTE — Patient Instructions (Signed)
No ice or IBU for 3 days Heat and Tylenol are ok See me again in 6 weeks

## 2023-05-19 NOTE — Assessment & Plan Note (Signed)
Chronic problem worsening symptoms.  PRP injection given today.  Leukocyte poor.  Post PRP instructions given.  Follow-up with me again in 6 weeks otherwise.

## 2023-05-21 ENCOUNTER — Telehealth: Payer: Self-pay | Admitting: Family Medicine

## 2023-05-21 NOTE — Telephone Encounter (Signed)
error 

## 2023-06-18 ENCOUNTER — Other Ambulatory Visit: Payer: Self-pay

## 2023-06-18 MED FILL — Amlodipine Besylate Tab 5 MG (Base Equivalent): ORAL | 30 days supply | Qty: 30 | Fill #2 | Status: AC

## 2023-06-29 NOTE — Progress Notes (Unsigned)
[name] [address] [place] [address] Phone: [phone] Subjective:   [name], am serving as a scribe for Dr. Antoine Primas.  I'm seeing this patient by the request  of:  Dale Oasis, MD  CC: left knee pain   [id number]  05/19/2023 Chronic problem worsening symptoms.  PRP injection given today.  Leukocyte poor.  Post PRP instructions given.  Follow-up with me again in 6 weeks otherwise.     Update 06/30/2023 Sydney [name] is a 57 y.o. female coming in with complaint of L knee pain. Patient states that she feels a lot better. Pain over medial aspect and over patellar tendon when pain is present.   Also wore out her recovery sandals and she fels that she may have compensated and caused some of the medial knee.       Past Medical History:  Diagnosis Date   Allergy    Anemia    Eczema    GERD (gastroesophageal reflux disease)    Hematuria    Hyperlipidemia    Hypertension    Kidney stone on left side    Lump in female breast    seen by Dr. Birdie Sons   Metabolic syndrome    Microscopic hematuria    Vitreous detachment of right eye 08/22/2019   Dr. 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  Tobacco Use   Smoking status: Never   Smokeless tobacco: Never  Vaping Use   Vaping status: Never Used  Substance and Sexual Activity   Alcohol use: No    Alcohol/week: 0.0 standard drinks of alcohol   Drug use: No   Sexual activity: Not Currently  Other Topics Concern   Not on file   Social History Narrative   Works for [employer], lives alone   She has one cat   Mother is in memory care now    Social Determinants of Health   Financial Resource Strain: Low Risk  (08/28/2021)   Overall Financial Resource Strain (CARDIA)    Difficulty of Paying Living Expenses: Not hard at all  Food Insecurity: No Food Insecurity (08/28/2021)   Hunger Vital Sign    Worried About Running Out of Food in the Last Year: Never true    Ran Out of Food in the Last Year: Never true  Transportation Needs: No Transportation Needs (08/28/2021)   PRAPARE - Administrator, Civil Service (Medical): No    Lack of Transportation (Non-Medical): No  Physical Activity: Inactive (08/28/2021)   Exercise Vital Sign    Days of Exercise per Week: 0 days    Minutes of Exercise per Session: 0 min  Stress: No Stress Concern Present (08/28/2021)   [employer] of Occupational Health - Occupational Stress Questionnaire    Feeling of Stress : Only a little  Social Connections: Moderately Integrated (08/28/2021)   Social Connection and Isolation Panel [NHANES]    Frequency of Communication with Friends and Family: Three times  a week    Frequency of Social Gatherings with Friends and Family: Once a week    Attends Religious Services: More than 4 times per year    Active Member of [employer] or Organizations: Yes    Attends Engineer, structural: 1 to 4 times per year    Marital Status: Never married   Allergies  Allergen Reactions   Amoxicillin    Sulfa Antibiotics    Family History  Problem Relation Age of Onset   Hypertension Mother    Hyperlipidemia Mother    Dementia Mother    Diabetes Father    CAD Father    Psoriasis Father    Heart disease Father    Cancer Father        Prostate and Multiple Myeloma   Asthma Sister    Thyroid disease Sister    Diabetes Sister    Breast cancer Neg Hx      Current Outpatient Medications (Cardiovascular):    amLODipine (NORVASC) 5 MG  tablet, Take 1 tablet (5 mg total) by mouth daily.   hydrochlorothiazide (MICROZIDE) 12.5 MG capsule, Take 1 capsule (12.5 mg total) by mouth daily.   metoprolol succinate (TOPROL-XL) 25 MG 24 hr tablet, Take 0.5 tablets (12.5 mg total) by mouth daily.   Current Outpatient Medications (Analgesics):    ibuprofen (ADVIL) 200 MG tablet, Take 200 mg by mouth as needed.   meloxicam (MOBIC) 15 MG tablet, Take 1 tablet (15 mg total) by mouth daily.   Current Outpatient Medications (Other):    Calcium-Phosphorus-Vitamin D (CITRACAL +D3 PO), Take 2 tablets by mouth daily.   Cholecalciferol (D3-1000 PO), Take by mouth daily.   Clindamycin-Benzoyl Per, Refr, gel, Apply to affected area 2 (two) times daily.   hydrocortisone 2.5 % cream, Apply 1 Application to afffectead area under breast topically 2 (two) times daily until clear.   Lutein 20 MG TABS, Take 20 mg by mouth daily.   Lutein-Zeaxanthin-Bilberry 20-4-40 MG CHEW, Chew by mouth daily.   Misc Natural Products (AIRBORNE ELDERBERRY) CHEW, Chew 2 each by mouth daily. Nature's Bounty Elderberry Gummies   Misc Natural Products (LUTEIN 20 PO), Take 1 each by mouth daily.   Multiple Vitamins-Minerals (CENTRUM ADULTS PO), Take by mouth.   mupirocin ointment (BACTROBAN) 2 %, Apply 1 Application topically 2 (two) times daily.   TART CHERRY PO, Take by mouth.   triamcinolone cream (KENALOG) 0.1 %, Apply 1 Application topically 2 (two) times daily until clear, then stop.   Turmeric (QC TUMERIC COMPLEX PO), Take by mouth.   Reviewed prior external information including notes and imaging from  primary care provider As well as notes that were available from care everywhere and other healthcare systems.  Past medical history, social, surgical and family history all reviewed in electronic medical record.  No pertanent information unless stated regarding to the chief complaint.   Review of Systems:  No headache, visual changes, nausea, vomiting, diarrhea,  constipation, dizziness, abdominal pain, skin rash, fevers, chills, night sweats, weight loss, swollen lymph nodes, body aches, joint swelling, chest pain, shortness of breath, mood changes. POSITIVE muscle aches  Objective  Blood pressure 124/84, pulse 91, height 5\' 3"  (1.6 m), weight 178 lb (80.7 kg), last menstrual period 07/21/2015, SpO2 98%.   General: No apparent distress alert and oriented x3 mood and affect normal, dressed appropriately.  HEENT: Pupils equal, extraocular movements intact  Respiratory: Patient's speak in full sentences and does not appear short of breath  Cardiovascular: No lower extremity  edema, non tender, no erythema  Left knee exam shows not as severely tender.  Still has some arthritic changes.  Improvement in range of motion.  Very minimal pain with McMurray's  Limited muscular skeletal ultrasound was performed and interpreted by Antoine Primas, M  Limited ultrasound shows trace hypoechoic changes in the patellofemoral joints noted. Patient does have some degenerative changes noted of the medial meniscus and the narrowing of the medial joint space. Impression: Some underlying arthritis but improvement of the meniscus  After informed written and verbal consent, patient was seated on exam table. Left knee was prepped with alcohol swab and utilizing anterolateral approach, patient's left knee space was injected with 4:1  marcaine 0.5%: Kenalog 40mg /dL. Patient tolerated the procedure well without immediate complications.   Impression and Recommendations:     The above documentation has been reviewed and is accurate and complete Judi Saa, DO

## 2023-06-30 ENCOUNTER — Other Ambulatory Visit: Payer: Self-pay

## 2023-06-30 ENCOUNTER — Encounter: Payer: Self-pay | Admitting: Family Medicine

## 2023-06-30 ENCOUNTER — Ambulatory Visit: Payer: Commercial Managed Care - PPO | Admitting: Family Medicine

## 2023-06-30 VITALS — BP 124/84 | HR 91 | Ht 63.0 in | Wt 178.0 lb

## 2023-06-30 DIAGNOSIS — M25562 Pain in left knee: Secondary | ICD-10-CM

## 2023-06-30 DIAGNOSIS — M1712 Unilateral primary osteoarthritis, left knee: Secondary | ICD-10-CM | POA: Diagnosis not present

## 2023-06-30 NOTE — Assessment & Plan Note (Signed)
Seems to be improving at this time.  We could do a potential injection if necessary.  Patient will continue to stay active otherwise.  Discussed the possibility of steroid injections when needed.  Patient wants to avoid any surgical intervention.  PRP seems to have help but does have some very mild hypoechoic changes so did have another injection.

## 2023-06-30 NOTE — Patient Instructions (Signed)
Doing great See me again in 3 months

## 2023-07-06 ENCOUNTER — Ambulatory Visit (INDEPENDENT_AMBULATORY_CARE_PROVIDER_SITE_OTHER): Payer: Commercial Managed Care - PPO | Admitting: Vascular Surgery

## 2023-07-11 IMAGING — DX DG ANKLE COMPLETE 3+V*R*
3 series · 3 of 3 positions shown · non-contrast
Comparison: No recent.

CLINICAL DATA: Right ankle pain and swelling.  No known injury.

EXAM:
RIGHT ANKLE - COMPLETE 3+ VIEW

[ankle ap]
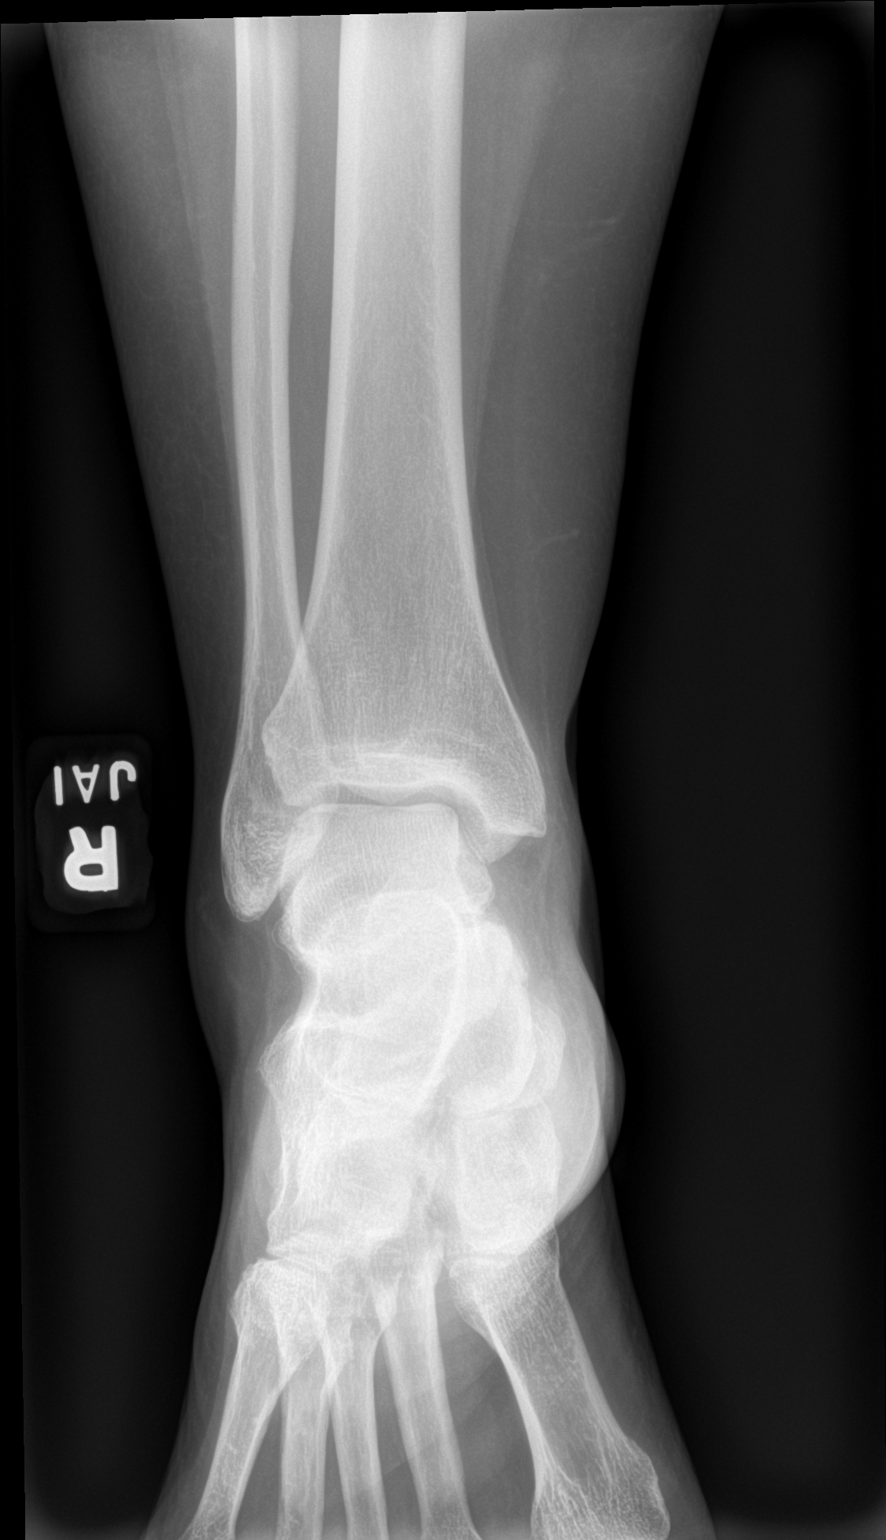

[ankle obl]
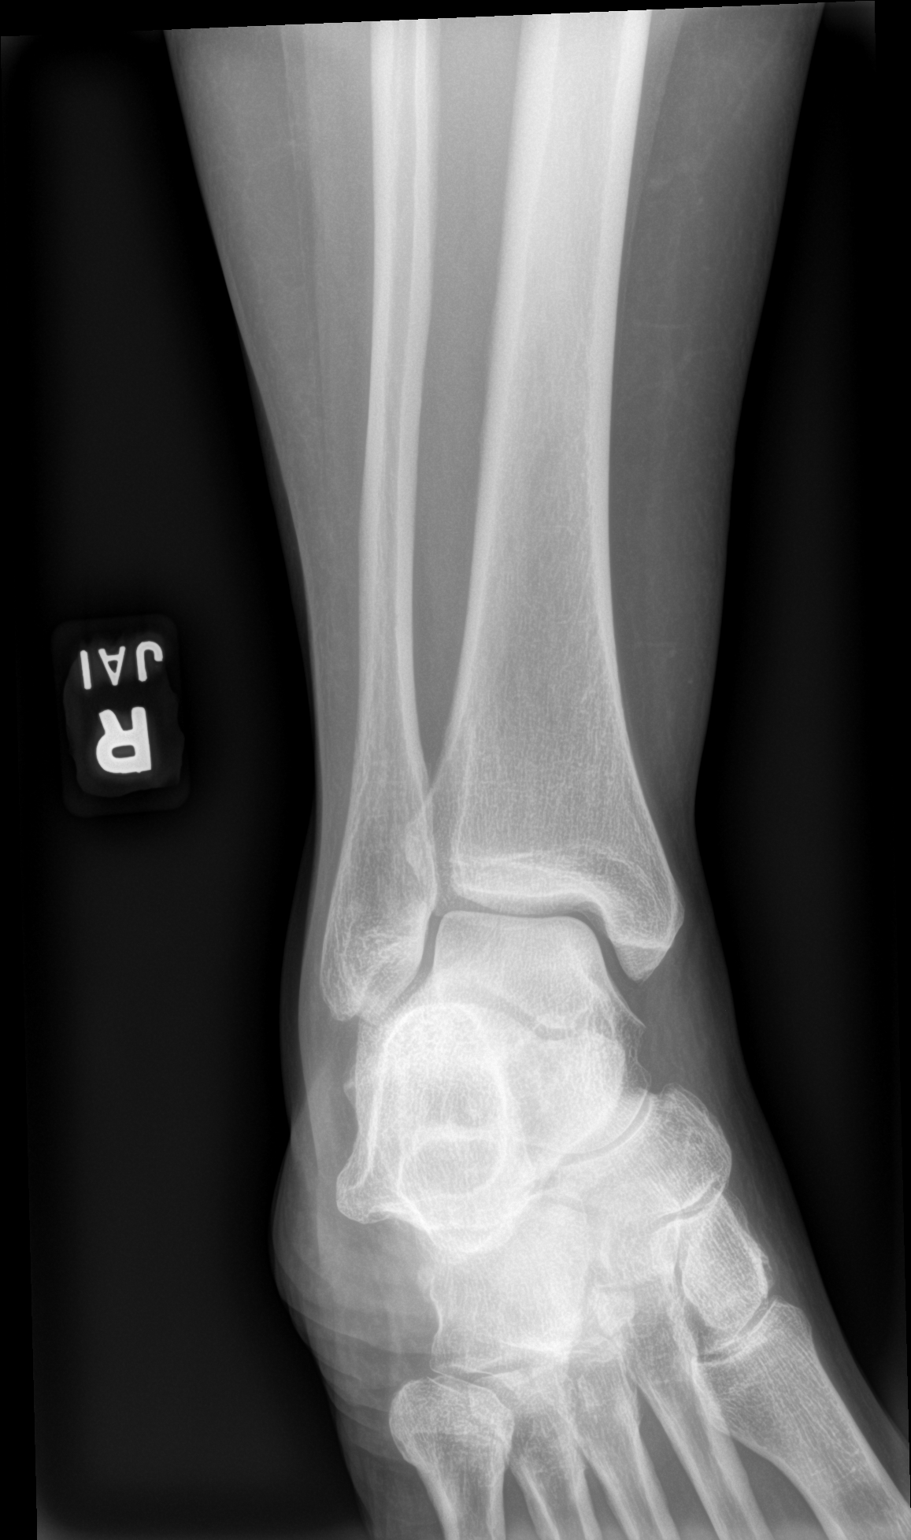

[ankle lat]
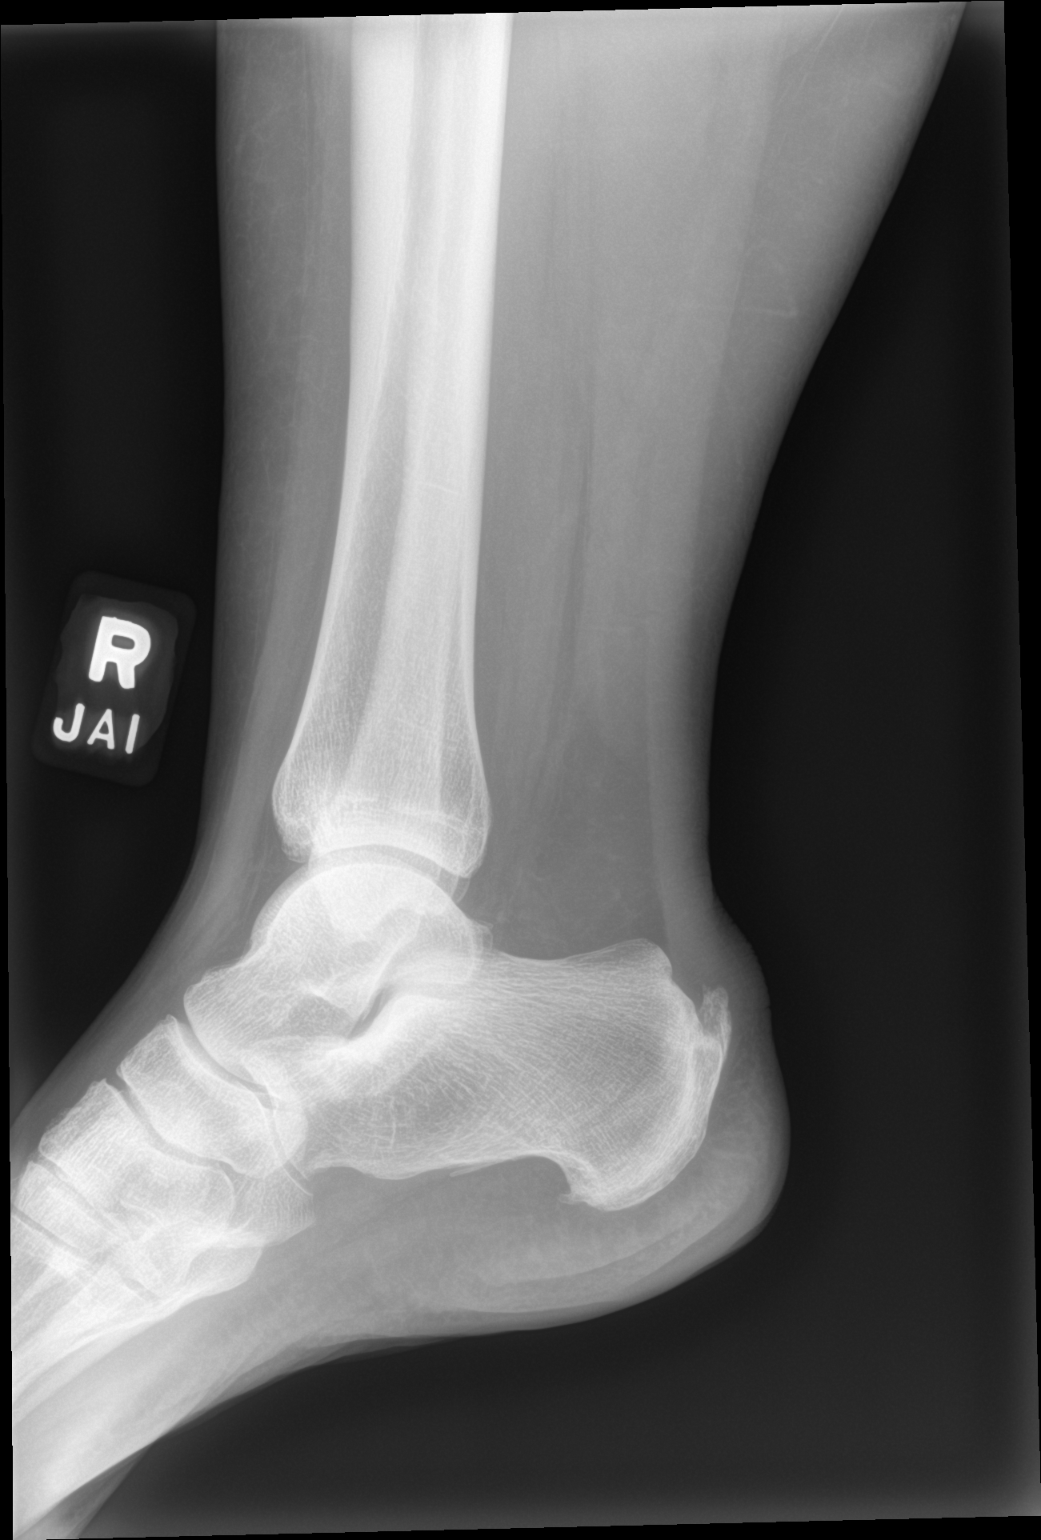

[3 of 3 positions shown; findings below may reference images not displayed]

FINDINGS: Diffuse soft tissue swelling. No acute bony or joint abnormality
identified. No evidence of fracture or dislocation.
IMPRESSION: Diffuse soft tissue swelling.  No acute bony abnormality.

## 2023-07-13 ENCOUNTER — Other Ambulatory Visit: Payer: Self-pay

## 2023-07-13 ENCOUNTER — Ambulatory Visit: Payer: Commercial Managed Care - PPO | Admitting: Internal Medicine

## 2023-07-13 ENCOUNTER — Encounter: Payer: Self-pay | Admitting: Internal Medicine

## 2023-07-13 VITALS — BP 128/74 | HR 86 | Temp 98.0°F | Resp 16 | Ht 63.0 in | Wt 176.4 lb

## 2023-07-13 DIAGNOSIS — M1712 Unilateral primary osteoarthritis, left knee: Secondary | ICD-10-CM

## 2023-07-13 DIAGNOSIS — E7800 Pure hypercholesterolemia, unspecified: Secondary | ICD-10-CM | POA: Diagnosis not present

## 2023-07-13 DIAGNOSIS — I10 Essential (primary) hypertension: Secondary | ICD-10-CM

## 2023-07-13 DIAGNOSIS — R739 Hyperglycemia, unspecified: Secondary | ICD-10-CM | POA: Diagnosis not present

## 2023-07-13 MED ORDER — CARVEDILOL 3.125 MG PO TABS
3.1250 mg | ORAL_TABLET | Freq: Two times a day (BID) | ORAL | 3 refills | Status: DC
  Filled 2023-07-13: qty 60, 30d supply, fill #0
  Filled 2023-08-15: qty 60, 30d supply, fill #1
  Filled 2023-09-15: qty 60, 30d supply, fill #2
  Filled 2023-10-11: qty 60, 30d supply, fill #3

## 2023-07-13 MED ORDER — HYDROCHLOROTHIAZIDE 12.5 MG PO CAPS
12.5000 mg | ORAL_CAPSULE | Freq: Every day | ORAL | 1 refills | Status: DC
  Filled 2023-07-13 – 2023-08-15 (×2): qty 90, 90d supply, fill #0
  Filled 2023-11-10: qty 90, 90d supply, fill #1

## 2023-07-13 NOTE — Progress Notes (Signed)
Subjective:    Patient ID: Sydney [name], female    DOB: 15-Jul-1966, 57 y.o.   MRN: [id number]  Patient here for  Chief Complaint  Patient presents with   Medical Management of Chronic Issues    HPI Here to follow up regarding her blood pressure. Seeing Dr Katrinka Blazing for her knee - left knee pain.  Knee is doing better. S/p injection.  Continues to follow up with Dr Katrinka Blazing.  She is concerned regarding remaining on amlodipine. Concerned with increased possibility of glaucoma. Wants to change medication.  Declines to take ace inhibitor and ARBs.  Discussed other options.  Discussed carvedilol.  This would take the place of there metoprolol.  Also reports some intermittent sinus pressure and congestion.  Discussed saline nasal spray.  Does not feel needs any further intervention.    Past Medical History:  Diagnosis Date   Allergy    Anemia    Eczema    GERD (gastroesophageal reflux disease)    Hematuria    Hyperlipidemia    Hypertension    Kidney stone on left side    Lump in female breast    seen by Dr. Birdie Sons   Metabolic syndrome    Microscopic hematuria    Vitreous detachment of right eye 08/22/2019   Dr. Druscilla Brownie,  [facility]   Past Surgical History:  Procedure Laterality Date   oral surgery  Left 10/2020   molar tooth implant    URETHRAL DILATION  Age 75   Family History  Problem Relation Age of Onset   Hypertension Mother    Hyperlipidemia Mother    Dementia Mother    Diabetes Father    CAD Father    Psoriasis Father    Heart disease Father    Cancer Father        Prostate and Multiple Myeloma   Asthma Sister    Thyroid disease Sister    Diabetes Sister    Breast cancer Neg Hx    Social History   Socioeconomic History   Marital status: Single    Spouse name: Not on file   Number of children: 0   Years of education: Not on file   Highest education level: Bachelor's degree (e.g., BA, AB, BS)  Occupational History    Employer: [employer]     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  Tobacco Use   Smoking status: Never   Smokeless tobacco: Never  Vaping Use   Vaping status: Never Used  Substance and Sexual Activity   Alcohol use: No    Alcohol/week: 0.0 standard drinks of alcohol   Drug use: No   Sexual activity: Not Currently  Other Topics Concern   Not on file  Social History Narrative   Works for [employer], lives alone   She has one cat   Mother is in memory care now    Social Determinants of Health   Financial Resource Strain: Low Risk  (08/28/2021)   Overall Financial Resource Strain (CARDIA)    Difficulty of Paying Living Expenses: Not hard at all  Food Insecurity: No Food Insecurity (08/28/2021)   Hunger Vital Sign    Worried About Running Out of Food in the Last Year: Never true    Ran Out of Food in the Last Year: Never true  Transportation Needs: No Transportation Needs (08/28/2021)   PRAPARE - Administrator, Civil Service (Medical): No    Lack of Transportation (Non-Medical): No  Physical Activity: Inactive (08/28/2021)  Exercise Vital Sign    Days of Exercise per Week: 0 days    Minutes of Exercise per Session: 0 min  Stress: No Stress Concern Present (08/28/2021)   [employer] of Occupational Health - Occupational Stress Questionnaire    Feeling of Stress : Only a little  Social Connections: Moderately Integrated (08/28/2021)   Social Connection and Isolation Panel [NHANES]    Frequency of Communication with Friends and Family: Three times a week    Frequency of Social Gatherings with Friends and Family: Once a week    Attends Religious Services: More than 4 times per year    Active Member of [employer] or Organizations: Yes    Attends Banker Meetings: 1 to 4 times per year    Marital Status: Never married     Review of Systems  Constitutional:  Negative for appetite change and unexpected weight change.  HENT:  Positive for congestion and sinus pressure.   Respiratory:   Negative for cough, chest tightness and shortness of breath.   Cardiovascular:  Negative for chest pain and palpitations.  Gastrointestinal:  Negative for abdominal pain, diarrhea, nausea and vomiting.  Genitourinary:  Negative for difficulty urinating and dysuria.  Musculoskeletal:  Negative for joint swelling and myalgias.  Skin:  Negative for color change and rash.  Neurological:  Negative for dizziness and headaches.  Psychiatric/Behavioral:  Negative for agitation and dysphoric mood.        Objective:     BP 128/74   Pulse 86   Temp 98 F (36.7 C)   Resp 16   Ht 5\' 3"  (1.6 m)   Wt 176 lb 6.4 oz (80 kg)   LMP 07/21/2015 (Approximate)   SpO2 97%   BMI 31.25 kg/m  Wt Readings from Last 3 Encounters:  07/13/23 176 lb 6.4 oz (80 kg)  06/30/23 178 lb (80.7 kg)  05/19/23 176 lb (79.8 kg)    Physical Exam Vitals reviewed.  Constitutional:      General: She is not in acute distress.    Appearance: Normal appearance.  HENT:     Head: Normocephalic and atraumatic.     Right Ear: External ear normal.     Left Ear: External ear normal.  Eyes:     General: No scleral icterus.       Right eye: No discharge.        Left eye: No discharge.     Conjunctiva/sclera: Conjunctivae normal.  Neck:     Thyroid: No thyromegaly.  Cardiovascular:     Rate and Rhythm: Normal rate and regular rhythm.  Pulmonary:     Effort: No respiratory distress.     Breath sounds: Normal breath sounds. No wheezing.  Abdominal:     General: Bowel sounds are normal.     Palpations: Abdomen is soft.     Tenderness: There is no abdominal tenderness.  Musculoskeletal:        General: No swelling or tenderness.     Cervical back: Neck supple. No tenderness.  Lymphadenopathy:     Cervical: No cervical adenopathy.  Skin:    Findings: No erythema or rash.  Neurological:     Mental Status: She is alert.  Psychiatric:        Mood and Affect: Mood normal.        Behavior: Behavior normal.       Outpatient Encounter Medications as of 07/13/2023  Medication Sig   carvedilol (COREG) 3.125 MG tablet Take 1 tablet (3.125 mg  total) by mouth 2 (two) times daily with a meal.   Calcium-Phosphorus-Vitamin D (CITRACAL +D3 PO) Take 2 tablets by mouth daily.   Cholecalciferol (D3-1000 PO) Take by mouth daily.   Clindamycin-Benzoyl Per, Refr, gel Apply to affected area 2 (two) times daily.   hydrochlorothiazide (MICROZIDE) 12.5 MG capsule Take 1 capsule (12.5 mg total) by mouth daily.   hydrocortisone 2.5 % cream Apply 1 Application to afffectead area under breast topically 2 (two) times daily until clear.   ibuprofen (ADVIL) 200 MG tablet Take 200 mg by mouth as needed.   Lutein 20 MG TABS Take 20 mg by mouth daily.   Lutein-Zeaxanthin-Bilberry 20-4-40 MG CHEW Chew by mouth daily.   Misc Natural Products (AIRBORNE ELDERBERRY) CHEW Chew 2 each by mouth daily. Nature's Bounty Elderberry Gummies   Misc Natural Products (LUTEIN 20 PO) Take 1 each by mouth daily.   Multiple Vitamins-Minerals (CENTRUM ADULTS PO) Take by mouth.   mupirocin ointment (BACTROBAN) 2 % Apply 1 Application topically 2 (two) times daily.   TART CHERRY PO Take by mouth.   Turmeric (QC TUMERIC COMPLEX PO) Take by mouth.   [DISCONTINUED] amLODipine (NORVASC) 5 MG tablet Take 1 tablet (5 mg total) by mouth daily.   [DISCONTINUED] hydrochlorothiazide (MICROZIDE) 12.5 MG capsule Take 1 capsule (12.5 mg total) by mouth daily.   [DISCONTINUED] meloxicam (MOBIC) 15 MG tablet Take 1 tablet (15 mg total) by mouth daily.   [DISCONTINUED] metoprolol succinate (TOPROL-XL) 25 MG 24 hr tablet Take 0.5 tablets (12.5 mg total) by mouth daily.   [DISCONTINUED] triamcinolone cream (KENALOG) 0.1 % Apply 1 Application topically 2 (two) times daily until clear, then stop.   No facility-administered encounter medications on file as of 07/13/2023.     Lab Results  Component Value Date   WBC 5.9 04/19/2023   HGB 12.0 04/19/2023   HCT  37.1 04/19/2023   PLT 243.0 04/19/2023   GLUCOSE 92 04/19/2023   CHOL 215 (H) 03/19/2023   TRIG 78 03/19/2023   HDL 56 03/19/2023   LDLCALC 142 (H) 03/19/2023   ALT 19 03/19/2023   AST 20 03/19/2023   NA 142 04/19/2023   K 3.9 04/19/2023   CL 105 04/19/2023   CREATININE 0.64 04/19/2023   BUN 18 04/19/2023   CO2 28 04/19/2023   TSH 1.69 06/24/2022   HGBA1C 5.9 (H) 03/19/2023    DG Bone Density  Result Date: 01/14/2023 EXAM: DUAL X-RAY ABSORPTIOMETRY (DXA) FOR BONE MINERAL DENSITY IMPRESSION: Your patient Madilee Gronau completed a BMD test on 01/14/2023 using the [employer] iDXA DXA System (software version: 14.10) manufactured by [employer]. The following summarizes the results of our evaluation. Technologist: SCE PATIENT BIOGRAPHICAL: Name: Kaleeya, Meehl Patient ID: [id number] Birth Date: July 11, 1966 Height: 62.5 in. Gender: Female Exam Date: 01/14/2023 Weight: 176.7 lbs. Indications: Caucasian, History of Fracture (Adult), Osteoarthritis, Postmenopausal Fractures: Hand Left Treatments: calcium w/ vit D, meloxicam, Vitamin D DENSITOMETRY RESULTS: Site      Region        Measured Date Measured Age WHO Classification Young Adult T-score BMD         %Change vs. Previous Significant Change (*) AP Spine L1-L4 (L2,L3) 01/14/2023 56.4 Normal 1.8 1.402 g/cm2 - - DualFemur Neck Right 01/14/2023 56.4 Normal -0.5 0.973 g/cm2 - - ASSESSMENT: The BMD measured at Femur Neck Right is 0.973 g/cm2 with a T-score of -0.5. This patient is considered normal according to World Health Organization [facility]) criteria. The scan quality is good. L2 and  L3 was excluded due to degenerative changes. World Science writer [facility]) criteria for post-menopausal, Caucasian Women: Normal:                   T-score at or above -1 SD Osteopenia/low bone mass: T-score between -1 and -2.5 SD Osteoporosis:             T-score at or below -2.5 SD RECOMMENDATIONS: 1. All patients should optimize calcium and vitamin D  intake. 2. Consider FDA-approved medical therapies in postmenopausal women and men aged 16 years and older, based on the following: a. A hip or vertebral(clinical or morphometric) fracture b. T-score < -2.5 at the femoral neck or spine after appropriate evaluation to exclude secondary causes c. Low bone mass (T-score between -1.0 and -2.5 at the femoral neck or spine) and a 10-year probability of a hip fracture > 3% or a 10-year probability of a major osteoporosis-related fracture > 20% based on the [place]-adapted WHO algorithm 3. Clinician judgment and/or patient preferences may indicate treatment for people with 10-year fracture probabilities above or below these levels FOLLOW-UP: People with diagnosed cases of osteoporosis or at high risk for fracture should have regular bone mineral density tests. For patients eligible for Medicare, routine testing is allowed once every 2 years. The testing frequency can be increased to one year for patients who have rapidly progressing disease, those who are receiving or discontinuing medical therapy to restore bone mass, or have additional risk factors. I have reviewed this report, and agree with the above findings. [facility] Electronically Signed   By: Bary Richard M.D.   On: 01/14/2023 09:04       Assessment & Plan:  Benign hypertension Assessment & Plan: On hctz, metoprolol and amlodipine.  Declines to take ace inhibitor or ARB.   Pressure is better and doing well. She wants to change amlodipine, given possible increased issue with glaucoma.  No history of glaucoma. Will stop amlodipine.  Stop metoprolol.  Start carvedilol 3.125mg  bid.  Follow pressures.  Follow metabolic panel.     Primary osteoarthritis of left knee Assessment & Plan: Seeing Dr Katrinka Blazing.  S/p steroid injection 06/30/23. Knee is doing better.  Continue f/u with Dr Katrinka Blazing.    Hypercholesteremia Assessment & Plan: The 10-year ASCVD risk score (Arnett DK, et al., 2019) is: 3.1%    Values used to calculate the score:     Age: 68 years     Sex: Female     Is Non-Hispanic African American: No     Diabetic: No     Tobacco smoker: No     Systolic Blood Pressure: 128 mmHg     Is BP treated: Yes     HDL Cholesterol: 56 mg/dL     Total Cholesterol: 215 mg/dL  Low cholesterol diet and exercise.  Follow lipid panel.    Hyperglycemia Assessment & Plan: Low carb diet and exercise.  Follow met b and A1c.    Other orders -     hydroCHLOROthiazide; Take 1 capsule (12.5 mg total) by mouth daily.  Dispense: 90 capsule; Refill: 1 -     Carvedilol; Take 1 tablet (3.125 mg total) by mouth 2 (two) times daily with a meal.  Dispense: 60 tablet; Refill: 3     Dale Overbrook, MD

## 2023-07-18 ENCOUNTER — Encounter: Payer: Self-pay | Admitting: Internal Medicine

## 2023-07-18 NOTE — Assessment & Plan Note (Signed)
Seeing Dr Katrinka Blazing.  S/p steroid injection 06/30/23. Knee is doing better.  Continue f/u with Dr Katrinka Blazing.

## 2023-07-18 NOTE — Assessment & Plan Note (Signed)
The 10-year ASCVD risk score (Arnett DK, et al., 2019) is: 3.1%   Values used to calculate the score:     Age: 57 years     Sex: Female     Is Non-Hispanic African American: No     Diabetic: No     Tobacco smoker: No     Systolic Blood Pressure: 128 mmHg     Is BP treated: Yes     HDL Cholesterol: 56 mg/dL     Total Cholesterol: 215 mg/dL  Low cholesterol diet and exercise.  Follow lipid panel.

## 2023-07-18 NOTE — Assessment & Plan Note (Signed)
On hctz, metoprolol and amlodipine.  Declines to take ace inhibitor or ARB.   Pressure is better and doing well. She wants to change amlodipine, given possible increased issue with glaucoma.  No history of glaucoma. Will stop amlodipine.  Stop metoprolol.  Start carvedilol 3.125mg  bid.  Follow pressures.  Follow metabolic panel.

## 2023-07-18 NOTE — Assessment & Plan Note (Signed)
Low-carb diet and exercise.  Follow met b and A1c. ?

## 2023-07-20 ENCOUNTER — Encounter: Payer: Self-pay | Admitting: Family

## 2023-07-20 ENCOUNTER — Ambulatory Visit: Payer: Commercial Managed Care - PPO | Admitting: Family

## 2023-07-20 ENCOUNTER — Ambulatory Visit (INDEPENDENT_AMBULATORY_CARE_PROVIDER_SITE_OTHER): Payer: Commercial Managed Care - PPO

## 2023-07-20 ENCOUNTER — Other Ambulatory Visit: Payer: Self-pay | Admitting: Family

## 2023-07-20 VITALS — BP 140/90 | HR 97 | Temp 97.8°F | Ht 63.0 in

## 2023-07-20 DIAGNOSIS — R200 Anesthesia of skin: Secondary | ICD-10-CM

## 2023-07-20 DIAGNOSIS — I10 Essential (primary) hypertension: Secondary | ICD-10-CM

## 2023-07-20 DIAGNOSIS — F430 Acute stress reaction: Secondary | ICD-10-CM

## 2023-07-20 LAB — COMPREHENSIVE METABOLIC PANEL
ALT: 16 U/L (ref 0–35)
AST: 19 U/L (ref 0–37)
Albumin: 4.2 g/dL (ref 3.5–5.2)
Alkaline Phosphatase: 66 U/L (ref 39–117)
BUN: 25 mg/dL — ABNORMAL HIGH (ref 6–23)
CO2: 31 meq/L (ref 19–32)
Calcium: 9.5 mg/dL (ref 8.4–10.5)
Chloride: 101 meq/L (ref 96–112)
Creatinine, Ser: 0.75 mg/dL (ref 0.40–1.20)
GFR: 88.64 mL/min (ref >60.00)
Glucose, Bld: 96 mg/dL (ref 70–99)
Potassium: 3.3 meq/L — ABNORMAL LOW (ref 3.5–5.1)
Sodium: 140 meq/L (ref 135–145)
Total Bilirubin: 0.5 mg/dL (ref 0.2–1.2)
Total Protein: 7.1 g/dL (ref 6.0–8.3)

## 2023-07-20 LAB — CBC WITH DIFFERENTIAL/PLATELET
Basophils Absolute: 0 10*3/uL (ref 0.0–0.1)
Basophils Relative: 0.9 % (ref 0.0–3.0)
Eosinophils Absolute: 0.2 10*3/uL (ref 0.0–0.7)
Eosinophils Relative: 3.7 % (ref 0.0–5.0)
HCT: 36.9 % (ref 36.0–46.0)
Hemoglobin: 12.2 g/dL (ref 12.0–15.0)
Lymphocytes Relative: 16.4 % (ref 12.0–46.0)
Lymphs Abs: 0.9 10*3/uL (ref 0.7–4.0)
MCHC: 33 g/dL (ref 30.0–36.0)
MCV: 89.8 fL (ref 78.0–100.0)
Monocytes Absolute: 0.5 10*3/uL (ref 0.1–1.0)
Monocytes Relative: 9.5 % (ref 3.0–12.0)
Neutro Abs: 3.7 10*3/uL (ref 1.4–7.7)
Neutrophils Relative %: 69.5 % (ref 43.0–77.0)
Platelets: 235 10*3/uL (ref 150.0–400.0)
RBC: 4.11 Mil/uL (ref 3.87–5.11)
RDW: 14.1 % (ref 11.5–15.5)
WBC: 5.4 10*3/uL (ref 4.0–10.5)

## 2023-07-20 LAB — TSH: TSH: 1.56 u[IU]/mL (ref 0.35–5.50)

## 2023-07-20 MED ORDER — POTASSIUM CHLORIDE CRYS ER 10 MEQ PO TBCR
10.0000 meq | EXTENDED_RELEASE_TABLET | Freq: Every day | ORAL | 0 refills | Status: DC
  Filled 2023-07-20: qty 14, 14d supply, fill #0

## 2023-07-20 NOTE — Patient Instructions (Signed)
Continue current medications.  Call the office if symptoms worsen or persist.

## 2023-07-20 NOTE — Progress Notes (Signed)
Acute Office Visit  Subjective:     Patient ID: Sydney [name], female    DOB: [birth year], 57 y.o.   MRN: [id number]  No chief complaint on file.   HPI Patient is in today with concerns of left-sided facial numbness down her left arm that began this morning and has slowly gotten better.  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  Patient does admit to increased stress this morning.  She has a sick cat that requires insulin administration and had some difficulty with his health prior to coming to work today.  She denies any pain.  Denies any past medical history of any neck injuries.  Denies any chest pain, shortness of breath, or palpitations.  She recently had her blood pressure medication changed to carvedilol 3.125 mg tablets that she started on yesterday.  As far she can tell she has been tolerating that well.  She does have a family history of cardiovascular disease in her father who had bypass surgery in his late 70s.  Review of Systems  Respiratory:  Negative for shortness of breath.   Cardiovascular:  Negative for chest pain and palpitations.  Gastrointestinal:  Negative for nausea and vomiting.  Musculoskeletal: Negative.  Negative for back pain and neck pain.  Neurological:  Negative for dizziness, weakness and headaches.       Facial numbness, left arm numbness-improved  Psychiatric/Behavioral:  Negative for depression. The patient is nervous/anxious.        Admits to increased stress  All other systems reviewed and are negative. Past Medical History:  Diagnosis Date  . Allergy   . Anemia   . Eczema   . GERD (gastroesophageal reflux disease)   . Hematuria   . Hyperlipidemia   . Hypertension   . Kidney stone on left side   . Lump in female breast    seen by Dr. Birdie Sons  . Metabolic syndrome   . Microscopic hematuria   . Vitreous  detachment of right eye 08/22/2019   Dr. Druscilla Brownie,  [facility]    Social History   Socioeconomic History  . Marital status: Single    Spouse name: Not on file  . Number of children: 0  . Years of education: Not on file  . 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 Smoking status: Never  . Smokeless tobacco: Never  Vaping Use  . Vaping status: Never Used  Substance and Sexual Activity  . Alcohol use: No    Alcohol/week: 0.0 standard drinks of alcohol  . Drug use: No  . Sexual activity: Not Currently  Other Topics Concern  . Not on file  Social History Narrative   Works for [employer], lives alone   She has one cat   Mother is in memory care now    Social Determinants of Health   Financial Resource Strain: Low Risk  (08/28/2021)   Overall Financial Resource Strain (CARDIA)   . Difficulty of Paying Living Expenses: Not hard at all  Food Insecurity: No Food Insecurity (08/28/2021)   Hunger Vital Sign   . Worried About Programme researcher, broadcasting/film/video in the Last Year: Never true   . Ran Out of Food in the Last Year: Never true  Transportation  Needs: No Transportation Needs (08/28/2021)   PRAPARE - Transportation   . Lack of Transportation (Medical): No   . Lack of Transportation (Non-Medical): No  Physical Activity: Inactive (08/28/2021)   Exercise Vital Sign   . Days of Exercise per Week: 0 days   . Minutes of Exercise per Session: 0 min  Stress: No Stress Concern Present (08/28/2021)   [employer] of Occupational Health - Occupational Stress Questionnaire   . Feeling of Stress : Only a little  Social Connections: Moderately Integrated (08/28/2021)   Social Connection and Isolation Panel [NHANES]   . Frequency of Communication with Friends and Family: Three times a week   . Frequency of Social Gatherings with Friends and Family: Once a week   .  Attends Religious Services: More than 4 times per year   . Active Member of Clubs or Organizations: Yes   . Attends Banker Meetings: 1 to 4 times per year   . Marital Status: Never married  Intimate Partner Violence: Not At Risk (08/28/2021)   Humiliation, Afraid, Rape, and Kick questionnaire   . Fear of Current or Ex-Partner: No   . Emotionally Abused: No   . Physically Abused: No   . Sexually Abused: No    Past Surgical History:  Procedure Laterality Date  . oral surgery  Left 10/2020   molar tooth implant   . URETHRAL DILATION  Age 89    Family History  Problem Relation Age of Onset  . Hypertension Mother   . Hyperlipidemia Mother   . Dementia Mother   . Diabetes Father   . CAD Father   . Psoriasis Father   . Heart disease Father   . Cancer Father        Prostate and Multiple Myeloma  . Asthma Sister   . Thyroid disease Sister   . Diabetes Sister   . Breast cancer Neg Hx     Allergies  Allergen Reactions  . Amoxicillin   . Sulfa Antibiotics     Current Outpatient Medications on File Prior to Visit  Medication Sig Dispense Refill  . Calcium-Phosphorus-Vitamin D (CITRACAL +D3 PO) Take 2 tablets by mouth daily.    . carvedilol (COREG) 3.125 MG tablet Take 1 tablet (3.125 mg total) by mouth 2 (two) times daily with a meal. 60 tablet 3  . Cholecalciferol (D3-1000 PO) Take by mouth daily.    . Clindamycin-Benzoyl Per, Refr, gel Apply to affected area 2 (two) times daily. 45 g 2  . hydrochlorothiazide (MICROZIDE) 12.5 MG capsule Take 1 capsule (12.5 mg total) by mouth daily. 90 capsule 1  . hydrocortisone 2.5 % cream Apply 1 Application to afffectead area under breast topically 2 (two) times daily until clear. 454 g 2  . ibuprofen (ADVIL) 200 MG tablet Take 200 mg by mouth as needed.    . Lutein 20 MG TABS Take 20 mg by mouth daily.    . Lutein-Zeaxanthin-Bilberry 20-4-40 MG CHEW Chew by mouth daily.    . Misc Natural Products (AIRBORNE ELDERBERRY) CHEW  Chew 2 each by mouth daily. Nature's Bounty Elderberry Gummies    . Misc Natural Products (LUTEIN 20 PO) Take 1 each by mouth daily.    . Multiple Vitamins-Minerals (CENTRUM ADULTS PO) Take by mouth.    . mupirocin ointment (BACTROBAN) 2 % Apply 1 Application topically 2 (two) times daily. 22 g 0  . TART CHERRY PO Take by mouth.    . Turmeric (QC TUMERIC COMPLEX PO) Take by mouth.  No current facility-administered medications on file prior to visit.    BP (!) 140/90   Pulse 97   Temp 97.8 F (36.6 C) (Oral)   Ht 5\' 3"  (1.6 m)   LMP 07/21/2015 (Approximate)   SpO2 100%   BMI 31.25 kg/m chart      Objective:    BP (!) 140/90   Pulse 97   Temp 97.8 F (36.6 C) (Oral)   Ht 5\' 3"  (1.6 m)   LMP 07/21/2015 (Approximate)   SpO2 100%   BMI 31.25 kg/m    Physical Exam Vitals reviewed.  Constitutional:      Appearance: Normal appearance. She is normal weight.  Eyes:     Pupils: Pupils are equal, round, and reactive to light.  Cardiovascular:     Rate and Rhythm: Tachycardia present. Rhythm irregular.     Pulses: Normal pulses.     Heart sounds: Normal heart sounds.  Pulmonary:     Effort: Pulmonary effort is normal.     Breath sounds: Normal breath sounds.  Abdominal:     General: Abdomen is flat. Bowel sounds are normal.     Palpations: Abdomen is soft.     Tenderness: There is no abdominal tenderness. There is no guarding.  Musculoskeletal:        General: No tenderness. Normal range of motion.     Cervical back: Normal range of motion and neck supple. No tenderness.     Right lower leg: No edema.     Left lower leg: No edema.  Skin:    General: Skin is warm and dry.  Neurological:     General: No focal deficit present.     Mental Status: She is alert and oriented to person, place, and time. Mental status is at baseline.     Cranial Nerves: Cranial nerves 2-12 are intact. No cranial nerve deficit or facial asymmetry.     Motor: No weakness.     Gait: Gait  normal.     Deep Tendon Reflexes: Reflexes normal.  Psychiatric:        Mood and Affect: Mood normal.        Behavior: Behavior normal.   No results found for any visits on 07/20/23.      Assessment & Plan:   Problem List Items Addressed This Visit     Benign hypertension   Relevant Orders   CBC w/Diff   CMP   TSH   Other Visit Diagnoses     Facial numbness    -  Primary   Relevant Orders   CBC w/Diff   CMP   TSH   Stress reaction       Relevant Orders   CBC w/Diff   CMP   TSH      Discussed symptoms today with the patient.  Symptoms appear to be stress induced.  Will obtain labs to ensure that there are no electrolyte imbalances or thyroid concerns given her family history.  Call the office if symptoms worsen or persist.  Recheck as scheduled, pending labs, and sooner as needed. No orders of the defined types were placed in this encounter.   No follow-ups on file.  Eulis Foster, FNP

## 2023-07-20 NOTE — Progress Notes (Signed)
Pt c/o numbness on left side (from face to finger tips.) Started about 7 AM and lasted for about an hour. Pt approached to ask if she should take an extra BP pill. Advised not to take any extra medication. Pt also stated that she had a very stressful morning this morning before work and this is when she noticed symptom onset. Pt appears well and in no acute distress. Equal strength on both sides. No SOB, no chest pain, etc. VSS. 134/92, 97% RA, pulse 84. Pt refused to go to ED. EKG completed. Added to Padonda's schedule this PM for pt to be evaluated.

## 2023-07-21 ENCOUNTER — Telehealth: Payer: Self-pay | Admitting: Internal Medicine

## 2023-07-21 ENCOUNTER — Other Ambulatory Visit: Payer: Self-pay

## 2023-07-21 DIAGNOSIS — E876 Hypokalemia: Secondary | ICD-10-CM

## 2023-07-21 NOTE — Telephone Encounter (Signed)
Patient need lab orders.

## 2023-07-21 NOTE — Telephone Encounter (Signed)
Order placed for f/u potassium check.  

## 2023-07-27 ENCOUNTER — Other Ambulatory Visit (INDEPENDENT_AMBULATORY_CARE_PROVIDER_SITE_OTHER): Payer: Commercial Managed Care - PPO

## 2023-07-27 DIAGNOSIS — E876 Hypokalemia: Secondary | ICD-10-CM

## 2023-07-28 ENCOUNTER — Ambulatory Visit: Payer: Commercial Managed Care - PPO | Admitting: Internal Medicine

## 2023-07-28 VITALS — HR 82 | Temp 97.8°F | Resp 16 | Ht 63.0 in | Wt 176.0 lb

## 2023-07-28 DIAGNOSIS — I10 Essential (primary) hypertension: Secondary | ICD-10-CM

## 2023-07-28 LAB — POTASSIUM: Potassium: 3.7 meq/L (ref 3.5–5.1)

## 2023-07-28 NOTE — Progress Notes (Unsigned)
Subjective:    Patient ID: Sydney [name], female    DOB: 08-11-1966, 57 y.o.   MRN: [id number]  Patient here for No chief complaint on file.   HPI Here for a scheduled follow up. Recently evaluated as a work in appt by Worthy Rancher. Evaluated with concerns of left-sided facial numbness down her left arm that began this morning and has slowly gotten better. 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    Past Medical History:  Diagnosis Date   Allergy    Anemia    Eczema    GERD (gastroesophageal reflux disease)    Hematuria    Hyperlipidemia    Hypertension    Kidney stone on left side    Lump in female breast    seen by Dr. Birdie Sons   Metabolic syndrome    Microscopic hematuria    Vitreous detachment of right eye 08/22/2019   Dr. Druscilla Brownie,  [facility]   Past Surgical History:  Procedure Laterality Date   oral surgery  Left 10/2020   molar tooth implant    URETHRAL DILATION  Age 89   Family History  Problem Relation Age of Onset   Hypertension Mother    Hyperlipidemia Mother    Dementia Mother    Diabetes Father    CAD Father    Psoriasis Father    Heart disease Father    Cancer Father        Prostate and Multiple Myeloma   Asthma Sister    Thyroid disease Sister    Diabetes Sister    Breast cancer Neg Hx    Social History   Socioeconomic History   Marital status: Single    Spouse name: Not on file   Number of children: 0   Years of education: Not on file   Highest education level: Bachelor's degree (e.g., BA, AB, BS)  Occupational History    Employer: [employer]    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  Tobacco Use   Smoking status: Never   Smokeless tobacco: Never  Vaping Use   Vaping status: Never Used  Substance and Sexual Activity   Alcohol use: No    Alcohol/week: 0.0 standard drinks of alcohol    Drug use: No   Sexual activity: Not Currently  Other Topics Concern   Not on file  Social History Narrative   Works for [employer], lives alone   She has one cat   Mother is in memory care now    Social Determinants of Health   Financial Resource Strain: Low Risk  (08/28/2021)   Overall Financial Resource Strain (CARDIA)    Difficulty of Paying Living Expenses: Not hard at all  Food Insecurity: No Food Insecurity (08/28/2021)   Hunger Vital Sign    Worried About Running Out of Food in the Last Year: Never true    Ran Out of Food in the Last Year: Never true  Transportation Needs: No Transportation Needs (08/28/2021)   PRAPARE - Administrator, Civil Service (Medical): No    Lack of Transportation (Non-Medical): No  Physical Activity: Inactive (08/28/2021)   Exercise Vital Sign    Days of Exercise per Week: 0 days    Minutes of Exercise per Session: 0 min  Stress: No Stress Concern Present (08/28/2021)   [employer] of Occupational Health - Occupational Stress Questionnaire  Feeling of Stress : Only a little  Social Connections: Moderately Integrated (08/28/2021)   Social Connection and Isolation Panel [NHANES]    Frequency of Communication with Friends and Family: Three times a week    Frequency of Social Gatherings with Friends and Family: Once a week    Attends Religious Services: More than 4 times per year    Active Member of [employer] or Organizations: Yes    Attends Banker Meetings: 1 to 4 times per year    Marital Status: Never married     Review of Systems     Objective:     LMP 07/21/2015 (Approximate)  Wt Readings from Last 3 Encounters:  07/13/23 176 lb 6.4 oz (80 kg)  06/30/23 178 lb (80.7 kg)  05/19/23 176 lb (79.8 kg)    Physical Exam   Outpatient Encounter Medications as of 07/28/2023  Medication Sig   Calcium-Phosphorus-Vitamin D (CITRACAL +D3 PO) Take 2 tablets by mouth daily.   carvedilol (COREG) 3.125 MG tablet Take 1  tablet (3.125 mg total) by mouth 2 (two) times daily with a meal.   Cholecalciferol (D3-1000 PO) Take by mouth daily.   Clindamycin-Benzoyl Per, Refr, gel Apply to affected area 2 (two) times daily.   hydrochlorothiazide (MICROZIDE) 12.5 MG capsule Take 1 capsule (12.5 mg total) by mouth daily.   hydrocortisone 2.5 % cream Apply 1 Application to afffectead area under breast topically 2 (two) times daily until clear.   ibuprofen (ADVIL) 200 MG tablet Take 200 mg by mouth as needed.   Lutein 20 MG TABS Take 20 mg by mouth daily.   Lutein-Zeaxanthin-Bilberry 20-4-40 MG CHEW Chew by mouth daily.   Misc Natural Products (AIRBORNE ELDERBERRY) CHEW Chew 2 each by mouth daily. Nature's Bounty Elderberry Gummies   Misc Natural Products (LUTEIN 20 PO) Take 1 each by mouth daily.   Multiple Vitamins-Minerals (CENTRUM ADULTS PO) Take by mouth.   mupirocin ointment (BACTROBAN) 2 % Apply 1 Application topically 2 (two) times daily.   potassium chloride (KLOR-CON M) 10 MEQ tablet Take 1 tablet (10 mEq total) by mouth daily.   TART CHERRY PO Take by mouth.   Turmeric (QC TUMERIC COMPLEX PO) Take by mouth.   No facility-administered encounter medications on file as of 07/28/2023.     Lab Results  Component Value Date   WBC 5.4 07/20/2023   HGB 12.2 07/20/2023   HCT 36.9 07/20/2023   PLT 235.0 07/20/2023   GLUCOSE 96 07/20/2023   CHOL 215 (H) 03/19/2023   TRIG 78 03/19/2023   HDL 56 03/19/2023   LDLCALC 142 (H) 03/19/2023   ALT 16 07/20/2023   AST 19 07/20/2023   NA 140 07/20/2023   K 3.3 (L) 07/20/2023   CL 101 07/20/2023   CREATININE 0.75 07/20/2023   BUN 25 (H) 07/20/2023   CO2 31 07/20/2023   TSH 1.56 07/20/2023   HGBA1C 5.9 (H) 03/19/2023    DG Bone Density  Result Date: 01/14/2023 EXAM: DUAL X-RAY ABSORPTIOMETRY (DXA) FOR BONE MINERAL DENSITY IMPRESSION: Your patient Sydney [name] completed a BMD test on 01/14/2023 using the [employer] iDXA DXA System (software version: 14.10)  manufactured by [employer]. The following summarizes the results of our evaluation. Technologist: SCE PATIENT BIOGRAPHICAL: Name: Sydney, [name] Patient ID: [id number] Birth Date: [birth year] Height: 62.5 in. Gender: Female Exam Date: 01/14/2023 Weight: 176.7 lbs. Indications: Caucasian, History of Fracture (Adult), Osteoarthritis, Postmenopausal Fractures: Hand Left Treatments: calcium w/ vit D, meloxicam, Vitamin D DENSITOMETRY RESULTS:  Site      Region        Measured Date Measured Age WHO Classification Young Adult T-score BMD         %Change vs. Previous Significant Change (*) AP Spine L1-L4 (L2,L3) 01/14/2023 56.4 Normal 1.8 1.402 g/cm2 - - DualFemur Neck Right 01/14/2023 56.4 Normal -0.5 0.973 g/cm2 - - ASSESSMENT: The BMD measured at Femur Neck Right is 0.973 g/cm2 with a T-score of -0.5. This patient is considered normal according to World Health Organization [facility]) criteria. The scan quality is good. L2 and L3 was excluded due to degenerative changes. World Science writer [facility]) criteria for post-menopausal, Caucasian Women: Normal:                   T-score at or above -1 SD Osteopenia/low bone mass: T-score between -1 and -2.5 SD Osteoporosis:             T-score at or below -2.5 SD RECOMMENDATIONS: 1. All patients should optimize calcium and vitamin D intake. 2. Consider FDA-approved medical therapies in postmenopausal women and men aged 54 years and older, based on the following: a. A hip or vertebral(clinical or morphometric) fracture b. T-score < -2.5 at the femoral neck or spine after appropriate evaluation to exclude secondary causes c. Low bone mass (T-score between -1.0 and -2.5 at the femoral neck or spine) and a 10-year probability of a hip fracture > 3% or a 10-year probability of a major osteoporosis-related fracture > 20% based on the [place]-adapted WHO algorithm 3. Clinician judgment and/or patient preferences may indicate treatment for people with 10-year fracture  probabilities above or below these levels FOLLOW-UP: People with diagnosed cases of osteoporosis or at high risk for fracture should have regular bone mineral density tests. For patients eligible for Medicare, routine testing is allowed once every 2 years. The testing frequency can be increased to one year for patients who have rapidly progressing disease, those who are receiving or discontinuing medical therapy to restore bone mass, or have additional risk factors. I have reviewed this report, and agree with the above findings. [facility] Electronically Signed   By: Bary Richard M.D.   On: 01/14/2023 09:04       Assessment & Plan:  There are no diagnoses linked to this encounter.   [place], MD

## 2023-07-29 ENCOUNTER — Encounter: Payer: Self-pay | Admitting: Internal Medicine

## 2023-07-29 NOTE — Progress Notes (Signed)
Patient ID: Sydney [name], female   DOB: 09-17-1965, 57 y.o.   MRN: [id number] Rescheduled appt.

## 2023-08-03 ENCOUNTER — Ambulatory Visit: Payer: Commercial Managed Care - PPO

## 2023-08-04 ENCOUNTER — Other Ambulatory Visit: Payer: Self-pay

## 2023-08-04 MED ORDER — POTASSIUM CHLORIDE CRYS ER 10 MEQ PO TBCR
10.0000 meq | EXTENDED_RELEASE_TABLET | Freq: Every day | ORAL | 0 refills | Status: DC
  Filled 2023-08-04 (×2): qty 30, 30d supply, fill #0

## 2023-08-09 ENCOUNTER — Other Ambulatory Visit (HOSPITAL_BASED_OUTPATIENT_CLINIC_OR_DEPARTMENT_OTHER): Payer: Self-pay

## 2023-08-15 ENCOUNTER — Other Ambulatory Visit: Payer: Self-pay

## 2023-08-17 ENCOUNTER — Other Ambulatory Visit (INDEPENDENT_AMBULATORY_CARE_PROVIDER_SITE_OTHER): Payer: Commercial Managed Care - PPO

## 2023-08-17 ENCOUNTER — Other Ambulatory Visit: Payer: Self-pay

## 2023-08-17 DIAGNOSIS — E876 Hypokalemia: Secondary | ICD-10-CM

## 2023-08-17 LAB — POTASSIUM: Potassium: 3.6 meq/L (ref 3.5–5.1)

## 2023-08-30 ENCOUNTER — Other Ambulatory Visit: Payer: Self-pay | Admitting: Internal Medicine

## 2023-08-31 ENCOUNTER — Other Ambulatory Visit: Payer: Self-pay | Admitting: Internal Medicine

## 2023-08-31 ENCOUNTER — Other Ambulatory Visit: Payer: Self-pay

## 2023-09-01 MED FILL — Potassium Chloride Microencapsulated Crys ER Tab 10 mEq: ORAL | 30 days supply | Qty: 30 | Fill #0 | Status: AC

## 2023-09-02 ENCOUNTER — Other Ambulatory Visit: Payer: Self-pay

## 2023-09-05 ENCOUNTER — Other Ambulatory Visit: Payer: Self-pay

## 2023-09-13 ENCOUNTER — Ambulatory Visit: Payer: Commercial Managed Care - PPO | Admitting: Family Medicine

## 2023-09-29 ENCOUNTER — Other Ambulatory Visit: Payer: Self-pay | Admitting: Internal Medicine

## 2023-09-30 ENCOUNTER — Other Ambulatory Visit: Payer: Self-pay

## 2023-09-30 ENCOUNTER — Other Ambulatory Visit: Payer: Self-pay | Admitting: Internal Medicine

## 2023-10-04 ENCOUNTER — Other Ambulatory Visit: Payer: Self-pay

## 2023-10-04 MED FILL — Potassium Chloride Microencapsulated Crys ER Tab 10 mEq: ORAL | 30 days supply | Qty: 30 | Fill #0 | Status: AC

## 2023-10-07 ENCOUNTER — Other Ambulatory Visit: Payer: Self-pay

## 2023-10-25 NOTE — Progress Notes (Deleted)
 [name] [address] [place] [address] Phone: [phone] Subjective:    I'm seeing this patient by the request  of:  Dale Bonsall, MD  CC:   [id number]  06/30/2023 Seems to be improving at this time.  We could do a potential injection if necessary.  Patient will continue to stay active otherwise.  Discussed the possibility of steroid injections when needed.  Patient wants to avoid any surgical intervention.  PRP seems to have help but does have some very mild hypoechoic changes so did have another injection.      Update 11/03/2023 ABI SHOULTS is a 58 y.o. female coming in with complaint of L knee pain. Patient states       Past Medical History:  Diagnosis Date   Allergy    Anemia    Eczema    GERD (gastroesophageal reflux disease)    Hematuria    Hyperlipidemia    Hypertension    Kidney stone on left side    Lump in female breast    seen by Dr. Birdie Sons   Metabolic syndrome    Microscopic hematuria    Vitreous detachment of right eye 08/22/2019   Dr. 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  Tobacco Use   Smoking status: Never   Smokeless tobacco: Never  Vaping Use   Vaping status: Never Used  Substance and Sexual Activity   Alcohol use: No    Alcohol/week: 0.0 standard drinks of alcohol   Drug use: No   Sexual activity: Not Currently  Other Topics Concern   Not on file  Social History Narrative   Works for [employer], lives alone   She has one cat   Mother is in memory care now     Social Drivers of Health   Financial Resource Strain: Low Risk  (08/28/2021)   Overall Financial Resource Strain (CARDIA)    Difficulty of Paying Living Expenses: Not hard at all  Food Insecurity: No Food Insecurity (08/28/2021)   Hunger Vital Sign    Worried About Running Out of Food in the Last Year: Never true    Ran Out of Food in the Last Year: Never true  Transportation Needs: No Transportation Needs (08/28/2021)   PRAPARE - Administrator, Civil Service (Medical): No    Lack of Transportation (Non-Medical): No  Physical Activity: Inactive (08/28/2021)   Exercise Vital Sign    Days of Exercise per Week: 0 days    Minutes of Exercise per Session: 0 min  Stress: No Stress Concern Present (08/28/2021)   [employer] of Occupational Health - Occupational Stress Questionnaire    Feeling of Stress : Only a little  Social Connections: Moderately Integrated (08/28/2021)   Social Connection and Isolation Panel [NHANES]    Frequency of Communication with Friends and Family: Three times a week    Frequency of Social Gatherings with Friends and Family: Once a week    Attends Religious  Services: More than 4 times per year    Active Member of Clubs or Organizations: Yes    Attends Banker Meetings: 1 to 4 times per year    Marital Status: Never married   Allergies  Allergen Reactions   Amoxicillin    Sulfa Antibiotics    Family History  Problem Relation Age of Onset   Hypertension Mother    Hyperlipidemia Mother    Dementia Mother    Diabetes Father    CAD Father    Psoriasis Father    Heart disease Father    Cancer Father        Prostate and Multiple Myeloma   Asthma Sister    Thyroid disease Sister    Diabetes Sister    Breast cancer Neg Hx      Current Outpatient Medications (Cardiovascular):    carvedilol (COREG) 3.125 MG tablet, Take 1 tablet (3.125 mg total) by mouth 2 (two) times daily with a meal.   hydrochlorothiazide (MICROZIDE)  12.5 MG capsule, Take 1 capsule (12.5 mg total) by mouth daily.   Current Outpatient Medications (Analgesics):    ibuprofen (ADVIL) 200 MG tablet, Take 200 mg by mouth as needed.   Current Outpatient Medications (Other):    Calcium-Phosphorus-Vitamin D (CITRACAL +D3 PO), Take 2 tablets by mouth daily.   Cholecalciferol (D3-1000 PO), Take by mouth daily.   Clindamycin-Benzoyl Per, Refr, gel, Apply to affected area 2 (two) times daily.   hydrocortisone 2.5 % cream, Apply 1 Application to afffectead area under breast topically 2 (two) times daily until clear.   Lutein 20 MG TABS, Take 20 mg by mouth daily.   Lutein-Zeaxanthin-Bilberry 20-4-40 MG CHEW, Chew by mouth daily.   Misc Natural Products (AIRBORNE ELDERBERRY) CHEW, Chew 2 each by mouth daily. Nature's Bounty Elderberry Gummies   Misc Natural Products (LUTEIN 20 PO), Take 1 each by mouth daily.   Multiple Vitamins-Minerals (CENTRUM ADULTS PO), Take by mouth.   mupirocin ointment (BACTROBAN) 2 %, Apply 1 Application topically 2 (two) times daily.   potassium chloride (KLOR-CON M) 10 MEQ tablet, Take 1 tablet (10 mEq total) by mouth daily.   TART CHERRY PO, Take by mouth.   Turmeric (QC TUMERIC COMPLEX PO), Take by mouth.   Reviewed prior external information including notes and imaging from  primary care provider As well as notes that were available from care everywhere and other healthcare systems.  Past medical history, social, surgical and family history all reviewed in electronic medical record.  No pertanent information unless stated regarding to the chief complaint.   Review of Systems:  No headache, visual changes, nausea, vomiting, diarrhea, constipation, dizziness, abdominal pain, skin rash, fevers, chills, night sweats, weight loss, swollen lymph nodes, body aches, joint swelling, chest pain, shortness of breath, mood changes. POSITIVE muscle aches  Objective  Last menstrual period 07/21/2015.   General: No apparent  distress alert and oriented x3 mood and affect normal, dressed appropriately.  HEENT: Pupils equal, extraocular movements intact  Respiratory: Patient's speak in full sentences and does not appear short of breath  Cardiovascular: No lower extremity edema, non tender, no erythema      Impression and Recommendations:

## 2023-10-28 ENCOUNTER — Other Ambulatory Visit: Payer: Self-pay | Admitting: Internal Medicine

## 2023-10-29 ENCOUNTER — Other Ambulatory Visit: Payer: Self-pay

## 2023-10-29 ENCOUNTER — Other Ambulatory Visit: Payer: Self-pay | Admitting: Internal Medicine

## 2023-10-29 MED FILL — Potassium Chloride Microencapsulated Crys ER Tab 10 mEq: ORAL | 30 days supply | Qty: 30 | Fill #0 | Status: AC

## 2023-11-03 ENCOUNTER — Ambulatory Visit: Payer: Commercial Managed Care - PPO | Admitting: Family Medicine

## 2023-11-09 ENCOUNTER — Ambulatory Visit: Payer: Commercial Managed Care - PPO | Admitting: Dermatology

## 2023-11-10 ENCOUNTER — Other Ambulatory Visit: Payer: Self-pay

## 2023-11-10 ENCOUNTER — Other Ambulatory Visit: Payer: Self-pay | Admitting: Internal Medicine

## 2023-11-10 MED ORDER — CARVEDILOL 3.125 MG PO TABS
3.1250 mg | ORAL_TABLET | Freq: Two times a day (BID) | ORAL | 3 refills | Status: DC
  Filled 2023-11-10: qty 60, 30d supply, fill #0
  Filled 2023-12-11: qty 60, 30d supply, fill #1

## 2023-12-01 ENCOUNTER — Other Ambulatory Visit: Payer: Self-pay | Admitting: Internal Medicine

## 2023-12-01 ENCOUNTER — Other Ambulatory Visit: Payer: Self-pay

## 2023-12-02 ENCOUNTER — Other Ambulatory Visit: Payer: Self-pay

## 2023-12-02 MED ORDER — POTASSIUM CHLORIDE CRYS ER 10 MEQ PO TBCR
10.0000 meq | EXTENDED_RELEASE_TABLET | Freq: Every day | ORAL | 0 refills | Status: DC
  Filled 2023-12-02: qty 30, 30d supply, fill #0

## 2023-12-13 ENCOUNTER — Encounter: Payer: Commercial Managed Care - PPO | Admitting: Internal Medicine

## 2023-12-28 ENCOUNTER — Ambulatory Visit (INDEPENDENT_AMBULATORY_CARE_PROVIDER_SITE_OTHER): Admitting: Internal Medicine

## 2023-12-28 ENCOUNTER — Encounter: Payer: Self-pay | Admitting: Internal Medicine

## 2023-12-28 ENCOUNTER — Other Ambulatory Visit: Payer: Self-pay

## 2023-12-28 VITALS — BP 134/78 | HR 83 | Ht 63.0 in | Wt 177.0 lb

## 2023-12-28 DIAGNOSIS — R739 Hyperglycemia, unspecified: Secondary | ICD-10-CM | POA: Diagnosis not present

## 2023-12-28 DIAGNOSIS — R252 Cramp and spasm: Secondary | ICD-10-CM

## 2023-12-28 DIAGNOSIS — Z1231 Encounter for screening mammogram for malignant neoplasm of breast: Secondary | ICD-10-CM | POA: Diagnosis not present

## 2023-12-28 DIAGNOSIS — I10 Essential (primary) hypertension: Secondary | ICD-10-CM | POA: Diagnosis not present

## 2023-12-28 DIAGNOSIS — E876 Hypokalemia: Secondary | ICD-10-CM

## 2023-12-28 DIAGNOSIS — Z0000 Encounter for general adult medical examination without abnormal findings: Secondary | ICD-10-CM

## 2023-12-28 DIAGNOSIS — E7800 Pure hypercholesterolemia, unspecified: Secondary | ICD-10-CM | POA: Diagnosis not present

## 2023-12-28 LAB — CBC WITH DIFFERENTIAL/PLATELET
Basophils Absolute: 0.1 10*3/uL (ref 0.0–0.1)
Basophils Relative: 1.1 % (ref 0.0–3.0)
Eosinophils Absolute: 0.2 10*3/uL (ref 0.0–0.7)
Eosinophils Relative: 4.8 % (ref 0.0–5.0)
HCT: 36.6 % (ref 36.0–46.0)
Hemoglobin: 12.2 g/dL (ref 12.0–15.0)
Lymphocytes Relative: 18.1 % (ref 12.0–46.0)
Lymphs Abs: 0.8 10*3/uL (ref 0.7–4.0)
MCHC: 33.2 g/dL (ref 30.0–36.0)
MCV: 89.5 fl (ref 78.0–100.0)
Monocytes Absolute: 0.5 10*3/uL (ref 0.1–1.0)
Monocytes Relative: 10 % (ref 3.0–12.0)
Neutro Abs: 3.1 10*3/uL (ref 1.4–7.7)
Neutrophils Relative %: 66 % (ref 43.0–77.0)
Platelets: 222 10*3/uL (ref 150.0–400.0)
RBC: 4.09 Mil/uL (ref 3.87–5.11)
RDW: 13.6 % (ref 11.5–15.5)
WBC: 4.6 10*3/uL (ref 4.0–10.5)

## 2023-12-28 LAB — HEPATIC FUNCTION PANEL
ALT: 15 U/L (ref 0–35)
AST: 18 U/L (ref 0–37)
Albumin: 4.2 g/dL (ref 3.5–5.2)
Alkaline Phosphatase: 64 U/L (ref 39–117)
Bilirubin, Direct: 0.1 mg/dL (ref 0.0–0.3)
Total Bilirubin: 0.5 mg/dL (ref 0.2–1.2)
Total Protein: 7.2 g/dL (ref 6.0–8.3)

## 2023-12-28 LAB — BASIC METABOLIC PANEL WITH GFR
BUN: 20 mg/dL (ref 6–23)
CO2: 31 meq/L (ref 19–32)
Calcium: 9.2 mg/dL (ref 8.4–10.5)
Chloride: 101 meq/L (ref 96–112)
Creatinine, Ser: 0.63 mg/dL (ref 0.40–1.20)
GFR: 98.46 mL/min (ref >60.00)
Glucose, Bld: 86 mg/dL (ref 70–99)
Potassium: 3.5 meq/L (ref 3.5–5.1)
Sodium: 138 meq/L (ref 135–145)

## 2023-12-28 LAB — HEMOGLOBIN A1C: Hgb A1c MFr Bld: 6 % (ref 4.6–6.5)

## 2023-12-28 LAB — LIPID PANEL
Cholesterol: 242 mg/dL — ABNORMAL HIGH (ref 0–200)
HDL: 57 mg/dL (ref >39.00)
LDL Cholesterol: 166 mg/dL — ABNORMAL HIGH (ref 0–99)
NonHDL: 184.82
Total CHOL/HDL Ratio: 4
Triglycerides: 93 mg/dL (ref 0.0–149.0)
VLDL: 18.6 mg/dL (ref 0.0–40.0)

## 2023-12-28 MED ORDER — CARVEDILOL 3.125 MG PO TABS
3.1250 mg | ORAL_TABLET | Freq: Two times a day (BID) | ORAL | 1 refills | Status: DC
  Filled 2023-12-28 – 2024-01-09 (×2): qty 180, 90d supply, fill #0
  Filled 2024-04-02: qty 180, 90d supply, fill #1

## 2023-12-28 MED ORDER — HYDROCHLOROTHIAZIDE 12.5 MG PO CAPS
12.5000 mg | ORAL_CAPSULE | Freq: Every day | ORAL | 1 refills | Status: DC
  Filled 2023-12-28 – 2024-02-13 (×2): qty 90, 90d supply, fill #0
  Filled 2024-05-17: qty 90, 90d supply, fill #1

## 2023-12-28 NOTE — Assessment & Plan Note (Addendum)
 Physical today 12/28/23. Mammogram 01/2022 - Birads I. Overdue. Order placed for mammogram. PAP 08/10/22 - negative with negative HPV. Needs colonoscopy. Discussed cologuard. Notify me when agreeable.

## 2023-12-28 NOTE — Progress Notes (Signed)
 Subjective:    Patient ID: Sydney [name], female    DOB: Nov 22, 1965, 58 y.o.   MRN: [id number]  Patient here for  Chief Complaint  Patient presents with   Annual Exam    HPI Here for a physical exam. Last visit, metoprolol  was stopped and she was started on carvedilol . Continues on hydrochlorothiazide . Has previously seen Dr Felipe Horton for her knee. Plans to start exercising - with plans to gradually increasing her exercise. No chest pain or sob reported. No abdominal pain or bowel change. Seeing chiropractor. Doing better. Discussed mammogram. Discussed colonoscopy. Handling stress. Discussed.  Overall appears to be doing well.    Past Medical History:  Diagnosis Date   Allergy    Anemia    Eczema    GERD (gastroesophageal reflux disease)    Hematuria    Hyperlipidemia    Hypertension    Kidney stone on left side    Lump in female breast    seen by Dr. Marthenia Slater   Metabolic syndrome    Microscopic hematuria    Vitreous detachment of right eye 08/22/2019   Dr. Merrell Abate,  [facility]   Past Surgical History:  Procedure Laterality Date   oral surgery  Left 10/2020   molar tooth implant    URETHRAL DILATION  Age 13   Family History  Problem Relation Age of Onset   Hypertension Mother    Hyperlipidemia Mother    Dementia Mother    Diabetes Father    CAD Father    Psoriasis Father    Heart disease Father    Cancer Father        Prostate and Multiple Myeloma   Asthma Sister    Thyroid  disease Sister    Diabetes Sister    Breast cancer Neg Hx    Social History   Socioeconomic History   Marital status: Single    Spouse name: Not on file   Number of children: 0   Years of education: Not on file   Highest education level: Bachelor's degree (e.g., BA, AB, BS)  Occupational History    Employer: [employer]    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  Tobacco Use   Smoking status: Never   Smokeless tobacco: Never  Vaping Use   Vaping status: Never Used   Substance and Sexual Activity   Alcohol use: No    Alcohol/week: 0.0 standard drinks of alcohol   Drug use: No   Sexual activity: Not Currently  Other Topics Concern   Not on file  Social History Narrative   Works for [employer], lives alone   She has one cat   Mother is in memory care now    Social Drivers of Health   Financial Resource Strain: Low Risk  (08/28/2021)   Overall Financial Resource Strain (CARDIA)    Difficulty of Paying Living Expenses: Not hard at all  Food Insecurity: No Food Insecurity (08/28/2021)   Hunger Vital Sign    Worried About Running Out of Food in the Last Year: Never true    Ran Out of Food in the Last Year: Never true  Transportation Needs: No Transportation Needs (08/28/2021)   PRAPARE - Administrator, Civil Service (Medical): No    Lack of Transportation (Non-Medical): No  Physical Activity: Inactive (08/28/2021)   Exercise Vital Sign    Days of Exercise per Week: 0 days    Minutes of Exercise per Session: 0 min  Stress: No Stress Concern Present (08/28/2021)  [employer] of Occupational Health - Occupational Stress Questionnaire    Feeling of Stress : Only a little  Social Connections: Moderately Integrated (08/28/2021)   Social Connection and Isolation Panel [NHANES]    Frequency of Communication with Friends and Family: Three times a week    Frequency of Social Gatherings with Friends and Family: Once a week    Attends Religious Services: More than 4 times per year    Active Member of [employer] or Organizations: Yes    Attends Banker Meetings: 1 to 4 times per year    Marital Status: Never married     Review of Systems  Constitutional:  Negative for appetite change and unexpected weight change.  HENT:  Negative for congestion, sinus pressure and sore throat.   Eyes:  Negative for pain and visual disturbance.  Respiratory:  Negative for cough, chest tightness and shortness of breath.   Cardiovascular:   Negative for chest pain, palpitations and leg swelling.  Gastrointestinal:  Negative for abdominal pain, diarrhea, nausea and vomiting.  Genitourinary:  Negative for difficulty urinating and dysuria.  Musculoskeletal:  Negative for joint swelling and myalgias.  Skin:  Negative for color change and rash.  Neurological:  Negative for dizziness and headaches.  Hematological:  Negative for adenopathy. Does not bruise/bleed easily.  Psychiatric/Behavioral:  Negative for agitation and dysphoric mood.        Objective:     BP 134/78   Pulse 83   Ht 5\' 3"  (1.6 m)   Wt 177 lb (80.3 kg)   LMP 07/21/2015 (Approximate)   SpO2 99%   BMI 31.35 kg/m  Wt Readings from Last 3 Encounters:  12/28/23 177 lb (80.3 kg)  07/28/23 176 lb (79.8 kg)  07/13/23 176 lb 6.4 oz (80 kg)    Physical Exam Vitals reviewed.  Constitutional:      General: She is not in acute distress.    Appearance: Normal appearance. She is well-developed.  HENT:     Head: Normocephalic and atraumatic.     Right Ear: External ear normal.     Left Ear: External ear normal.     Mouth/Throat:     Pharynx: No oropharyngeal exudate or posterior oropharyngeal erythema.  Eyes:     General: No scleral icterus.       Right eye: No discharge.        Left eye: No discharge.     Conjunctiva/sclera: Conjunctivae normal.  Neck:     Thyroid : No thyromegaly.  Cardiovascular:     Rate and Rhythm: Normal rate and regular rhythm.  Pulmonary:     Effort: No tachypnea, accessory muscle usage or respiratory distress.     Breath sounds: Normal breath sounds. No decreased breath sounds or wheezing.  Chest:  Breasts:    Right: No inverted nipple, mass, nipple discharge or tenderness (no axillary adenopathy).     Left: No inverted nipple, mass, nipple discharge or tenderness (no axilarry adenopathy).  Abdominal:     General: Bowel sounds are normal.     Palpations: Abdomen is soft.     Tenderness: There is no abdominal tenderness.   Musculoskeletal:        General: No swelling or tenderness.     Cervical back: Neck supple.  Lymphadenopathy:     Cervical: No cervical adenopathy.  Skin:    Findings: No erythema or rash.  Neurological:     Mental Status: She is alert and oriented to person, place, and time.  Psychiatric:  Mood and Affect: Mood normal.        Behavior: Behavior normal.         Outpatient Encounter Medications as of 12/28/2023  Medication Sig   Calcium-Phosphorus-Vitamin D  (CITRACAL +D3 PO) Take 2 tablets by mouth daily.   carvedilol  (COREG ) 3.125 MG tablet Take 1 tablet (3.125 mg total) by mouth 2 (two) times daily with a meal.   Cholecalciferol (D3-1000 PO) Take by mouth daily.   Clindamycin -Benzoyl Per, Refr, gel Apply to affected area 2 (two) times daily.   hydrochlorothiazide  (MICROZIDE ) 12.5 MG capsule Take 1 capsule (12.5 mg total) by mouth daily.   hydrocortisone  2.5 % cream Apply 1 Application to afffectead area under breast topically 2 (two) times daily until clear.   ibuprofen (ADVIL) 200 MG tablet Take 200 mg by mouth as needed.   Lutein 20 MG TABS Take 20 mg by mouth daily.   Misc Natural Products (LUTEIN 20 PO) Take 1 each by mouth daily.   Multiple Vitamins-Minerals (CENTRUM ADULTS PO) Take by mouth.   TART CHERRY PO Take by mouth.   Turmeric (QC TUMERIC COMPLEX PO) Take by mouth.   [DISCONTINUED] carvedilol  (COREG ) 3.125 MG tablet Take 1 tablet (3.125 mg total) by mouth 2 (two) times daily with a meal.   [DISCONTINUED] hydrochlorothiazide  (MICROZIDE ) 12.5 MG capsule Take 1 capsule (12.5 mg total) by mouth daily.   [DISCONTINUED] Lutein-Zeaxanthin-Bilberry 20-4-40 MG CHEW Chew by mouth daily.   [DISCONTINUED] Misc Natural Products (AIRBORNE ELDERBERRY) CHEW Chew 2 each by mouth daily. Nature's Bounty Elderberry Gummies   [DISCONTINUED] mupirocin  ointment (BACTROBAN ) 2 % Apply 1 Application topically 2 (two) times daily.   [DISCONTINUED] potassium chloride  (KLOR-CON  M) 10 MEQ  tablet Take 1 tablet (10 mEq total) by mouth daily.   No facility-administered encounter medications on file as of 12/28/2023.     Lab Results  Component Value Date   WBC 4.6 12/28/2023   HGB 12.2 12/28/2023   HCT 36.6 12/28/2023   PLT 222.0 12/28/2023   GLUCOSE 86 12/28/2023   CHOL 242 (H) 12/28/2023   TRIG 93.0 12/28/2023   HDL 57.00 12/28/2023   LDLCALC 166 (H) 12/28/2023   ALT 15 12/28/2023   AST 18 12/28/2023   NA 138 12/28/2023   K 3.5 12/28/2023   CL 101 12/28/2023   CREATININE 0.63 12/28/2023   BUN 20 12/28/2023   CO2 31 12/28/2023   TSH 1.56 07/20/2023   HGBA1C 6.0 12/28/2023    DG Bone Density Result Date: 01/14/2023 EXAM: DUAL X-RAY ABSORPTIOMETRY (DXA) FOR BONE MINERAL DENSITY IMPRESSION: Your patient Synetta Gogue completed a BMD test on 01/14/2023 using the [employer] iDXA DXA System (software version: 14.10) manufactured by [employer]. The following summarizes the results of our evaluation. Technologist: SCE PATIENT BIOGRAPHICAL: Name: Sydney, [name] Patient ID: [id number] Birth Date: August 26, 1966 Height: 62.5 in. Gender: Female Exam Date: 01/14/2023 Weight: 176.7 lbs. Indications: Caucasian, History of Fracture (Adult), Osteoarthritis, Postmenopausal Fractures: Hand Left Treatments: calcium w/ vit D, meloxicam , Vitamin D  DENSITOMETRY RESULTS: Site      Region        Measured Date Measured Age WHO Classification Young Adult T-score BMD         %Change vs. Previous Significant Change (*) AP Spine L1-L4 (L2,L3) 01/14/2023 56.4 Normal 1.8 1.402 g/cm2 - - DualFemur Neck Right 01[age]14/2023 56.4 Normal -0.5 0.973 g/cm2 - - ASSESSMENT: The BMD measured at Femur Neck Right is 0.973 g/cm2 with a T-score of -0.5. This patient is considered normal according  to World Health Organization [facility]) criteria. The scan quality is good. L2 and L3 was excluded due to degenerative changes. World Science writer [facility]) criteria for post-menopausal, Caucasian Women: Normal:                    T-score at or above -1 SD Osteopenia/low bone mass: T-score between -1 and -2.5 SD Osteoporosis:             T-score at or below -2.5 SD RECOMMENDATIONS: 1. All patients should optimize calcium and vitamin D  intake. 2. Consider FDA-approved medical therapies in postmenopausal women and men aged 53 years and older, based on the following: a. A hip or vertebral(clinical or morphometric) fracture b. T-score < -2.5 at the femoral neck or spine after appropriate evaluation to exclude secondary causes c. Low bone mass (T-score between -1.0 and -2.5 at the femoral neck or spine) and a 10-year probability of a hip fracture > 3% or a 10-year probability of a major osteoporosis-related fracture > 20% based on the [place] -adapted WHO algorithm 3. Clinician judgment and/or patient preferences may indicate treatment for people with 10-year fracture probabilities above or below these levels FOLLOW-UP: People with diagnosed cases of osteoporosis or at high risk for fracture should have regular bone mineral density tests. For patients eligible for Medicare, routine testing is allowed once every 2 years. The testing frequency can be increased to one year for patients who have rapidly progressing disease, those who are receiving or discontinuing medical therapy to restore bone mass, or have additional risk factors. I have reviewed this report, and agree with the above findings. [facility] Electronically Signed   By: Peggie Bowen M.D.   On: 01/14/2023 09:04       Assessment & Plan:  Routine general medical examination at a health care facility  Hypokalemia  Leg cramps -     CBC with Differential/Platelet  Hyperglycemia Assessment & Plan: Low carb diet and exercise. Follow met b and A1c.   Orders: -     Hemoglobin A1c  Hypercholesteremia Assessment & Plan: The 10-year ASCVD risk score (Arnett DK, et al., 2019) is: 4.1%   Values used to calculate the score:     Age: 45 years     Sex: Female      Is Non-Hispanic African American: No     Diabetic: No     Tobacco smoker: No     Systolic Blood Pressure: 134 mmHg     Is BP treated: Yes     HDL Cholesterol: 57 mg/dL     Total Cholesterol: 242 mg/dL Low cholesterol diet and exercise.  Follow lipid panel. Check fasting lab today.   Orders: -     Hepatic function panel -     Lipid panel  Benign hypertension Assessment & Plan: Currently on hydrochlorothiazide  and carvedilol . Blood pressure doing well. No changes in medication. Follow pressures. Follow metabolic panel.   Orders: -     Basic metabolic panel with GFR  Health care maintenance Assessment & Plan: Physical today 12/28/23. Mammogram 01/2022 - Birads I. Overdue. Order placed for mammogram. PAP 08/10/22 - negative with negative HPV. Needs colonoscopy. Discussed cologuard. Notify me when agreeable.    Visit for screening mammogram -     3D Screening Mammogram, Left and Right; Future  Other orders -     Carvedilol ; Take 1 tablet (3.125 mg total) by mouth 2 (two) times daily with a meal.  Dispense: 180 tablet; Refill: 1 -  hydroCHLOROthiazide ; Take 1 capsule (12.5 mg total) by mouth daily.  Dispense: 90 capsule; Refill: 1     Dellar Fenton, MD

## 2023-12-29 ENCOUNTER — Other Ambulatory Visit: Payer: Self-pay

## 2023-12-29 MED ORDER — POTASSIUM CHLORIDE CRYS ER 10 MEQ PO TBCR
10.0000 meq | EXTENDED_RELEASE_TABLET | Freq: Every day | ORAL | 1 refills | Status: DC
  Filled 2023-12-29: qty 90, 90d supply, fill #0
  Filled 2024-03-22: qty 90, 90d supply, fill #1

## 2024-01-02 ENCOUNTER — Encounter: Payer: Self-pay | Admitting: Internal Medicine

## 2024-01-02 NOTE — Assessment & Plan Note (Signed)
 The 10-year ASCVD risk score (Arnett DK, et al., 2019) is: 4.1%   Values used to calculate the score:     Age: 58 years     Sex: Female     Is Non-Hispanic African American: No     Diabetic: No     Tobacco smoker: No     Systolic Blood Pressure: 134 mmHg     Is BP treated: Yes     HDL Cholesterol: 57 mg/dL     Total Cholesterol: 242 mg/dL Low cholesterol diet and exercise.  Follow lipid panel. Check fasting lab today.

## 2024-01-02 NOTE — Assessment & Plan Note (Signed)
 Low-carb diet and exercise.  Follow met b and A1c.

## 2024-01-02 NOTE — Assessment & Plan Note (Signed)
 Currently on hydrochlorothiazide  and carvedilol . Blood pressure doing well. No changes in medication. Follow pressures. Follow metabolic panel.

## 2024-01-09 ENCOUNTER — Other Ambulatory Visit: Payer: Self-pay

## 2024-01-18 ENCOUNTER — Encounter (INDEPENDENT_AMBULATORY_CARE_PROVIDER_SITE_OTHER): Payer: Self-pay

## 2024-02-08 ENCOUNTER — Ambulatory Visit (INDEPENDENT_AMBULATORY_CARE_PROVIDER_SITE_OTHER): Payer: Commercial Managed Care - PPO | Admitting: Vascular Surgery

## 2024-02-13 ENCOUNTER — Other Ambulatory Visit: Payer: Self-pay

## 2024-04-14 ENCOUNTER — Encounter

## 2024-04-24 IMAGING — MG MM DIGITAL SCREENING BILAT W/ TOMO AND CAD
8 series · 9 of 24 positions shown · non-contrast
Comparison: Previous exam(s).

CLINICAL DATA: Screening.

EXAM:
DIGITAL SCREENING BILATERAL MAMMOGRAM WITH TOMOSYNTHESIS AND CAD
TECHNIQUE: Bilateral screening digital craniocaudal and mediolateral oblique
mammograms were obtained. Bilateral screening digital breast
tomosynthesis was performed. The images were evaluated with
computer-aided detection.

[R CC synth-2D]
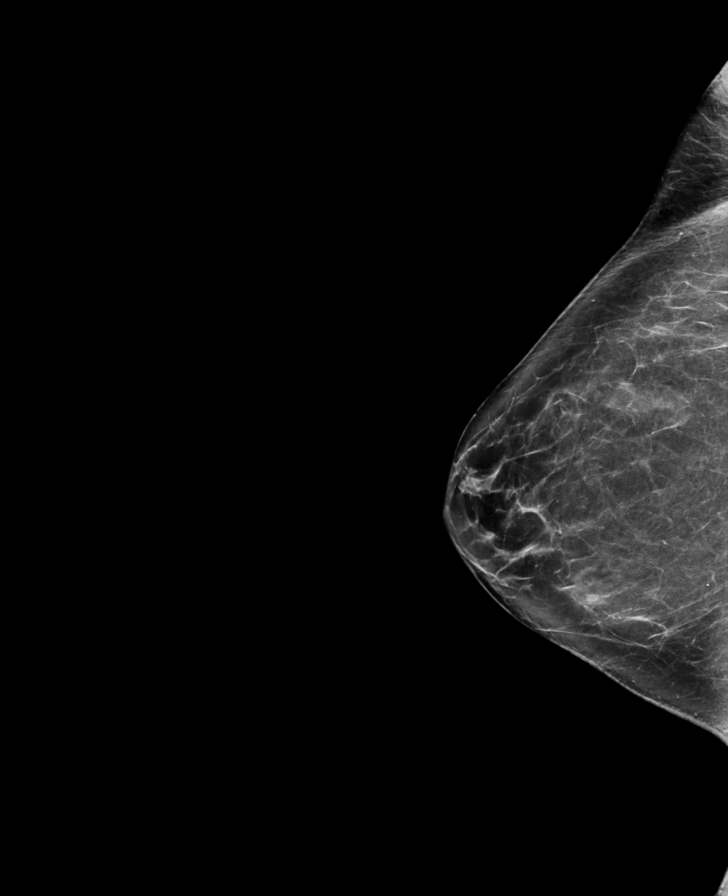

[R MLO synth-2D]
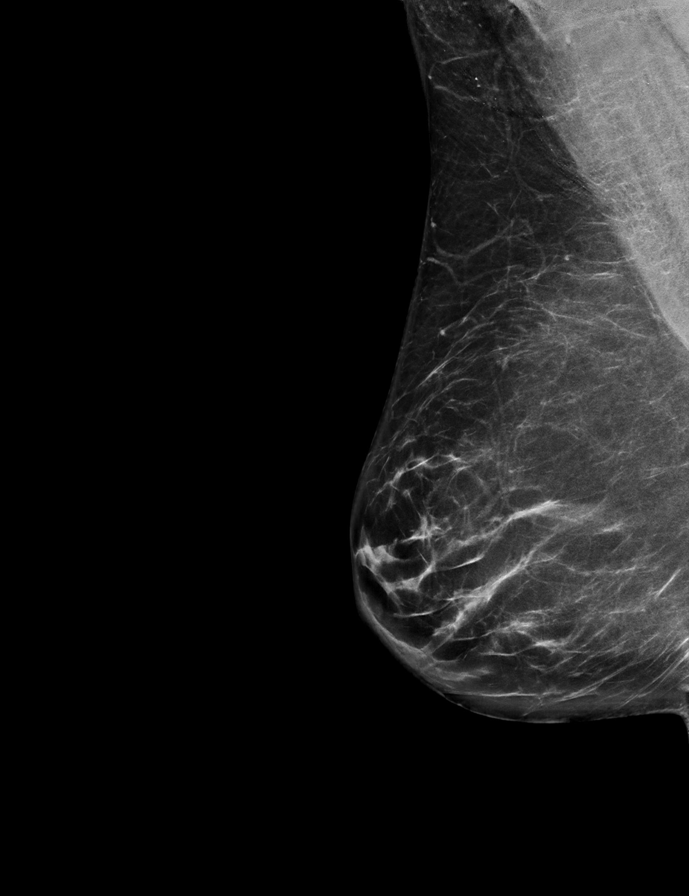

[L CC synth-2D]
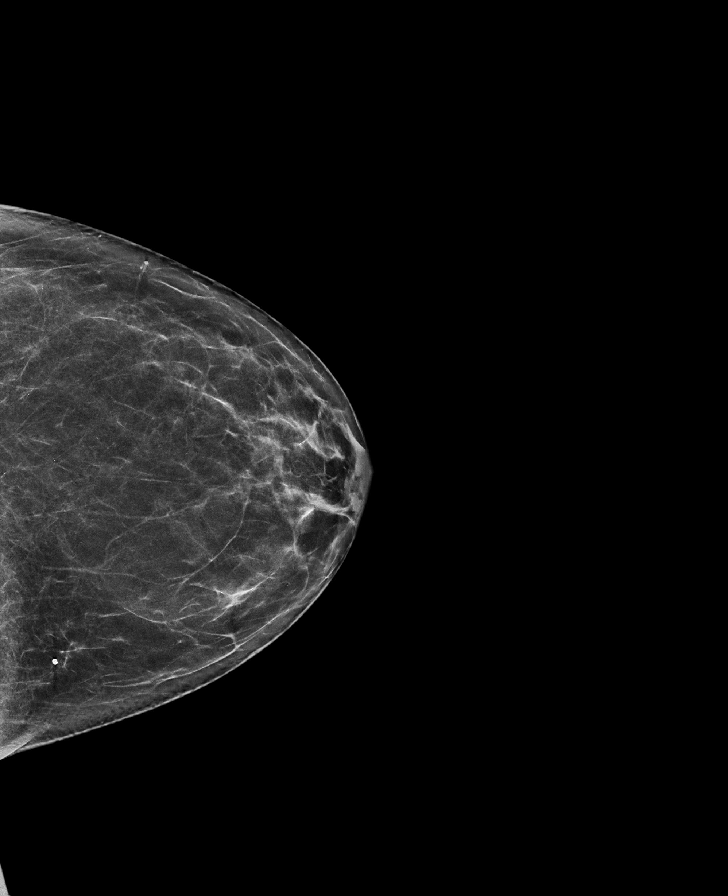

[L MLO synth-2D]
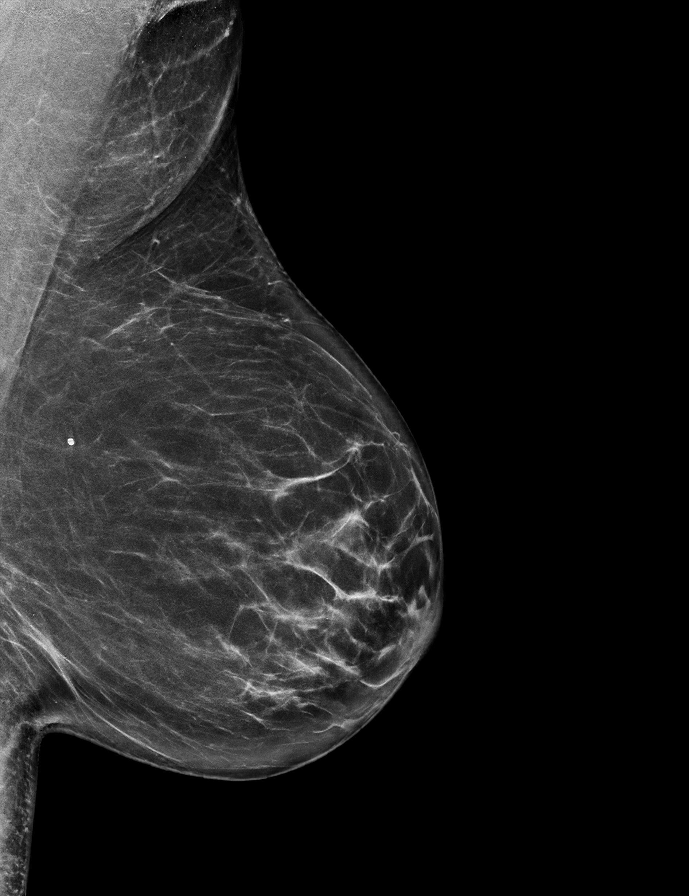

[R MLO tomo · 2 of 71 frames shown]
[frame 23/71]
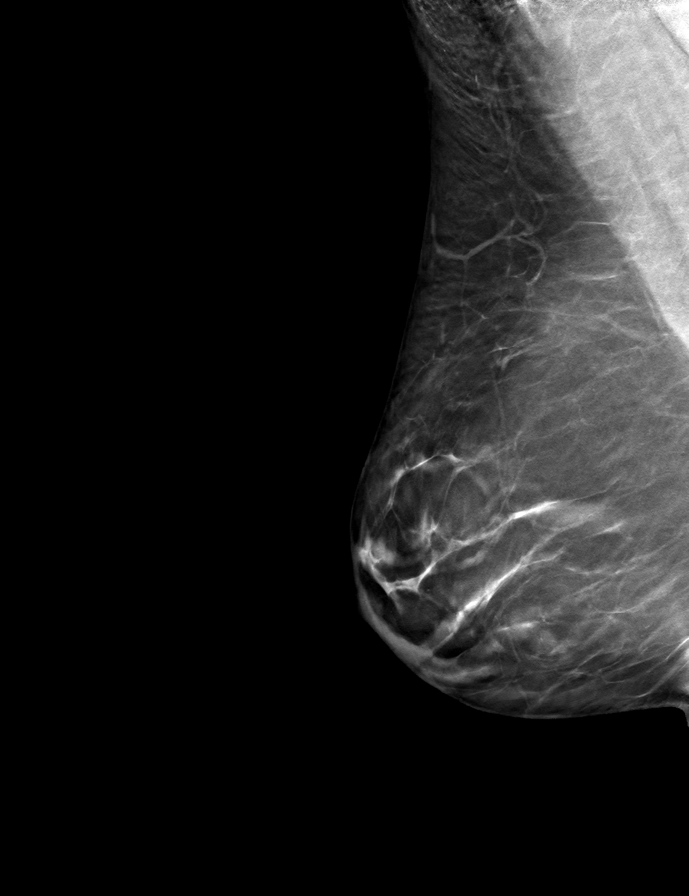
[frame 36/71]
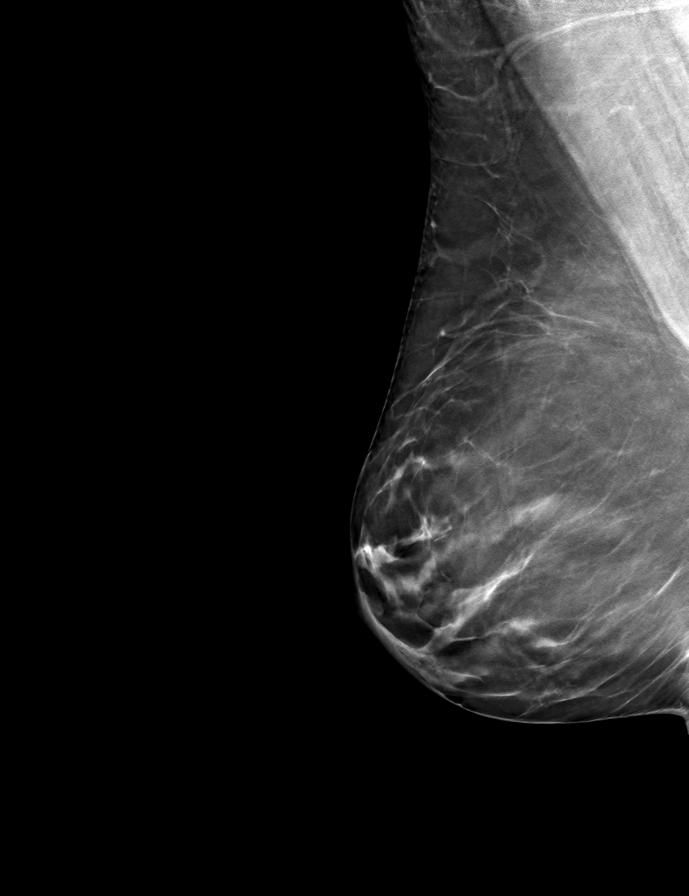

[L CC tomo · tomo slice 33/64.0]
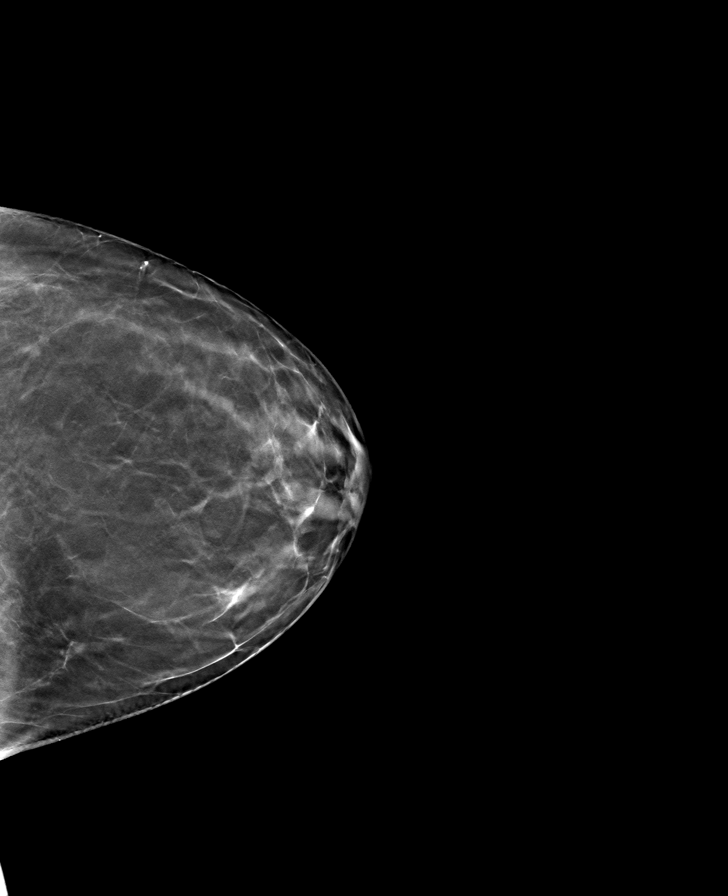

[R CC tomo · tomo slice 37/72.0]
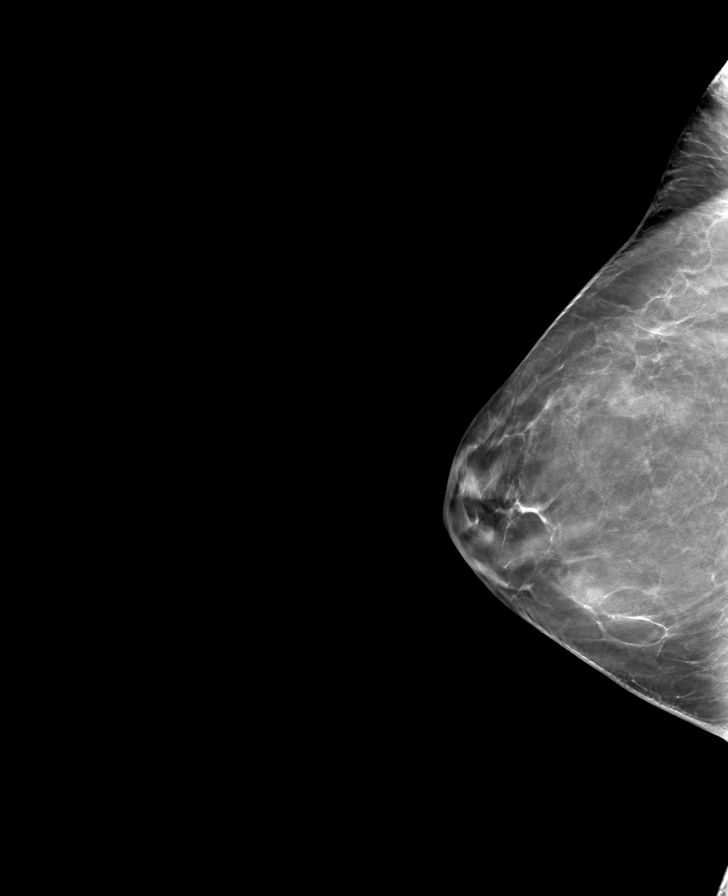

[L MLO tomo · tomo slice 36/71.0]
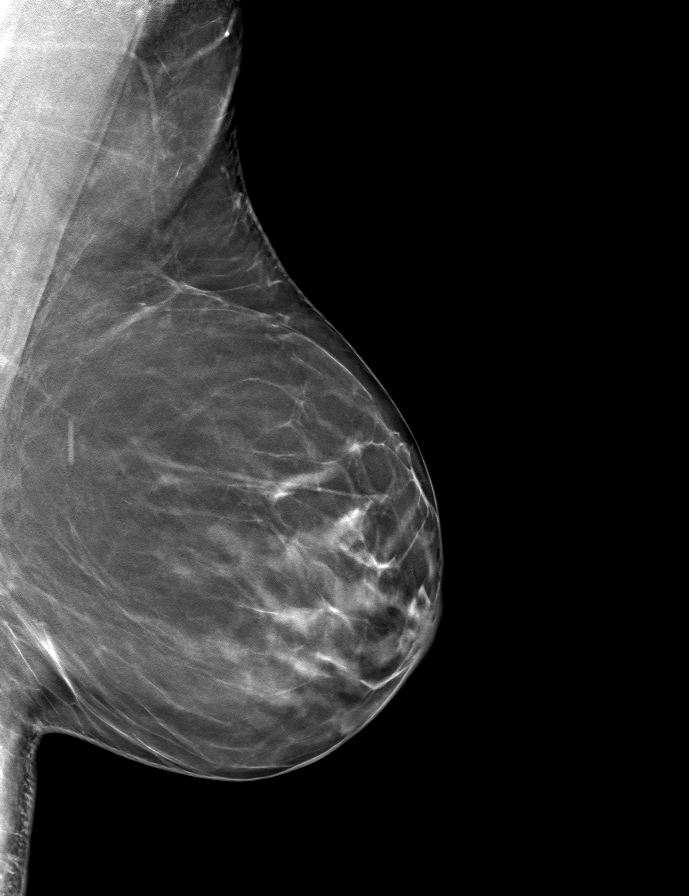

[9 of 24 positions shown; findings below may reference images not displayed]

ACR Breast Density Category b: There are scattered areas of
fibroglandular density.
FINDINGS: There are no findings suspicious for malignancy.
IMPRESSION: No mammographic evidence of malignancy. A result letter of this
screening mammogram will be mailed directly to the patient.

RECOMMENDATION:
Screening mammogram in one year. (Code:51-O-LD2)

BI-RADS CATEGORY  1: Negative.

## 2024-04-28 ENCOUNTER — Ambulatory Visit: Admitting: Internal Medicine

## 2024-04-28 ENCOUNTER — Other Ambulatory Visit: Payer: Self-pay

## 2024-04-28 DIAGNOSIS — I10 Essential (primary) hypertension: Secondary | ICD-10-CM

## 2024-04-28 DIAGNOSIS — F430 Acute stress reaction: Secondary | ICD-10-CM | POA: Diagnosis not present

## 2024-04-28 DIAGNOSIS — R200 Anesthesia of skin: Secondary | ICD-10-CM

## 2024-04-28 DIAGNOSIS — R739 Hyperglycemia, unspecified: Secondary | ICD-10-CM | POA: Diagnosis not present

## 2024-04-28 DIAGNOSIS — E7800 Pure hypercholesterolemia, unspecified: Secondary | ICD-10-CM | POA: Diagnosis not present

## 2024-04-28 LAB — BASIC METABOLIC PANEL WITH GFR
BUN: 22 mg/dL (ref 6–23)
CO2: 29 meq/L (ref 19–32)
Calcium: 9 mg/dL (ref 8.4–10.5)
Chloride: 103 meq/L (ref 96–112)
Creatinine, Ser: 0.53 mg/dL (ref 0.40–1.20)
GFR: 102.41 mL/min (ref >60.00)
Glucose, Bld: 89 mg/dL (ref 70–99)
Potassium: 3.8 meq/L (ref 3.5–5.1)
Sodium: 141 meq/L (ref 135–145)

## 2024-04-28 MED ORDER — CARVEDILOL 3.125 MG PO TABS
3.1250 mg | ORAL_TABLET | Freq: Two times a day (BID) | ORAL | 1 refills | Status: DC
  Filled 2024-04-28 – 2024-07-07 (×2): qty 180, 90d supply, fill #0

## 2024-04-28 MED ORDER — POTASSIUM CHLORIDE CRYS ER 10 MEQ PO TBCR
10.0000 meq | EXTENDED_RELEASE_TABLET | Freq: Every day | ORAL | 1 refills | Status: DC
  Filled 2024-04-28 – 2024-06-27 (×2): qty 90, 90d supply, fill #0

## 2024-04-28 MED ORDER — TRIAMCINOLONE ACETONIDE 0.1 % EX CREA
1.0000 | TOPICAL_CREAM | Freq: Two times a day (BID) | CUTANEOUS | 0 refills | Status: AC
  Filled 2024-04-28: qty 30, 15d supply, fill #0

## 2024-04-28 NOTE — Progress Notes (Signed)
 Subjective:    Patient ID: Sydney [name], female    DOB: [birth year], 58 y.o.   MRN: [id number]  Patient here for  Chief Complaint  Patient presents with   Medical Management of Chronic Issues    HPI Here for a scheduled follow up - follow up regarding her blood pressure. Also f/u regarding hypercholesterolemia and hyperglycemia. Continues on hydrochlorothiazide  and carvedilol . Tolerating these medications. Scheduled for mammogram 05/04/24. Reports - scaling/itching - earlobes. Breathing stable. No abdominal pain or bowel change reported. Increased stress. Overall appears to be handling things relatively well.    Past Medical History:  Diagnosis Date   Allergy    Anemia    Eczema    GERD (gastroesophageal reflux disease)    Hematuria    Hyperlipidemia    Hypertension    Kidney stone on left side    Lump in female breast    seen by Dr. Lovenia   Metabolic syndrome    Microscopic hematuria    Vitreous detachment of right eye 08/22/2019   Dr. Jaye,  [facility]   Past Surgical History:  Procedure Laterality Date   oral surgery  Left 10/2020   molar tooth implant    URETHRAL DILATION  Age 63   Family History  Problem Relation Age of Onset   Hypertension Mother    Hyperlipidemia Mother    Dementia Mother    Diabetes Father    CAD Father    Psoriasis Father    Heart disease Father    Cancer Father        Prostate and Multiple Myeloma   Asthma Sister    Thyroid  disease Sister    Diabetes Sister    Breast cancer Neg Hx    Social History   Socioeconomic History   Marital status: Single    Spouse name: Not on file   Number of children: 0   Years of education: Not on file   Highest education level: Bachelor's degree (e.g., BA, AB, BS)  Occupational History    Employer: [employer]    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  Tobacco Use   Smoking status: Never   Smokeless tobacco: Never  Vaping Use   Vaping status: Never Used  Substance and  Sexual Activity   Alcohol use: No    Alcohol/week: 0.0 standard drinks of alcohol   Drug use: No   Sexual activity: Not Currently  Other Topics Concern   Not on file  Social History Narrative   Works for [employer], lives alone   She has one cat   Mother is in memory care now    Social Drivers of Health   Financial Resource Strain: Low Risk  (08/28/2021)   Overall Financial Resource Strain (CARDIA)    Difficulty of Paying Living Expenses: Not hard at all  Food Insecurity: No Food Insecurity (08/28/2021)   Hunger Vital Sign    Worried About Running Out of Food in the Last Year: Never true    Ran Out of Food in the Last Year: Never true  Transportation Needs: No Transportation Needs (08/28/2021)   PRAPARE - Administrator, Civil Service (Medical): No    Lack of Transportation (Non-Medical): No  Physical Activity: Inactive (08/28/2021)   Exercise Vital Sign    Days of Exercise per Week: 0 days    Minutes of Exercise per Session: 0 min  Stress: No Stress Concern Present (08/28/2021)   [employer] of Occupational Health - Occupational Stress Questionnaire  Feeling of Stress : Only a little  Social Connections: Moderately Integrated (08/28/2021)   Social Connection and Isolation Panel    Frequency of Communication with Friends and Family: Three times a week    Frequency of Social Gatherings with Friends and Family: Once a week    Attends Religious Services: More than 4 times per year    Active Member of [employer] or Organizations: Yes    Attends Banker Meetings: 1 to 4 times per year    Marital Status: Never married     Review of Systems  Constitutional:  Negative for appetite change and unexpected weight change.  HENT:  Negative for congestion and sinus pressure.   Respiratory:  Negative for cough, chest tightness and shortness of breath.   Cardiovascular:  Negative for chest pain, palpitations and leg swelling.  Gastrointestinal:  Negative for  abdominal pain, diarrhea, nausea and vomiting.  Genitourinary:  Negative for difficulty urinating and dysuria.  Musculoskeletal:  Negative for joint swelling and myalgias.  Skin:  Negative for color change and rash.  Neurological:  Negative for dizziness and headaches.  Psychiatric/Behavioral:  Negative for agitation and dysphoric mood.        Increased stress.        Objective:     BP 136/74   Pulse 89   Resp 16   Ht 5' 3 (1.6 m)   Wt 171 lb 6.4 oz (77.7 kg)   LMP 07/21/2015 (Approximate)   SpO2 98%   BMI 30.36 kg/m  Wt Readings from Last 3 Encounters:  04/28/24 171 lb 6.4 oz (77.7 kg)  12/28/23 177 lb (80.3 kg)  07/28/23 176 lb (79.8 kg)    Physical Exam Vitals reviewed.  Constitutional:      General: She is not in acute distress.    Appearance: Normal appearance.  HENT:     Head: Normocephalic and atraumatic.     Right Ear: External ear normal.     Left Ear: External ear normal.     Mouth/Throat:     Pharynx: No oropharyngeal exudate or posterior oropharyngeal erythema.  Eyes:     General: No scleral icterus.       Right eye: No discharge.        Left eye: No discharge.     Conjunctiva/sclera: Conjunctivae normal.  Neck:     Thyroid : No thyromegaly.  Cardiovascular:     Rate and Rhythm: Normal rate and regular rhythm.  Pulmonary:     Effort: No respiratory distress.     Breath sounds: Normal breath sounds. No wheezing.  Abdominal:     General: Bowel sounds are normal.     Palpations: Abdomen is soft.     Tenderness: There is no abdominal tenderness.  Musculoskeletal:        General: No swelling or tenderness.     Cervical back: Neck supple. No tenderness.  Lymphadenopathy:     Cervical: No cervical adenopathy.  Skin:    Findings: No erythema or rash.  Neurological:     Mental Status: She is alert.  Psychiatric:        Mood and Affect: Mood normal.        Behavior: Behavior normal.         Outpatient Encounter Medications as of 04/28/2024   Medication Sig   triamcinolone  cream (KENALOG ) 0.1 % Apply 1 Application topically 2 (two) times daily.   [DISCONTINUED] CALCIUM PO Take by mouth.   Calcium-Phosphorus-Vitamin D  (CITRACAL +D3 PO) Take 2 tablets  by mouth daily.   carvedilol  (COREG ) 3.125 MG tablet Take 1 tablet (3.125 mg total) by mouth 2 (two) times daily with a meal.   Cholecalciferol (D3-1000 PO) Take by mouth daily.   Clindamycin -Benzoyl Per, Refr, gel Apply to affected area 2 (two) times daily.   hydrochlorothiazide  (MICROZIDE ) 12.5 MG capsule Take 1 capsule (12.5 mg total) by mouth daily.   hydrocortisone  2.5 % cream Apply 1 Application to afffectead area under breast topically 2 (two) times daily until clear.   ibuprofen (ADVIL) 200 MG tablet Take 200 mg by mouth as needed.   Lutein 20 MG TABS Take 20 mg by mouth daily.   Misc Natural Products (LUTEIN 20 PO) Take 1 each by mouth daily.   Multiple Vitamins-Minerals (CENTRUM ADULTS PO) Take by mouth.   potassium chloride  (KLOR-CON  M) 10 MEQ tablet Take 1 tablet (10 mEq total) by mouth daily.   TART CHERRY PO Take by mouth.   Turmeric (QC TUMERIC COMPLEX PO) Take by mouth.   [DISCONTINUED] carvedilol  (COREG ) 3.125 MG tablet Take 1 tablet (3.125 mg total) by mouth 2 (two) times daily with a meal.   [DISCONTINUED] potassium chloride  (KLOR-CON  M) 10 MEQ tablet Take 1 tablet (10 mEq total) by mouth daily.   No facility-administered encounter medications on file as of 04/28/2024.     Lab Results  Component Value Date   WBC 4.6 12/28/2023   HGB 12.2 12/28/2023   HCT 36.6 12/28/2023   PLT 222.0 12/28/2023   GLUCOSE 89 04/28/2024   CHOL 242 (H) 12/28/2023   TRIG 93.0 12/28/2023   HDL 57.00 12/28/2023   LDLCALC 166 (H) 12/28/2023   ALT 15 12/28/2023   AST 18 12/28/2023   NA 141 04/28/2024   K 3.8 04/28/2024   CL 103 04/28/2024   CREATININE 0.53 04/28/2024   BUN 22 04/28/2024   CO2 29 04/28/2024   TSH 1.56 07/20/2023   HGBA1C 6.0 12/28/2023    DG Bone  Density Result Date: 01/14/2023 EXAM: DUAL X-RAY ABSORPTIOMETRY (DXA) FOR BONE MINERAL DENSITY IMPRESSION: Your patient Sydney [name] completed a BMD test on 01/14/2023 using the [employer] iDXA DXA System (software version: 14.10) manufactured by [employer]. The following summarizes the results of our evaluation. Technologist: SCE PATIENT BIOGRAPHICAL: Name: Sydney [name], Sydney [name] Patient ID: [id number] Birth Date: 15-[name]-1967 Height: 62.5 in. Gender: Female Exam Date: 01/14/2023 Weight: 176.7 lbs. Indications: Caucasian, History of Fracture (Adult), Osteoarthritis, Postmenopausal Fractures: Hand Left Treatments: calcium w/ vit D, meloxicam , Vitamin D  DENSITOMETRY RESULTS: Site      Region        Measured Date Measured Age WHO Classification Young Adult T-score BMD         %Change vs. Previous Significant Change (*) AP Spine L1-L4 (L2,L3) 01/14/2023 56.4 Normal 1.8 1.402 g/cm2 - - DualFemur Neck Right 1/14/2023 56.4 Normal -0.5 0.973 g/cm2 - - ASSESSMENT: The BMD measured at Femur Neck Right is 0.973 g/cm2 with a T-score of -0.5. This patient is considered normal according to World Health Organization [facility]) criteria. The scan quality is good. L2 and L3 was excluded due to degenerative changes. World Science writer [facility]) criteria for post-menopausal, Caucasian Women: Normal:                   T-score at or above -1 SD Osteopenia/low bone mass: T-score between -1 and -2.5 SD Osteoporosis:             T-score at or below -2.5 SD RECOMMENDATIONS: 1. All patients should  optimize calcium and vitamin D  intake. 2. Consider FDA-approved medical therapies in postmenopausal women and men aged 51 years and older, based on the following: a. A hip or vertebral(clinical or morphometric) fracture b. T-score < -2.5 at the femoral neck or spine after appropriate evaluation to exclude secondary causes c. Low bone mass (T-score between -1.0 and -2.5 at the femoral neck or spine) and a 10-year probability of a hip  fracture > 3% or a 10-year probability of a major osteoporosis-related fracture > 20% based on the [place] -adapted WHO algorithm 3. Clinician judgment and/or patient preferences may indicate treatment for people with 10-year fracture probabilities above or below these levels FOLLOW-UP: People with diagnosed cases of osteoporosis or at high risk for fracture should have regular bone mineral density tests. For patients eligible for Medicare, routine testing is allowed once every 2 years. The testing frequency can be increased to one year for patients who have rapidly progressing disease, those who are receiving or discontinuing medical therapy to restore bone mass, or have additional risk factors. I have reviewed this report, and agree with the above findings. [facility] Electronically Signed   By: Lael Hines M.D.   On: 01/14/2023 09:04       Assessment & Plan:  Benign hypertension Assessment & Plan: Currently on hydrochlorothiazide  and carvedilol . Blood pressure as outlined. Have her spot check her pressures.  No changes in medication today. Check metabolic panel.   Orders: -     Basic metabolic panel with GFR  Hypercholesteremia Assessment & Plan: The 10-year ASCVD risk score (Arnett DK, et al., 2019) is: 4.3%   Values used to calculate the score:     Age: 40 years     Clincally relevant sex: Female     Is Non-Hispanic African American: No     Diabetic: No     Tobacco smoker: No     Systolic Blood Pressure: 136 mmHg     Is BP treated: Yes     HDL Cholesterol: 57 mg/dL     Total Cholesterol: 242 mg/dL Low cholesterol diet and exercise.  Follow lipid panel.    Hyperglycemia Assessment & Plan: Low carb diet and exercise. Follow met b and A1c.    Facial numbness  Stress reaction  Other orders -     Carvedilol ; Take 1 tablet (3.125 mg total) by mouth 2 (two) times daily with a meal.  Dispense: 180 tablet; Refill: 1 -     Potassium Chloride  Crys ER; Take 1 tablet (10  mEq total) by mouth daily.  Dispense: 90 tablet; Refill: 1 -     Triamcinolone  Acetonide; Apply 1 Application topically 2 (two) times daily.  Dispense: 30 g; Refill: 0     Allena Hamilton, MD

## 2024-04-30 ENCOUNTER — Encounter: Payer: Self-pay | Admitting: Internal Medicine

## 2024-04-30 ENCOUNTER — Ambulatory Visit: Payer: Self-pay | Admitting: Internal Medicine

## 2024-04-30 NOTE — Assessment & Plan Note (Signed)
 Low-carb diet and exercise.  Follow met b and A1c.

## 2024-04-30 NOTE — Assessment & Plan Note (Signed)
 Currently on hydrochlorothiazide  and carvedilol . Blood pressure as outlined. Have her spot check her pressures.  No changes in medication today. Check metabolic panel.

## 2024-04-30 NOTE — Assessment & Plan Note (Signed)
 The 10-year ASCVD risk score (Arnett DK, et al., 2019) is: 4.3%   Values used to calculate the score:     Age: 58 years     Clincally relevant sex: Female     Is Non-Hispanic African American: No     Diabetic: No     Tobacco smoker: No     Systolic Blood Pressure: 136 mmHg     Is BP treated: Yes     HDL Cholesterol: 57 mg/dL     Total Cholesterol: 242 mg/dL Low cholesterol diet and exercise.  Follow lipid panel.

## 2024-05-04 ENCOUNTER — Ambulatory Visit
Admission: RE | Admit: 2024-05-04 | Discharge: 2024-05-04 | Disposition: A | Discharge status: Home / Self Care | Source: Ambulatory Visit | Attending: Internal Medicine | Admitting: Internal Medicine

## 2024-05-04 DIAGNOSIS — Z1231 Encounter for screening mammogram for malignant neoplasm of breast: Secondary | ICD-10-CM | POA: Diagnosis not present

## 2024-06-27 ENCOUNTER — Other Ambulatory Visit: Payer: Self-pay

## 2024-07-07 ENCOUNTER — Other Ambulatory Visit: Payer: Self-pay

## 2024-08-01 ENCOUNTER — Ambulatory Visit: Admitting: Internal Medicine

## 2024-08-17 ENCOUNTER — Ambulatory Visit: Admitting: Internal Medicine

## 2024-08-17 ENCOUNTER — Other Ambulatory Visit: Payer: Self-pay

## 2024-08-17 ENCOUNTER — Encounter: Payer: Self-pay | Admitting: Internal Medicine

## 2024-08-17 VITALS — BP 136/76 | HR 75 | Temp 97.9°F | Ht 63.0 in | Wt 175.0 lb

## 2024-08-17 DIAGNOSIS — Z862 Personal history of diseases of the blood and blood-forming organs and certain disorders involving the immune mechanism: Secondary | ICD-10-CM | POA: Diagnosis not present

## 2024-08-17 DIAGNOSIS — R739 Hyperglycemia, unspecified: Secondary | ICD-10-CM

## 2024-08-17 DIAGNOSIS — R0989 Other specified symptoms and signs involving the circulatory and respiratory systems: Secondary | ICD-10-CM | POA: Diagnosis not present

## 2024-08-17 DIAGNOSIS — E7800 Pure hypercholesterolemia, unspecified: Secondary | ICD-10-CM

## 2024-08-17 DIAGNOSIS — R3129 Other microscopic hematuria: Secondary | ICD-10-CM

## 2024-08-17 DIAGNOSIS — I10 Essential (primary) hypertension: Secondary | ICD-10-CM | POA: Diagnosis not present

## 2024-08-17 LAB — CBC WITH DIFFERENTIAL/PLATELET
Basophils Absolute: 0.1 K/uL (ref 0.0–0.1)
Basophils Relative: 1.3 % (ref 0.0–3.0)
Eosinophils Absolute: 0.3 K/uL (ref 0.0–0.7)
Eosinophils Relative: 5.8 % — ABNORMAL HIGH (ref 0.0–5.0)
HCT: 34.3 % — ABNORMAL LOW (ref 36.0–46.0)
Hemoglobin: 11.6 g/dL — ABNORMAL LOW (ref 12.0–15.0)
Lymphocytes Relative: 19.6 % (ref 12.0–46.0)
Lymphs Abs: 0.9 K/uL (ref 0.7–4.0)
MCHC: 33.8 g/dL (ref 30.0–36.0)
MCV: 88.8 fl (ref 78.0–100.0)
Monocytes Absolute: 0.4 K/uL (ref 0.1–1.0)
Monocytes Relative: 9.6 % (ref 3.0–12.0)
Neutro Abs: 3 K/uL (ref 1.4–7.7)
Neutrophils Relative %: 63.7 % (ref 43.0–77.0)
Platelets: 222 K/uL (ref 150.0–400.0)
RBC: 3.86 Mil/uL — ABNORMAL LOW (ref 3.87–5.11)
RDW: 13.5 % (ref 11.5–15.5)
WBC: 4.7 K/uL (ref 4.0–10.5)

## 2024-08-17 LAB — BASIC METABOLIC PANEL WITH GFR
BUN: 19 mg/dL (ref 6–23)
CO2: 30 meq/L (ref 19–32)
Calcium: 8.9 mg/dL (ref 8.4–10.5)
Chloride: 102 meq/L (ref 96–112)
Creatinine, Ser: 0.59 mg/dL (ref 0.40–1.20)
GFR: 99.58 mL/min (ref >60.00)
Glucose, Bld: 94 mg/dL (ref 70–99)
Potassium: 4.6 meq/L (ref 3.5–5.1)
Sodium: 139 meq/L (ref 135–145)

## 2024-08-17 LAB — HEPATIC FUNCTION PANEL
ALT: 13 U/L (ref 3–35)
AST: 17 U/L (ref 5–37)
Albumin: 3.9 g/dL (ref 3.5–5.2)
Alkaline Phosphatase: 79 U/L (ref 39–117)
Bilirubin, Direct: 0.1 mg/dL (ref 0.1–0.3)
Total Bilirubin: 0.6 mg/dL (ref 0.2–1.2)
Total Protein: 6.6 g/dL (ref 6.0–8.3)

## 2024-08-17 MED ORDER — CARVEDILOL 3.125 MG PO TABS
3.1250 mg | ORAL_TABLET | Freq: Two times a day (BID) | ORAL | 0 refills | Status: AC
  Filled 2024-08-17 – 2024-09-19 (×2): qty 180, 90d supply, fill #0

## 2024-08-17 MED ORDER — POTASSIUM CHLORIDE CRYS ER 10 MEQ PO TBCR
10.0000 meq | EXTENDED_RELEASE_TABLET | Freq: Every day | ORAL | 0 refills | Status: AC
  Filled 2024-08-17 – 2024-09-19 (×2): qty 90, 90d supply, fill #0

## 2024-08-17 MED ORDER — HYDROCHLOROTHIAZIDE 12.5 MG PO CAPS
12.5000 mg | ORAL_CAPSULE | Freq: Every day | ORAL | 0 refills | Status: DC
  Filled 2024-08-17: qty 90, 90d supply, fill #0

## 2024-08-17 NOTE — Progress Notes (Signed)
 "  Subjective:    Patient ID: Sydney [name], female    DOB: [birth year], 58 y.o.   MRN: [id number]  Patient here for  Chief Complaint  Patient presents with   Medical Management of Chronic Issues    HPI Here for a scheduled follow up - follow up regarding hypertension, hypercholesterolemia and hyperglycemia. Continues on hydrochlorothiazide  and carvedilol . Has had increased stress. Overall appears to be handling things relatively well. Reports last week - started with scratchy throat. Developed runny nose. Dry cough. No vomiting. No diarrhea. Taking mucinex DM. No fever. No sob. No abdominal pain or bowel change reported.    Past Medical History:  Diagnosis Date   Allergy    Anemia    Eczema    GERD (gastroesophageal reflux disease)    Hematuria    Hyperlipidemia    Hypertension    Kidney stone on left side    Lump in female breast    seen by Dr. Lovenia   Metabolic syndrome    Microscopic hematuria    Vitreous detachment of right eye 08/22/2019   Dr. Jaye,  [facility]   Past Surgical History:  Procedure Laterality Date   oral surgery  Left 10/2020   molar tooth implant    URETHRAL DILATION  Age 63   Family History  Problem Relation Age of Onset   Hypertension Mother    Hyperlipidemia Mother    Dementia Mother    Diabetes Father    CAD Father    Psoriasis Father    Heart disease Father    Cancer Father        Prostate and Multiple Myeloma   Asthma Sister    Thyroid  disease Sister    Diabetes Sister    Breast cancer Neg Hx    Social History   Socioeconomic History   Marital status: Single    Spouse name: Not on file   Number of children: 0   Years of education: Not on file   Highest education level: Bachelor's degree (e.g., BA, AB, BS)  Occupational History    Employer: [employer]    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  Tobacco Use   Smoking status: Never   Smokeless tobacco: Never  Vaping Use   Vaping status: Never Used   Substance and Sexual Activity   Alcohol use: No    Alcohol/week: 0.0 standard drinks of alcohol   Drug use: No   Sexual activity: Not Currently  Other Topics Concern   Not on file  Social History Narrative   Works for [employer], lives alone   She has one cat   Mother is in memory care now    Social Drivers of Health   Tobacco Use: Low Risk (08/27/2024)   Patient History    Smoking Tobacco Use: Never    Smokeless Tobacco Use: Never    Passive Exposure: Not on file  Financial Resource Strain: Low Risk (08/28/2021)   Overall Financial Resource Strain (CARDIA)    Difficulty of Paying Living Expenses: Not hard at all  Food Insecurity: No Food Insecurity (08/28/2021)   Hunger Vital Sign    Worried About Running Out of Food in the Last Year: Never true    Ran Out of Food in the Last Year: Never true  Transportation Needs: No Transportation Needs (08/28/2021)   PRAPARE - Administrator, Civil Service (Medical): No    Lack of Transportation (Non-Medical): No  Physical Activity: Inactive (08/28/2021)   Exercise Vital Sign  Days of Exercise per Week: 0 days    Minutes of Exercise per Session: 0 min  Stress: No Stress Concern Present (08/28/2021)   [employer] of Occupational Health - Occupational Stress Questionnaire    Feeling of Stress : Only a little  Social Connections: Moderately Integrated (08/28/2021)   Social Connection and Isolation Panel    Frequency of Communication with Friends and Family: Three times a week    Frequency of Social Gatherings with Friends and Family: Once a week    Attends Religious Services: More than 4 times per year    Active Member of Clubs or Organizations: Yes    Attends Banker Meetings: 1 to 4 times per year    Marital Status: Never married  Depression (PHQ2-9): Low Risk (08/17/2024)   Depression (PHQ2-9)    PHQ-2 Score: 2  Alcohol Screen: Low Risk (08/28/2021)   Alcohol Screen    Last Alcohol Screening Score  (AUDIT): 0  Housing: Low Risk (08/28/2021)   Housing    Last Housing Risk Score: 0  Utilities: Not on file  Health Literacy: Not on file     Review of Systems  Constitutional:  Negative for appetite change and unexpected weight change.  HENT:         Runny nose. Previous scratchy throat.   Respiratory:  Negative for chest tightness and shortness of breath.        Dry cough.   Cardiovascular:  Negative for chest pain, palpitations and leg swelling.  Gastrointestinal:  Negative for abdominal pain, diarrhea, nausea and vomiting.  Genitourinary:  Negative for difficulty urinating and dysuria.  Musculoskeletal:  Negative for joint swelling and myalgias.  Skin:  Negative for color change and rash.  Neurological:  Negative for dizziness and headaches.  Psychiatric/Behavioral:  Negative for agitation and dysphoric mood.        Objective:     BP 136/76   Pulse 75   Temp 97.9 F (36.6 C) (Oral)   Ht 5' 3 (1.6 m)   Wt 175 lb (79.4 kg)   LMP 07/21/2015   SpO2 98%   BMI 31.00 kg/m  Wt Readings from Last 3 Encounters:  08/17/24 175 lb (79.4 kg)  04/28/24 171 lb 6.4 oz (77.7 kg)  12/28/23 177 lb (80.3 kg)    Physical Exam Vitals reviewed.  Constitutional:      General: She is not in acute distress.    Appearance: Normal appearance.  HENT:     Head: Normocephalic and atraumatic.     Right Ear: External ear normal.     Left Ear: External ear normal.  Eyes:     General: No scleral icterus.       Right eye: No discharge.        Left eye: No discharge.     Conjunctiva/sclera: Conjunctivae normal.  Neck:     Thyroid : No thyromegaly.  Cardiovascular:     Rate and Rhythm: Normal rate and regular rhythm.  Pulmonary:     Effort: No respiratory distress.     Breath sounds: Normal breath sounds. No wheezing.  Abdominal:     General: Bowel sounds are normal.     Palpations: Abdomen is soft.     Tenderness: There is no abdominal tenderness.  Musculoskeletal:        General:  No swelling or tenderness.     Cervical back: Neck supple. No tenderness.  Lymphadenopathy:     Cervical: No cervical adenopathy.  Skin:    Findings:  No erythema or rash.  Neurological:     Mental Status: She is alert.  Psychiatric:        Mood and Affect: Mood normal.        Behavior: Behavior normal.         Outpatient Encounter Medications as of 08/17/2024  Medication Sig   Calcium-Phosphorus-Vitamin D  (CITRACAL +D3 PO) Take 2 tablets by mouth daily.   carvedilol  (COREG ) 3.125 MG tablet Take 1 tablet (3.125 mg total) by mouth 2 (two) times daily with a meal.   Cholecalciferol (D3-1000 PO) Take by mouth daily.   Clindamycin -Benzoyl Per, Refr, gel Apply to affected area 2 (two) times daily.   hydrochlorothiazide  (MICROZIDE ) 12.5 MG capsule Take 1 capsule (12.5 mg total) by mouth daily.   hydrocortisone  2.5 % cream Apply 1 Application to afffectead area under breast topically 2 (two) times daily until clear.   ibuprofen (ADVIL) 200 MG tablet Take 200 mg by mouth as needed.   Lutein 20 MG TABS Take 20 mg by mouth daily.   Misc Natural Products (LUTEIN 20 PO) Take 1 each by mouth daily.   Multiple Vitamins-Minerals (CENTRUM ADULTS PO) Take by mouth.   potassium chloride  (KLOR-CON  M) 10 MEQ tablet Take 1 tablet (10 mEq total) by mouth daily.   TART CHERRY PO Take by mouth.   triamcinolone  cream (KENALOG ) 0.1 % Apply 1 Application topically 2 (two) times daily.   Turmeric (QC TUMERIC COMPLEX PO) Take by mouth.   [DISCONTINUED] carvedilol  (COREG ) 3.125 MG tablet Take 1 tablet (3.125 mg total) by mouth 2 (two) times daily with a meal.   [DISCONTINUED] hydrochlorothiazide  (MICROZIDE ) 12.5 MG capsule Take 1 capsule (12.5 mg total) by mouth daily.   [DISCONTINUED] potassium chloride  (KLOR-CON  M) 10 MEQ tablet Take 1 tablet (10 mEq total) by mouth daily.   No facility-administered encounter medications on file as of 08/17/2024.     Lab Results  Component Value Date   WBC 4.7  08/17/2024   HGB 11.6 (L) 08/17/2024   HCT 34.3 (L) 08/17/2024   PLT 222.0 08/17/2024   GLUCOSE 94 08/17/2024   CHOL 242 (H) 12/28/2023   TRIG 93.0 12/28/2023   HDL 57.00 12/28/2023   LDLCALC 166 (H) 12/28/2023   ALT 13 08/17/2024   AST 17 08/17/2024   NA 139 08/17/2024   K 4.6 08/17/2024   CL 102 08/17/2024   CREATININE 0.59 08/17/2024   BUN 19 08/17/2024   CO2 30 08/17/2024   TSH 1.56 07/20/2023   HGBA1C 6.0 12/28/2023    MM 3D SCREENING MAMMOGRAM BILATERAL BREAST Result Date: 05/09/2024 CLINICAL DATA:  Screening. EXAM: DIGITAL SCREENING BILATERAL MAMMOGRAM WITH TOMOSYNTHESIS AND CAD TECHNIQUE: Bilateral screening digital craniocaudal and mediolateral oblique mammograms were obtained. Bilateral screening digital breast tomosynthesis was performed. The images were evaluated with computer-aided detection. COMPARISON:  Previous exam(s). ACR Breast Density Category b: There are scattered areas of fibroglandular density. FINDINGS: There are no findings suspicious for malignancy. IMPRESSION: No mammographic evidence of malignancy. A result letter of this screening mammogram will be mailed directly to the patient. RECOMMENDATION: Screening mammogram in one year. (Code:SM-B-01Y) BI-RADS CATEGORY  1: Negative. Electronically Signed   By: Inocente Ast M.D.   On: 05/09/2024 08:23       Assessment & Plan:  History of iron deficiency anemia Assessment & Plan: Check cbc and iron studies.   Orders: -     CBC with Differential/Platelet -     IBC + Ferritin; Future  Benign hypertension Assessment &  Plan: Currently on hydrochlorothiazide  and carvedilol . Blood pressure as outlined. Have her spot check her pressures.  No changes in medication today. Check met b today.   Orders: -     Basic metabolic panel with GFR  Hypercholesteremia Assessment & Plan: The 10-year ASCVD risk score (Arnett DK, et al., 2019) is: 4.7%   Values used to calculate the score:     Age: 25 years      Clinically relevant sex: Female     Is Non-Hispanic African American: No     Diabetic: No     Tobacco smoker: No     Systolic Blood Pressure: 136 mmHg     Is BP treated: Yes     HDL Cholesterol: 57 mg/dL     Total Cholesterol: 242 mg/dL Low cholesterol diet and exercise.  Follow lipid panel.   Orders: -     Hepatic function panel  Hyperglycemia Assessment & Plan: Low carb diet and exercie. Check met b and A1c.    Hematuria, microscopic Assessment & Plan: Previously saw Dr Penne.  She was initially seen last year for a microscopic hematuria workup in 05/2014 which demonstrated an incidental 2 mm left upper pole nonobstructing stone on CT urogram, cystoscopy was negative. 2023 - urine - no red blood cells.    Runny nose Assessment & Plan: Symptoms began - scratchy throat. Runny nose. Dry cough. Mucinex DM. Consider - saline nasal spray/ steroid nasal spray. Follow.     Other orders -     Carvedilol ; Take 1 tablet (3.125 mg total) by mouth 2 (two) times daily with a meal.  Dispense: 180 tablet; Refill: 0 -     hydroCHLOROthiazide ; Take 1 capsule (12.5 mg total) by mouth daily.  Dispense: 90 capsule; Refill: 0 -     Potassium Chloride  Crys ER; Take 1 tablet (10 mEq total) by mouth daily.  Dispense: 90 tablet; Refill: 0     Allena Hamilton, MD "

## 2024-08-17 NOTE — Assessment & Plan Note (Signed)
Check cbc and iron studies.

## 2024-08-18 ENCOUNTER — Ambulatory Visit

## 2024-08-18 ENCOUNTER — Other Ambulatory Visit: Payer: Self-pay | Admitting: Internal Medicine

## 2024-08-18 DIAGNOSIS — D649 Anemia, unspecified: Secondary | ICD-10-CM | POA: Diagnosis not present

## 2024-08-18 LAB — IBC + FERRITIN
Ferritin: 41.7 ng/mL (ref 10.0–291.0)
Iron: 83 ug/dL (ref 42–145)
Saturation Ratios: 24.1 % (ref 20.0–50.0)
TIBC: 344.4 ug/dL (ref 250.0–450.0)
Transferrin: 246 mg/dL (ref 212.0–360.0)

## 2024-08-18 LAB — VITAMIN B12: Vitamin B-12: 1199 pg/mL — ABNORMAL HIGH (ref 211–911)

## 2024-08-18 NOTE — Progress Notes (Signed)
Order placed for add on labs.   °

## 2024-08-21 ENCOUNTER — Ambulatory Visit: Payer: Self-pay | Admitting: Internal Medicine

## 2024-08-27 ENCOUNTER — Encounter: Payer: Self-pay | Admitting: Internal Medicine

## 2024-08-27 DIAGNOSIS — R0989 Other specified symptoms and signs involving the circulatory and respiratory systems: Secondary | ICD-10-CM | POA: Insufficient documentation

## 2024-08-27 NOTE — Assessment & Plan Note (Signed)
 Previously saw Dr Penne.  She was initially seen last year for a microscopic hematuria workup in 05/2014 which demonstrated an incidental 2 mm left upper pole nonobstructing stone on CT urogram, cystoscopy was negative. 2023 - urine - no red blood cells.

## 2024-08-27 NOTE — Assessment & Plan Note (Signed)
 Currently on hydrochlorothiazide  and carvedilol . Blood pressure as outlined. Have her spot check her pressures.  No changes in medication today. Check met b today.

## 2024-08-27 NOTE — Assessment & Plan Note (Signed)
 Low carb diet and exercie. Check met b and A1c.

## 2024-08-27 NOTE — Assessment & Plan Note (Signed)
 The 10-year ASCVD risk score (Arnett DK, et al., 2019) is: 4.7%   Values used to calculate the score:     Age: 58 years     Clinically relevant sex: Female     Is Non-Hispanic African American: No     Diabetic: No     Tobacco smoker: No     Systolic Blood Pressure: 136 mmHg     Is BP treated: Yes     HDL Cholesterol: 57 mg/dL     Total Cholesterol: 242 mg/dL Low cholesterol diet and exercise.  Follow lipid panel.

## 2024-08-27 NOTE — Assessment & Plan Note (Signed)
 Symptoms began - scratchy throat. Runny nose. Dry cough. Mucinex DM. Consider - saline nasal spray/ steroid nasal spray. Follow.

## 2024-09-19 ENCOUNTER — Other Ambulatory Visit (HOSPITAL_COMMUNITY): Payer: Self-pay

## 2024-09-19 ENCOUNTER — Other Ambulatory Visit: Payer: Self-pay | Admitting: Internal Medicine

## 2024-09-19 ENCOUNTER — Other Ambulatory Visit: Payer: Self-pay

## 2024-09-19 MED ORDER — HYDROCHLOROTHIAZIDE 12.5 MG PO CAPS
12.5000 mg | ORAL_CAPSULE | Freq: Every day | ORAL | 0 refills | Status: AC
  Filled 2024-09-19: qty 90, 90d supply, fill #0

## 2024-10-12 ENCOUNTER — Other Ambulatory Visit

## 2024-12-28 ENCOUNTER — Encounter: Admitting: Internal Medicine
